# Patient Record
Sex: Female | Born: 1980 | Hispanic: Yes | Smoking: Former smoker
Health system: Southern US, Community
[De-identification: ages and names within clinical notes are randomized; demographics above are authoritative.]

## PROBLEM LIST (undated history)

## (undated) ENCOUNTER — Inpatient Hospital Stay (HOSPITAL_COMMUNITY): Payer: Self-pay

## (undated) DIAGNOSIS — J45909 Unspecified asthma, uncomplicated: Secondary | ICD-10-CM

## (undated) DIAGNOSIS — Z789 Other specified health status: Secondary | ICD-10-CM

## (undated) HISTORY — PX: NO PAST SURGERIES: SHX2092

## (undated) HISTORY — DX: Unspecified asthma, uncomplicated: J45.909

---

## 2012-03-25 ENCOUNTER — Ambulatory Visit (INDEPENDENT_AMBULATORY_CARE_PROVIDER_SITE_OTHER): Payer: Self-pay | Admitting: Emergency Medicine

## 2012-03-25 VITALS — BP 121/84 | HR 71 | Temp 98.3°F | Resp 16 | Ht 60.25 in | Wt 117.0 lb

## 2012-03-25 DIAGNOSIS — D229 Melanocytic nevi, unspecified: Secondary | ICD-10-CM

## 2012-03-25 DIAGNOSIS — D239 Other benign neoplasm of skin, unspecified: Secondary | ICD-10-CM

## 2012-03-25 NOTE — Progress Notes (Signed)
  Subjective:    Patient ID: Meghan Reeves, female    DOB: 10/20/1981, 31 y.o.   MRN: [id number]  HPI patient here with a two-year history of a mole on the left side of her face. She is here requesting removal of the mole the    Review of Systems     Objective:   Physical Exam physical exam reveals a 0.6 x 0.7 cm lesion of the left cheek.        Assessment & Plan:  Patient referred to dermatologist for removal

## 2013-11-22 ENCOUNTER — Ambulatory Visit: Payer: Self-pay

## 2015-07-13 ENCOUNTER — Ambulatory Visit (INDEPENDENT_AMBULATORY_CARE_PROVIDER_SITE_OTHER): Payer: Self-pay | Admitting: Family Medicine

## 2015-07-13 VITALS — BP 112/68 | HR 77 | Temp 97.9°F | Resp 17 | Ht 61.0 in | Wt 130.0 lb

## 2015-07-13 DIAGNOSIS — Z720 Tobacco use: Secondary | ICD-10-CM

## 2015-07-13 DIAGNOSIS — J069 Acute upper respiratory infection, unspecified: Secondary | ICD-10-CM

## 2015-07-13 DIAGNOSIS — R112 Nausea with vomiting, unspecified: Secondary | ICD-10-CM

## 2015-07-13 DIAGNOSIS — N910 Primary amenorrhea: Secondary | ICD-10-CM

## 2015-07-13 DIAGNOSIS — L700 Acne vulgaris: Secondary | ICD-10-CM

## 2015-07-13 DIAGNOSIS — N926 Irregular menstruation, unspecified: Secondary | ICD-10-CM

## 2015-07-13 DIAGNOSIS — F17200 Nicotine dependence, unspecified, uncomplicated: Secondary | ICD-10-CM

## 2015-07-13 LAB — POCT URINE PREGNANCY: PREG TEST UR: NEGATIVE

## 2015-07-13 MED ORDER — TRETINOIN 0.025 % EX GEL: Freq: Every day | CUTANEOUS | Status: DC

## 2015-07-13 MED ORDER — CLINDAMYCIN PHOSPHATE 1 % EX GEL: Freq: Two times a day (BID) | CUTANEOUS | Status: DC

## 2015-07-13 MED ORDER — PROMETHAZINE HCL 25 MG PO TABS: ORAL_TABLET | ORAL | Status: DC

## 2015-07-13 NOTE — Progress Notes (Addendum)
Nausea Subjective:  Patient ID: Meghan Reeves, female    DOB: 05-02-1981  Age: 34 y.o. MRN: [id number]  34 year old lady who comes in today complaining of nausea. This is Saturday. She vomited on Monday. She is persisted with having nausea. She has some throat irritation. Mild upper respiratory congestion and cough. She is a cigarette smoker, and says she would like something to help her get off of that. She is not employed. She has a boyfriend. She is on birth control pills. Last menstrual cycle was the middle of August. She does not have children. She has not had any more vomiting. No problems with her bowels or kidneys.  She is from [place] originally, has been in the [place] for 15 years. She speaks broken [place].   Objective:   Pleasant lady, healthy appearing, in no major distress. Her TMs are normal. Throat clear. Neck supple without significant nodes. Chest is clear to auscultation. Heart regular without murmurs gallops or arrhythmias. Abdomen is soft without masses or tenderness. No rashes. She denies having been febrile. Results for orders placed or performed in visit on 07/13/15  POCT urine pregnancy  Result Value Ref Range   Preg Test, Ur Negative Negative    Assessment & Plan:   Assessment:  Nonspecific nausea Probable mild URI or allergic rhinitis Tobacco use disorder  Plan:  Urged her to quit smoking Treat nausea symptomatically Major workup does not appear to be indicated at this time. Pregnancy test is negative.  Addendum: Patient wanted a refill on medications for her acne. I do not know what she took. She had a cream and a oral. I will treat her with some Retin-A.  There are no Patient Instructions on file for this visit.   HOPPER,DAVID, MD 07/13/2015

## 2015-07-13 NOTE — Addendum Note (Signed)
Addended by: Chastin Garlitz H on: 07/13/2015 10:21 AM   Modules accepted: Orders

## 2015-07-13 NOTE — Patient Instructions (Addendum)
Take promethazine 25 mg one half to one pill every 6 or 8 hours only when needed for nausea. This may cause you to feel a little sleepy or drowsy.  Take Claritin (loratadine) one daily if needed for headache congestion or runny nose  [employer] Spanish: 1-855-Dejelo-Ya ([phone])    Tomar prometazina 25 mg una mitad a una pastilla cada 6 u 8 horas solo cuando sea necesario para controlar las nuseas. Esto puede causar que se sienta un poco sueo o est aturtido.   Tomar Claritin (loratadine) uno diario si es necesario para el dolor de cabeza congestin o secrecin nasal   [employer]  Espaol: 1-855-Dejelo-Ya [phone])   Use the Retin-A gel on the face at bedtime.  Use the clindamycin gel on the face twice daily  Wash the face with soap and water twice daily   Utilice el gel Retin-A en la cara antes de acostarse.   Usar la clindamicina gel en [place] con jabn y [place]

## 2015-07-29 ENCOUNTER — Ambulatory Visit (INDEPENDENT_AMBULATORY_CARE_PROVIDER_SITE_OTHER): Payer: Self-pay | Admitting: Urgent Care

## 2015-07-29 VITALS — BP 102/70 | HR 74 | Temp 98.7°F | Resp 16 | Ht 61.0 in | Wt 130.4 lb

## 2015-07-29 DIAGNOSIS — F17200 Nicotine dependence, unspecified, uncomplicated: Secondary | ICD-10-CM

## 2015-07-29 DIAGNOSIS — Z308 Encounter for other contraceptive management: Secondary | ICD-10-CM

## 2015-07-29 DIAGNOSIS — Z720 Tobacco use: Secondary | ICD-10-CM

## 2015-07-29 DIAGNOSIS — Z3041 Encounter for surveillance of contraceptive pills: Secondary | ICD-10-CM

## 2015-07-29 DIAGNOSIS — Z30013 Encounter for initial prescription of injectable contraceptive: Secondary | ICD-10-CM | POA: Insufficient documentation

## 2015-07-29 MED ORDER — MEDROXYPROGESTERONE ACETATE 150 MG/ML IM SUSP
150.0000 mg | Freq: Once | INTRAMUSCULAR | Status: AC
Start: 2015-07-29 — End: 2015-07-29
  Administered 2015-07-29: 150 mg via INTRAMUSCULAR

## 2015-07-29 NOTE — Patient Instructions (Addendum)
Medroxyprogesterone injection [Contraceptive] Qu es este medicamento? Las inyecciones anticonceptivas de MEDROXIPROGESTERONA previenen Water quality scientist. Las [employer] brindarn control de la natalidad durante 3 meses. La Depo-subQ Provera 104 se utiliza tambin para tratar [employer] relacionado con endometriosis. Este medicamento puede ser utilizado para otros usos; si tiene alguna pregunta consulte con su proveedor de atencin mdica o con su farmacutico. MARCAS COMERCIALES DISPONIBLES: Depo-Provera, Depo-subQ Provera 104 Qu le debo informar a mi profesional de la salud antes de tomar este medicamento? Necesita saber si usted presenta alguno de los siguientes problemas o situaciones: -si consume alcohol con frecuencia -asma -enfermedad vascular o antecedente de cogulos sanguneos en los pulmones o las piernas -enfermedad de los [place], como osteoporosis -cncer de mama -diabetes -trastornos de la alimentacin (anorexia nerviosa o bulimia) -alta presin sangunea -infecciones por VIH o SIDA -enfermedad renal -enfermedad heptica -depresin mental -migraa -convulsiones -derrame cerebral -fuma tabaco -sangrado vaginal -una reaccin alrgica o inusual a la medroxiprogesterona, otras hormonas, otros medicamentos, alimentos, colorantes o conservantes -si est embarazada o buscando quedar embarazada -si est amamantando a un beb Cmo debo utilizar este medicamento? El anticonceptivo de Depo-Provera se inyecta por va intramuscular. La Depo-SubQ Provera 104 se inyecta por va subcutnea. Las [employer] un profesional de Technical sales engineer. Usted no puede estar embarazada antes de recibir una inyeccin. La inyeccin normalmente se aplica durante los primeros 5 das despus de comenzar un perodo menstrual o 6 semanas despus de un parto. Hable con su pediatra para informarse acerca del uso de este medicamento en nios. Puede requerir atencin especial. Estas inyecciones han sido usadas en nias  que han empezados a tener perodos [place]. Sobredosis: Pngase en contacto inmediatamente con un centro toxicolgico o una sala de urgencia si usted cree que haya tomado demasiado medicamento. ATENCIN: [employer] es solo para usted. No comparta este medicamento con nadie. Qu sucede si me olvido de una dosis? Trate de no olvidar ninguna dosis. Para mantener el control de natalidad necesitar una inyeccin cada 3 meses. Si no puede asistir a una cita, comunquese con su profesional de la salud para que se la [place]. Si espera ms de 13 semanas entre las inyecciones anticonceptivos de Depo-Provera o ms de 14 semanas entre inyecciones anticonceptivos de Depo-subQ Provera 104, puede quedarse [place]. Si no puede asistir a su cita utilice otro mtodo anticonceptivo. Tal vez deba hacerse una prueba de embarazo antes de recibir otra inyeccin. Qu puede interactuar con este medicamento? No tome esta medicina con ninguno de los siguientes medicamentos: -bosentano Esta medicina tambin puede interactuar con los siguientes medicamentos: -aminoglutethimide -antibiticos o medicamentos para infecciones, especialmente rifampicina, rifabutina, rifapentina y griseofulvina -aprepitant -barbitricos, tales como el fenobarbital o primidona -bexaroteno -carbamazepina -medicamentos para convulsiones, tales como etotona, felbamato, [place], [place], topiramato -modafinilo -hierba de [place] Puede ser que esta lista no menciona todas las posibles interacciones. Informe a su profesional de [employer]	[place] de [employer] productos a base de hierbas, medicamentos de [place] o suplementos nutritivos que est tomando. Si usted fuma, consume bebidas alcohlicas o si utiliza drogas ilegales, indqueselo tambin a su profesional de [employer]	[place]. Algunas sustancias pueden interactuar con su medicamento. A qu debo estar atento al usar [employer]? Este medicamento no la protege de la infeccin por VIH  (SIDA) ni de otras enfermedades de transmisin sexual. El uso de este producto puede provocar una prdida de calcio de sus huesos. La prdida de calcio puede provocar huesos dbiles (osteoporosis). Slo use este producto durante ms de 2 aos si otras formas  de anticonceptivos no son apropiados para usted. Mientre ms tiempo use este producto para el control de la natalidad, tendr ms riesgo de Insurance risk surveyor de [employer]. Consulte a su profesional de Pharmacist, hospital de cmo puede [employer] Corporation fuertes. Puede experimentar un cambio en el patrn de sangrado o periodos irregulares. Muchas mujeres dejan de tener periodos [employer]. Si recibe sus inyecciones a tiempo, la posibilidad de quedarse embarazada es muy baja. Si cree que podr [employer], visite a su profesional de la salud lo antes posible. Si desea quedar embarazada dentro del prximo ao, informe a su profesional de [employer]	[place]. El [place] de este medicamento puede perdurar durante mucho tiempo despus de recibir su ltima inyeccin. Qu efectos secundarios puedo tener al [employer] este medicamento? Efectos secundarios que debe informar a su mdico o a Barrister's clerk de la salud tan pronto como sea posible: -Chief of Staff como erupcin cutnea, picazn o urticarias, hinchazn de la cara, labios o lengua -secreciones o sensibilidad de las mamas -problemas respiratorios -cambios en la visin -depresin -sensacin de desmayos o mareos, cadas -fiebre -dolor en el abdomen, pecho, entrepierna o piernas -problemas de coordinacin, del habla, al caminar -cansancio o debilidad inusual -color amarillento de los ojos o la piel Efectos secundarios que, por lo general, no requieren Geophysical data processor (debe informarlos a mdico o a Barrister's clerk de la salud si persisten o si son molestos): -cne -retencin de lquidos e hinchazn -dolor de cabeza -perodos menstruales irregulares, manchando o ausencia de perodos  menstruales -dolor, picazn o reaccin cutnea temporal en el lugar de la inyeccin -aumento de peso Puede ser que esta lista no menciona todos los posibles efectos secundarios. Comunquese a su mdico por asesoramiento mdico [employer]. Usted puede informar los efectos secundarios a la FDA por telfono al 1-800-FDA-1088. Dnde debo guardar mi medicina? No se aplica en este caso. Un profesional de Probation officer las inyecciones. ATENCIN: Este folleto es un resumen. Puede ser que no cubra toda la posible informacin. Si usted tiene preguntas acerca de esta medicina, consulte con su mdico, su farmacutico o su profesional de Technical sales engineer.  2015, Elsevier/Gold Standard. (2009-01-07 15:09:00)

## 2015-07-29 NOTE — Progress Notes (Signed)
    MRN: [id number] DOB: 10/19/1981  Subjective:   Meghan Reeves is a 34 y.o. female presenting for OCP management. Patient has been taking Sprintec, she is consistent with her medication. She had a normal Pap smear completed several months ago. Patient was counseled on her smoking, currently half pack per day and she is sure to cut back. She is aware of OCPs and the increased risk for clots if she is also smoking. She denies shortness of breath, chest pain, heart racing, lower leg swelling, redness in her calf, calf pain. Of note, patient is unwilling to stop OCPs to her smoking but she does not want to get pregnant and has to be discrete given that her husband does not agree with her here. She would like counseling on what her options are as far as OCP in the setting of her smoking. Denies any other aggravating or relieving factors, no other questions or concerns.  Meghan Reeves has a current medication list which includes the following prescription(s): ibuprofen-diphenhydramine cit and norgestimate-ethinyl estradiol triphasic. Also has No Known Allergies.  Meghan Reeves  has no past medical history on file. Also  has no past surgical history on file.  Objective:   Vitals: BP 102/70 mmHg  Pulse 74  Temp(Src) 98.7 F (37.1 C) (Oral)  Resp 16  Ht 5\' 1"  (1.549 m)  Wt 130 lb 6.4 oz (59.149 kg)  BMI 24.65 kg/m2  SpO2 99%  LMP 07/27/2015  Physical Exam  Assessment and Plan :   1. Initiation of Depo Provera 2. Encounter for birth control pills maintenance - Due to patient's smoking I recommended we switch her from OCP to the Depo injection which carries a much lower risk with her smoking and is still effective contraception. She agreed to this especially since this will be more discrete since she doesn't want to get pregnant and is unwilling to have a conversation with her husband about this. She plans on continuing efforts to stop smoking. Follow-up in 3 months for cardioprotection.  3. Tobacco use  disorder - Offered help with smoking cessation but patient declined this today.  Meghan Eagles, PA-C [facility] [phone] 07/29/2015 2:25 PM

## 2015-09-23 ENCOUNTER — Ambulatory Visit (INDEPENDENT_AMBULATORY_CARE_PROVIDER_SITE_OTHER): Payer: Self-pay | Admitting: Family Medicine

## 2015-09-23 VITALS — BP 108/76 | HR 74 | Temp 98.4°F | Resp 16 | Ht 61.0 in | Wt 130.6 lb

## 2015-09-23 DIAGNOSIS — H1132 Conjunctival hemorrhage, left eye: Secondary | ICD-10-CM

## 2015-09-23 DIAGNOSIS — N912 Amenorrhea, unspecified: Secondary | ICD-10-CM

## 2015-09-23 NOTE — Progress Notes (Signed)
[facility] [address] [place]  [address] [place]  Date:  09/23/2015   Name:  Meghan Reeves   DOB:  05/05/1981   MRN:  [id number]  PCP:  No PCP Per Patient    Chief Complaint: Eye Problem   History of Present Illness:  Meghan Reeves is a 34 y.o. very pleasant female patient who presents with the following:  She notes a problem with he left eye for 3 days - no pain, swelling or discharge but she notes that the eye appears red No known injury She has coughed and sneezed some recently Her vision is normal.  She does not use any corrective lenses.  No light sensitivity- the eye is just red No one else at home has this She otherwise feels well She does not have any medication alergy  As an aside she notes that since she started depo she has not had her period.  Her husband does not know she is on depo and is asking why she has not menstruated,  She is not sure what to do   Patient Active Problem List   Diagnosis Date Noted  . Initiation of Depo Provera 07/29/2015    History reviewed. No pertinent past medical history.  History reviewed. No pertinent past surgical history.  Social History  Substance Use Topics  . Smoking status: Current Every Day Smoker  . Smokeless tobacco: Never Used  . Alcohol Use: No    History reviewed. No pertinent family history.  No Known Allergies  Medication list has been reviewed and updated.  Current Outpatient Prescriptions on File Prior to Visit  Medication Sig Dispense Refill  . Ibuprofen-Diphenhydramine Cit (ADVIL PM PO) Take by mouth.     No current facility-administered medications on file prior to visit.    Review of Systems:  As per HPI- otherwise negative.   Physical Examination: Filed Vitals:   09/23/15 1126  BP: 108/76  Pulse: 74  Temp: 98.4 F (36.9 C)  Resp: 16   Filed Vitals:   09/23/15 1126  Height: 5\' 1"  (1.549 m)  Weight: 130 lb 9.6 oz (59.24 kg)   Body mass  index is 24.69 kg/(m^2). Ideal Body Weight: Weight in (lb) to have BMI = 25: 132  GEN: WDWN, NAD, Non-toxic, A & O x 3, looks well HEENT: Atraumatic, Normocephalic. Neck supple. No masses, No LAD.  Bilateral TM wnl, oropharynx normal.  PEERL,EOMI.   Small subconjunctival hemorrhage on the left cornea, nasal aspect Limited fundoscopic exam wnl Ears and Nose: No external deformity. CV: RRR, No M/G/R. No JVD. No thrill. No extra heart sounds. PULM: CTA B, no wheezes, crackles, rhonchi. No retractions. No resp. distress. No accessory muscle use. EXTR: No c/c/e NEURO Normal gait.  PSYCH: Normally interactive. Conversant. Not depressed or anxious appearing.  Calm demeanor.    Assessment and Plan: Subconjunctival hemorrhage of left eye  Amenorrhea  Reassured that her subconjunctival hemorrhage is nothing to be concerned about and will self- resolve.  She will let us know if this does not resolve soon or if she has any sx Reassured her that it is normal to not menstruate while on depo.  I am not sure what she should tell her husband, but suggested she let him know that she has seen the MD and been told she is not mensurating to a hormonal issue which is indeed true.    Signed Lamar Blinks, MD

## 2015-10-29 ENCOUNTER — Ambulatory Visit (INDEPENDENT_AMBULATORY_CARE_PROVIDER_SITE_OTHER): Payer: Self-pay

## 2015-10-29 DIAGNOSIS — Z3042 Encounter for surveillance of injectable contraceptive: Secondary | ICD-10-CM

## 2015-10-29 LAB — POCT URINE PREGNANCY: Preg Test, Ur: NEGATIVE

## 2015-10-29 MED ORDER — MEDROXYPROGESTERONE ACETATE 150 MG/ML IM SUSY
150.0000 mg | PREFILLED_SYRINGE | Freq: Once | INTRAMUSCULAR | Status: AC
  Administered 2015-10-29: 150 mg via INTRAMUSCULAR

## 2015-10-29 MED ORDER — MEDROXYPROGESTERONE ACETATE 150 MG/ML IM SUSP: 150.0000 mg | Freq: Once | INTRAMUSCULAR | Status: DC

## 2015-10-29 NOTE — Progress Notes (Signed)
Pt one day late for Depo Provera. HCG done. Negative. Shot given and pt notified when to RTC for next injection

## 2016-01-28 ENCOUNTER — Ambulatory Visit (INDEPENDENT_AMBULATORY_CARE_PROVIDER_SITE_OTHER): Payer: Self-pay | Admitting: *Deleted

## 2016-01-28 DIAGNOSIS — Z3042 Encounter for surveillance of injectable contraceptive: Secondary | ICD-10-CM

## 2016-01-28 MED ORDER — MEDROXYPROGESTERONE ACETATE 150 MG/ML IM SUSY
150.0000 mg | PREFILLED_SYRINGE | Freq: Once | INTRAMUSCULAR | Status: AC
  Administered 2016-01-28: 150 mg via INTRAMUSCULAR

## 2016-01-28 NOTE — Progress Notes (Signed)
   Subjective:    Patient ID: Meghan Reeves, female    DOB: 1981-04-25, 35 y.o.   MRN: [id number]  HPI Pt here for Depo Provera 150 mg injection only.    Review of Systems     Objective:   Physical Exam        Assessment & Plan:

## 2016-08-07 ENCOUNTER — Telehealth: Payer: Self-pay | Admitting: *Deleted

## 2016-08-07 ENCOUNTER — Telehealth: Payer: Self-pay

## 2016-08-07 ENCOUNTER — Ambulatory Visit (INDEPENDENT_AMBULATORY_CARE_PROVIDER_SITE_OTHER): Payer: Self-pay | Admitting: Family Medicine

## 2016-08-07 VITALS — BP 110/80 | HR 70 | Temp 98.0°F | Resp 16 | Ht 61.0 in | Wt 141.0 lb

## 2016-08-07 DIAGNOSIS — R42 Dizziness and giddiness: Secondary | ICD-10-CM

## 2016-08-07 DIAGNOSIS — Z3169 Encounter for other general counseling and advice on procreation: Secondary | ICD-10-CM

## 2016-08-07 DIAGNOSIS — R350 Frequency of micturition: Secondary | ICD-10-CM

## 2016-08-07 DIAGNOSIS — F17200 Nicotine dependence, unspecified, uncomplicated: Secondary | ICD-10-CM

## 2016-08-07 DIAGNOSIS — N910 Primary amenorrhea: Secondary | ICD-10-CM

## 2016-08-07 DIAGNOSIS — N926 Irregular menstruation, unspecified: Secondary | ICD-10-CM

## 2016-08-07 LAB — POC MICROSCOPIC URINALYSIS (UMFC): Mucus: ABSENT

## 2016-08-07 LAB — POCT URINALYSIS DIP (MANUAL ENTRY)
BILIRUBIN UA: NEGATIVE
BILIRUBIN UA: NEGATIVE
Blood, UA: NEGATIVE
GLUCOSE UA: NEGATIVE
Leukocytes, UA: NEGATIVE
Nitrite, UA: NEGATIVE
Protein Ur, POC: NEGATIVE
SPEC GRAV UA: 1.01
Urobilinogen, UA: 0.2
pH, UA: 6.5

## 2016-08-07 LAB — POCT URINE PREGNANCY: Preg Test, Ur: NEGATIVE

## 2016-08-07 MED ORDER — PNV PRENATAL PLUS MULTIVIT+DHA 27-1 & 312 MG PO MISC: 1.0000 | Freq: Every day | ORAL | 3 refills | Status: DC

## 2016-08-07 NOTE — Progress Notes (Signed)
By signing my name below I, Tereasa Coop, attest that this documentation has been prepared under the direction and in the presence of Delman Cheadle, MD. Electonically Signed. Tereasa Coop, Scribe 08/07/2016 at 11:13 AM  Subjective:    Patient ID: Meghan Reeves, female    DOB: [birth year], 35 y.o.   MRN: [id number]  Chief Complaint  Patient presents with  . pregnancy test    HPI Meghan Reeves is a 35 y.o. female who presents to the [facility] for pregnancy test. Pt states that she has been trying to get pregnant for the past 3 months. Pt has been taking depo-provera shots for birth control. Pt's last depo-provera shot was 3 months ago.   Pt denies any prior pregnancy. Pt denies any prior attempt to get pregnant. Pt denies taking daily vitamins.  Pt has been feeling nauseous and dizzy for the past 8 days. dizziness is intermittent. Pt is dizzy currently. Pt wakes up feeling nauseous which remains constant throughout the day. Pt denies constipation, diarrhea, vomiting, or abd pain. Pt has not been eating well due to the nausea. Pt also denies any breast pain/tenderness. Pt reports urinary frequency. Pt denies any dysuria, vaginal pain, vaginal discharge or pelvic pain.   Pt has not drank any fluids today.   LNMP was 06/15/16. Pt's periods have been regular for the past 3 months. Pt's periods were also regular prior to starting depo-provera shots.   Pt denies regular exercise. Pt denies drinking alcohol.   Pt is an active smoker. Pt smoke on average 10 cigarettes a day. Pt states that she could be willing to quit, but currently does not want to.   Pt reports family history of asthma.   Pt is spanish speaking and professional medical translator present throughout history and exam.   There are no active problems to display for this patient.   No current outpatient prescriptions on file prior to visit.   No current facility-administered medications on file prior to  visit.     No Known Allergies  Depression screen [facility] 2/9 08/07/2016 07/13/2015  Decreased Interest 0 1  Down, Depressed, Hopeless 0 1  PHQ - 2 Score 0 2  Altered sleeping - 1  Tired, decreased energy - 1  Change in appetite - 1  Trouble concentrating - 1  Moving slowly or fidgety/restless - 1  Suicidal thoughts - 0  PHQ-9 Score - 7  Difficult doing work/chores - Somewhat difficult       Review of Systems  Constitutional: Negative for fever.  HENT: Negative.   Eyes: Negative.   Respiratory: Negative.   Cardiovascular: Negative.   Gastrointestinal: Positive for nausea. Negative for abdominal pain, constipation, diarrhea and vomiting.  Genitourinary: Positive for frequency. Negative for dysuria, flank pain, hematuria, urgency, vaginal discharge and vaginal pain.  Musculoskeletal: Negative.   Skin: Negative.   Neurological: Positive for dizziness.  Psychiatric/Behavioral: Negative.        Objective:   Physical Exam  Constitutional: She is oriented to person, place, and time. She appears well-developed and well-nourished. No distress.  HENT:  Head: Normocephalic and atraumatic.  Eyes: Conjunctivae are normal. Pupils are equal, round, and reactive to light.  Neck: Neck supple. No thyromegaly present.  Cardiovascular: Normal rate.   Pulmonary/Chest: Effort normal.  Abdominal: Soft. Normal appearance and bowel sounds are normal. She exhibits no distension and no mass. There is no tenderness. There is no rebound, no guarding and no CVA tenderness.  Musculoskeletal: Normal range of  motion.  Neurological: She is alert and oriented to person, place, and time.  Skin: Skin is warm and dry.  Psychiatric: She has a normal mood and affect. Her behavior is normal.  Nursing note and vitals reviewed.  BP 110/80 (BP Location: Right Arm, Patient Position: Sitting, Cuff Size: Normal)   Pulse 70   Temp 98 F (36.7 C) (Oral)   Resp 16   Ht 5\' 1"  (1.549 m)   Wt 141 lb (64 kg)   LMP  06/15/2016 (Approximate)   SpO2 99%   BMI 26.64 kg/m   Orthostatics negative.   Results for orders placed or performed in visit on 08/07/16  POCT urine pregnancy  Result Value Ref Range   Preg Test, Ur Negative Negative  POCT urinalysis dipstick  Result Value Ref Range   Color, UA yellow yellow   Clarity, UA clear clear   Glucose, UA negative negative   Bilirubin, UA negative negative   Ketones, POC UA negative negative   Spec Grav, UA 1.010    Blood, UA negative negative   pH, UA 6.5    Protein Ur, POC negative negative   Urobilinogen, UA 0.2    Nitrite, UA Negative Negative   Leukocytes, UA Negative Negative  POCT Microscopic Urinalysis ([facility])  Result Value Ref Range   WBC,UR,HPF,POC None None WBC/hpf   RBC,UR,HPF,POC None None RBC/hpf   Bacteria None None, Too numerous to count   Mucus Absent Absent   Epithelial Cells, UR Per Microscopy Many (A) None, Too numerous to count cells/hpf       Assessment & Plan:   1. Late period   2. Encounter for preconception consultation   3. Tobacco use disorder   4. Urinary frequency   5. Dizziness    Used Patent attorney. Advised smoking cessation and starting pnv prior to conception. Pt is having continuous nausea for the past wk and her menses is >1 wk late. However, as she just received her last Depo-Provera injection a little over 3 months ago I think it is more likely that her sxs are due to her coming off the Depo rather than early pregnancy.  Offered w/u of these sxs but as pt is self-pay, she elects for watchful waiting right now and will  RTC for further eval if sxs cont.  Orders Placed This Encounter  Procedures  . Orthostatic vital signs  . POCT urine pregnancy  . POCT urinalysis dipstick  . POCT Microscopic Urinalysis ([facility])    Meds ordered this encounter  Medications  . Prenatal Vit-Fe Fum-FA-Omega (PNV PRENATAL PLUS MULTIVIT+DHA) 27-1 & 312 MG MISC    Sig: Take 1 tablet by mouth daily.    Dispense:  90  each    Refill:  3    I personally performed the services described in this documentation, which was scribed in my presence. The recorded information has been reviewed and considered, and addended by me as needed.   Delman Cheadle, M.D.  [facility] [address] [place], [place] [address] [phone] phone [phone] fax  08/10/16 12:46 PM

## 2016-08-07 NOTE — Telephone Encounter (Signed)
Marya Amsler (the pharmacist) called from [place] wanting to know if pt needs this specific Prenatal Vit-Fe Fum-FA-Omega (PNV PRENATAL PLUS MULTIVIT+DHA) 27-1 & 312 MG MISC [id number] or if he can give her a similar medication. CB for Marya Amsler is [phone]

## 2016-08-07 NOTE — Patient Instructions (Addendum)
Preparacin para Water quality scientist (Preparing for Pregnancy) Antes de intentar quedar embarazada, haga una cita con el mdico (atencin previa a la concepcin). El objetivo es ayudarla a que tenga un embarazo saludable y sin Engineer, manufacturing. En la primera cita, el mdico:   Har un examen fsico completo, incluido un [place].  Har una historia clnica completa.  La aconsejar y la ayudar a Scientist, research (physical sciences) problema. LISTA DE VERIFICACIN PREVIA A LA CONCEPCIN A continuacin se incluye una lista de los temas bsicos que debe abarcar con el mdico en la visita previa a la concepcin:  Su historia clnica.  Informe al [employer] que padeci. Muchas de ellas pueden afectar el embarazo.  Incluya la historia clnica y los antecedentes familiares de su pareja.  Asegrese de haberse hecho estudios de deteccin de las enfermedades de transmisin sexual (ETS). Estas pueden afectar el [place], y, en algunos casos, pueden transmitirse al beb. Informe al mdico si tiene antecedentes de ETS.  Informe al mdico sobre cualquier problema previo que haya tenido en relacin con la concepcin o el embarazo.  Informe al [employer] que toma, entre ellos, los suplementos herbales y los medicamentos de [place].  Asegrese de [place]. Tal vez deba concertar ms citas.  Pregntele al mdico si debe recibir alguna vacuna o si hay alguna que debe evitar.  Dieta  Es muy importante seguir Ardelia Mems dieta equilibrada y saludable que contenga los nutrientes adecuados durante el [place].  Pdale al mdico que la ayude a Science writer un peso saludable antes del [place].  Si tiene sobrepeso, tiene ms riesgo de sufrir ciertas complicaciones, que incluyen hipertensin arterial, diabetes y Environmental education officer.  Si tiene [employer], es ms propensa a Best boy un beb con bajo peso al [employer].  Estilo de vida  Informe al [employer] cuestiones  relacionadas con el estilo de vida, por ejemplo, si consume alcohol o drogas, o si fuma.  Describa las sustancias dainas a las que puede estar expuesta en el trabajo o en su casa, entre otras, sustancias qumicas, plaguicidas y radiacin.  Salud mental  Informe al mdico si se ha sentido deprimida o ansiosa.  Informe al mdico si tiene antecedentes de consumo de drogas.  Informe al mdico si no se siente segura en su casa. INSTRUCCIONES PARA PREPARARSE PARA EL EMBARAZO EN EL HOGAR. Siga las indicaciones y los consejos del mdico.   Lleve un registro preciso de las [place], para que el mdico pueda determinar la fecha probable de parto con ms facilidad.  Empiece a tomar vitaminas prenatales y suplementos con cido flico diariamente. Tmelos como se lo haya indicado el mdico.  Consuma una dieta equilibrada. Pida ayuda a un asesor en nutricin si tiene preguntas o necesita ayuda.  Realice actividad fsica con regularidad. Intente realizar al menos 71minutos de [place] fsica todos o [employer].  Si fuma, deje de hacerlo.  No beba alcohol.  No consuma drogas.  Mantenga controlados los problemas mdicos, como la diabetes o hipertensin arterial.  Si tiene diabetes, haga lo siguiente:  Realice controles adecuados de la concentracin de Dispensing optician. Si tiene diabetes tipo1, aplquese varias dosis diarias de insulina. No use insulina en dosis dividida ni insulina premezclada.  Consulte a un oftalmlogo especialista en diabetes para que le realice un examen ocular.  El mdico debe evaluarla para detectar la presencia de enfermedades cardiovasculares.  Alcance un peso saludable. Si tiene sobrepeso u obesidad, adelgace con  la ayuda de un profesional mdico calificado, por ejemplo, un nutricionista matriculado. Pregntele al mdico cul es el rango de peso adecuado para usted. [place]? Puede estar embarazada si ha [employer] y no tuvo la [place]. Los sntomas de embarazo incipiente incluyen:   [place] leves.  Sangrado vaginal muy leve (manchado).  Cansancio poco frecuente.  Nuseas matinales. Si tiene alguno de [employer], hgase una prueba de embarazo casera. El objetivo de estas pruebas es Hydrographic surveyor la presencia de una hormona llamada gonadotropina corinica humana [facility]) en la orina. El organismo comienza a producir esta hormona al principio del Media planner. Estas pruebas son muy precisas. Espere por lo menos Management consultant de retraso de la [place]. Si el resultado es positivo, llame al mdico para Manufacturing systems engineer las citas para la atencin prenatal. QU DEBO HACER SI QUEDO EMBARAZADA?  Haga una cita con el mdico para la semana12 del [place], a ms tardar.  No fume. Fumar puede causar daos al beb.  No consuma bebidas alcohlicas. El alcohol se relaciona con ciertos defectos congnitos.  Evite los olores y las sustancias qumicas txicas.  Puede seguir teniendo [employer] si no le causan dolor u otros problemas, por ejemplo, sangrado vaginal.   Esta informacin no tiene Marine scientist el consejo del mdico. Asegrese de hacerle al mdico cualquier pregunta que tenga.   Document Released: 10/31/2013 Elsevier Interactive Patient Education 2016 [place] (Pregnancy and Smoking) Fumar durante el embarazo es daino para usted y el beb. La nicotina, una sustancia [place], el monxido de carbono y [place] otras sustancias txicas que usted inhala de un cigarrillo son transportadas a travs del torrente sanguneo hasta el beb. El humo del cigarrillo contiene ms de 2500sustancias qumicas. Se desconoce cul de ellas es nociva para el beb. Sin embargo, tanto la nicotina como el monxido de carbono causan problemas de Best boy. Fumar durante el embarazo aumenta el riesgo de:  Defectos congnitos en el beb, incluso defectos  cardacos.  Aborto espontneo y muerte fetal.  Nacimiento antes de completar las 37semanas de embarazo (parto prematuro).  Embarazo fuera del tero (embarazo ectpico).  Recubrimiento de la placenta sobre la abertura del tero (placenta previa).  Desprendimiento de la placenta antes del nacimiento del beb (desprendimiento placentario).  Rotura de la bolsa de lquido amnitico antes del trabajo de parto (ruptura prematura de las [place]). CMO AFECTA AL BEB [place]? Antes del nacimiento Fumar durante el embarazo:  Reduce el flujo sanguneo y el oxgeno que recibe el beb.  Aumenta la frecuencia cardaca del beb.  Reduce el desarrollo del beb dentro del tero (retraso del crecimiento intrauterino). Despus del nacimiento Los bebs que nacen de mujeres que fumaron durante el embarazo tienen ms probabilidades de tener un bajo peso al nacer. Tambin tienen riesgo de:  Problemas de salud graves, discapacidad crnica o Agricultural engineer (parlisis cerebral, retraso mental, problemas de aprendizaje) y [place].  Sndrome de muerte sbita del lactante (SMSL).  Problemas respiratorios y pulmonares. [place]?  Pdale ayuda a su mdico para dejar de fumar. Encontrar los siguientes recursos disponibles:  Psicoterapia.  Tratamiento psicolgico  Acupuntura.  Intervencin familiar.  Hipnosis.  Los suplementos de nicotina an no han sido estudiados como para saber si son seguros Solicitor. Debera considerarlos solamente cuando hayan fallado todos los dems mtodos, y deber usarlos bajo la supervisin atenta de su mdico.  Lneas telefnicas  de [place]. La lnea gratuita nacional en [place] para dejar de fumar es 1-800-QUIT NOW. PARA OBTENER MS INFORMACIN  Sociedad Estadounidense del Cncer ([place]): www.cancer.org  Asociacin Estadounidense del [place]n (American Heart  Association): www.heart.[place] ([place] ): www.cancer.gov  March of Dimes: www.marchofdimes.org   Esta informacin no tiene Marine scientist el consejo del mdico. Asegrese de hacerle al mdico cualquier pregunta que tenga.   Document Released: 04/13/2008 Document Revised: 10/31/2013 Elsevier Interactive Patient Education [employer].

## 2016-08-07 NOTE — Telephone Encounter (Signed)
Patient wanted Dr Brigitte Pulse to give her something for her nose and throat. I was advised by the doctor to tell patient to get Tylenol Sinus over the counter. Patient was called and advised.

## 2016-08-10 NOTE — Telephone Encounter (Signed)
Called pharm and OKd change to the prenatal vit that they stock.

## 2016-09-09 HISTORY — PX: APPENDECTOMY: SHX54

## 2016-09-28 ENCOUNTER — Inpatient Hospital Stay (HOSPITAL_COMMUNITY)
Admission: AD | Admit: 2016-09-28 | Discharge: 2016-10-01 | DRG: 340 | Disposition: A | Discharge status: Home / Self Care | Payer: Self-pay | Source: Ambulatory Visit | Attending: General Surgery | Admitting: General Surgery

## 2016-09-28 ENCOUNTER — Inpatient Hospital Stay (HOSPITAL_COMMUNITY): Payer: Self-pay

## 2016-09-28 ENCOUNTER — Encounter (HOSPITAL_COMMUNITY): Payer: Self-pay

## 2016-09-28 DIAGNOSIS — Z825 Family history of asthma and other chronic lower respiratory diseases: Secondary | ICD-10-CM

## 2016-09-28 DIAGNOSIS — D72829 Elevated white blood cell count, unspecified: Secondary | ICD-10-CM

## 2016-09-28 DIAGNOSIS — IMO0001 Reserved for inherently not codable concepts without codable children: Secondary | ICD-10-CM

## 2016-09-28 DIAGNOSIS — K3533 Acute appendicitis with perforation and localized peritonitis, with abscess: Secondary | ICD-10-CM | POA: Diagnosis present

## 2016-09-28 DIAGNOSIS — K352 Acute appendicitis with generalized peritonitis: Principal | ICD-10-CM | POA: Diagnosis present

## 2016-09-28 DIAGNOSIS — K3580 Unspecified acute appendicitis: Secondary | ICD-10-CM

## 2016-09-28 DIAGNOSIS — F17210 Nicotine dependence, cigarettes, uncomplicated: Secondary | ICD-10-CM | POA: Diagnosis present

## 2016-09-28 HISTORY — DX: Other specified health status: Z78.9

## 2016-09-28 LAB — CBC
HCT: 41.5 % (ref 36.0–46.0)
Hemoglobin: 14.9 g/dL (ref 12.0–15.0)
MCH: 32.2 pg (ref 26.0–34.0)
MCHC: 35.9 g/dL (ref 30.0–36.0)
MCV: 89.6 fL (ref 78.0–100.0)
PLATELETS: 280 10*3/uL (ref 150–400)
RBC: 4.63 MIL/uL (ref 3.87–5.11)
RDW: 12.4 % (ref 11.5–15.5)
WBC: 18.7 10*3/uL — AB (ref 4.0–10.5)

## 2016-09-28 LAB — URINALYSIS, ROUTINE W REFLEX MICROSCOPIC
BILIRUBIN URINE: NEGATIVE
Glucose, UA: NEGATIVE mg/dL
Hgb urine dipstick: NEGATIVE
KETONES UR: NEGATIVE mg/dL
LEUKOCYTES UA: NEGATIVE
NITRITE: NEGATIVE
PH: 5.5 (ref 5.0–8.0)
PROTEIN: NEGATIVE mg/dL
Specific Gravity, Urine: >1.03 — ABNORMAL HIGH (ref 1.005–1.030)

## 2016-09-28 LAB — POCT PREGNANCY, URINE: PREG TEST UR: NEGATIVE

## 2016-09-28 LAB — LIPASE, BLOOD: LIPASE: 17 U/L (ref 11–51)

## 2016-09-28 LAB — COMPREHENSIVE METABOLIC PANEL
ALT: 17 U/L (ref 14–54)
AST: 21 U/L (ref 15–41)
Albumin: 4.2 g/dL (ref 3.5–5.0)
Alkaline Phosphatase: 81 U/L (ref 38–126)
Anion gap: 8 (ref 5–15)
BUN: 11 mg/dL (ref 6–20)
CHLORIDE: 107 mmol/L (ref 101–111)
CO2: 22 mmol/L (ref 22–32)
CREATININE: 0.85 mg/dL (ref 0.44–1.00)
Calcium: 9.1 mg/dL (ref 8.9–10.3)
GFR calc non Af Amer: >60 mL/min (ref >60)
Glucose, Bld: 108 mg/dL — ABNORMAL HIGH (ref 65–99)
Potassium: 4 mmol/L (ref 3.5–5.1)
SODIUM: 137 mmol/L (ref 135–145)
Total Bilirubin: 0.4 mg/dL (ref 0.3–1.2)
Total Protein: 7.2 g/dL (ref 6.5–8.1)

## 2016-09-28 LAB — AMYLASE: Amylase: 92 U/L (ref 28–100)

## 2016-09-28 LAB — DIFFERENTIAL
Basophils Absolute: 0 10*3/uL (ref 0.0–0.1)
Basophils Relative: 0 %
Eosinophils Absolute: 0 10*3/uL (ref 0.0–0.7)
Eosinophils Relative: 0 %
LYMPHS PCT: 10 %
Lymphs Abs: 1.9 10*3/uL (ref 0.7–4.0)
MONO ABS: 0.4 10*3/uL (ref 0.1–1.0)
Monocytes Relative: 2 %
NEUTROS ABS: 15.9 10*3/uL — AB (ref 1.7–7.7)
Neutrophils Relative %: 87 %

## 2016-09-28 MED ORDER — ONDANSETRON 8 MG PO TBDP
8.0000 mg | ORAL_TABLET | Freq: Once | ORAL | Status: AC
  Administered 2016-09-28: 8 mg via ORAL
  Filled 2016-09-28: qty 1

## 2016-09-28 MED ORDER — KETOROLAC TROMETHAMINE 60 MG/2ML IM SOLN
60.0000 mg | Freq: Once | INTRAMUSCULAR | Status: AC
  Administered 2016-09-28: 60 mg via INTRAMUSCULAR
  Filled 2016-09-28: qty 2

## 2016-09-28 MED ORDER — IOPAMIDOL (ISOVUE-300) INJECTION 61%
30.0000 mL | INTRAVENOUS | Status: AC
  Administered 2016-09-28 (×2): 30 mL via ORAL

## 2016-09-28 MED ORDER — IOPAMIDOL (ISOVUE-300) INJECTION 61%
100.0000 mL | Freq: Once | INTRAVENOUS | Status: AC | PRN
Start: 2016-09-28 — End: 2016-09-28
  Administered 2016-09-28: 100 mL via INTRAVENOUS

## 2016-09-28 NOTE — MAU Note (Signed)
Pt c/o abdominal pain on her right side that starts in the upper quadrant and goes down to the lower right abdomen. The pain started today around 3pm. Pt states she has been vomiting since 5pm. Pt states she hasn't tried to eat anything today. Pt denies loose stools.

## 2016-09-28 NOTE — MAU Provider Note (Signed)
History     CSN: [id number]  Arrival date and time: 09/28/16 [id number]   First Provider Initiated Contact with Patient 09/28/16 2002      Chief Complaint  Patient presents with  . Abdominal Pain  . Emesis   Meghan Reeves is a 35 y.o. Female who presents with abdominal pain & vomiting that began this afternoon at 3 pm. Abdominal pain spreads from RUQ to RLQ.    Abdominal Pain  This is a new problem. The current episode started today. The onset quality is sudden. The problem occurs constantly. The problem has been unchanged. The pain is located in the RLQ and RUQ. The pain is at a severity of 8/10. The quality of the pain is cramping. The abdominal pain does not radiate. Associated symptoms include anorexia and vomiting. Pertinent negatives include no constipation, diarrhea, dysuria, fever, frequency or nausea. The pain is aggravated by palpation. The pain is relieved by nothing. Treatments tried: aspirin. The treatment provided no relief. There is no history of abdominal surgery, Crohn's disease, gallstones or irritable bowel syndrome.  Emesis   This is a new problem. The current episode started today. The problem occurs 5 to 10 times per day. The problem has been unchanged. The emesis has an appearance of bile. There has been no fever. Associated symptoms include abdominal pain and chills. Pertinent negatives include no diarrhea or fever. She has tried nothing for the symptoms.    History reviewed. No pertinent past medical history.  History reviewed. No pertinent surgical history.  History reviewed. No pertinent family history.  Social History  Substance Use Topics  . Smoking status: Current Every Day Smoker    Packs/day: 0.25    Years: 10.00    Types: Cigarettes  . Smokeless tobacco: Never Used  . Alcohol use No    Allergies: No Known Allergies  Prescriptions Prior to Admission  Medication Sig Dispense Refill Last Dose  . ibuprofen (ADVIL,MOTRIN) 200 MG tablet Take 600 mg  by mouth every 6 (six) hours as needed for moderate pain.   09/28/2016 at 0200  . Prenatal Vit-Fe Fum-FA-Omega (PNV PRENATAL PLUS MULTIVIT+DHA) 27-1 & 312 MG MISC Take 1 tablet by mouth daily. (Patient not taking: Reported on 09/28/2016) 90 each 3 Not Taking at Unknown time    Review of Systems  Constitutional: Positive for chills. Negative for fever.  Gastrointestinal: Positive for abdominal pain, anorexia and vomiting. Negative for constipation, diarrhea and nausea.  Genitourinary: Negative.  Negative for dysuria and frequency.   Physical Exam   Blood pressure 127/83, pulse 78, temperature 98.2 F (36.8 C), temperature source Oral, resp. rate 17, last menstrual period 09/08/2016, SpO2 99 %, unknown if currently breastfeeding.  Physical Exam  Nursing note and vitals reviewed. Constitutional: She is oriented to person, place, and time. She appears well-developed and well-nourished. No distress.  HENT:  Head: Normocephalic and atraumatic.  Eyes: Conjunctivae are normal. Right eye exhibits no discharge. Left eye exhibits no discharge. No scleral icterus.  Neck: Normal range of motion.  Cardiovascular: Normal rate, regular rhythm and normal heart sounds.   No murmur heard. Respiratory: Effort normal and breath sounds normal. No respiratory distress. She has no wheezes.  GI: Soft. Bowel sounds are normal. She exhibits no distension. There is tenderness in the right lower quadrant. There is guarding (RLQ). There is no rebound and negative Murphy's sign.  Neurological: She is alert and oriented to person, place, and time.  Skin: Skin is warm and dry. She is not diaphoretic.  Psychiatric: She has a normal mood and affect. Her behavior is normal. Judgment and thought content normal.    MAU Course  Procedures Results for orders placed or performed during the hospital encounter of 09/28/16 (from the past 24 hour(s))  Pregnancy, urine POC     Status: None   Collection Time: 09/28/16  7:37 PM   Result Value Ref Range   Preg Test, Ur NEGATIVE NEGATIVE  Urinalysis, Routine w reflex microscopic (not at [facility])     Status: Abnormal   Collection Time: 09/28/16  7:38 PM  Result Value Ref Range   Color, Urine YELLOW YELLOW   APPearance CLEAR CLEAR   Specific Gravity, Urine >1.030 (H) 1.005 - 1.030   pH 5.5 5.0 - 8.0   Glucose, UA NEGATIVE NEGATIVE mg/dL   Hgb urine dipstick NEGATIVE NEGATIVE   Bilirubin Urine NEGATIVE NEGATIVE   Ketones, ur NEGATIVE NEGATIVE mg/dL   Protein, ur NEGATIVE NEGATIVE mg/dL   Nitrite NEGATIVE NEGATIVE   Leukocytes, UA NEGATIVE NEGATIVE  CBC     Status: Abnormal   Collection Time: 09/28/16  8:35 PM  Result Value Ref Range   WBC 18.7 (H) 4.0 - 10.5 K/uL   RBC 4.63 3.87 - 5.11 MIL/uL   Hemoglobin 14.9 12.0 - 15.0 g/dL   HCT 41.5 36.0 - 46.0 %   MCV 89.6 78.0 - 100.0 fL   MCH 32.2 26.0 - 34.0 pg   MCHC 35.9 30.0 - 36.0 g/dL   RDW 12.4 11.5 - 15.5 %   Platelets 280 150 - 400 K/uL  Comprehensive metabolic panel     Status: Abnormal   Collection Time: 09/28/16  8:35 PM  Result Value Ref Range   Sodium 137 135 - 145 mmol/L   Potassium 4.0 3.5 - 5.1 mmol/L   Chloride 107 101 - 111 mmol/L   CO2 22 22 - 32 mmol/L   Glucose, Bld 108 (H) 65 - 99 mg/dL   BUN 11 6 - 20 mg/dL   Creatinine, Ser 0.85 0.44 - 1.00 mg/dL   Calcium 9.1 8.9 - 10.3 mg/dL   Total Protein 7.2 6.5 - 8.1 g/dL   Albumin 4.2 3.5 - 5.0 g/dL   AST 21 15 - 41 U/L   ALT 17 14 - 54 U/L   Alkaline Phosphatase 81 38 - 126 U/L   Total Bilirubin 0.4 0.3 - 1.2 mg/dL   GFR calc non Af Amer >60 >60 mL/min   GFR calc Af Amer >60 >60 mL/min   Anion gap 8 5 - 15  Amylase     Status: None   Collection Time: 09/28/16  8:35 PM  Result Value Ref Range   Amylase 92 28 - 100 U/L  Lipase, blood     Status: None   Collection Time: 09/28/16  8:35 PM  Result Value Ref Range   Lipase 17 11 - 51 U/L   Ct Abdomen Pelvis W Contrast  Result Date: 09/29/2016 CLINICAL DATA:  Right upper quadrant and  right lower quadrant abdominal pain with vomiting beginning this afternoon. EXAM: CT ABDOMEN AND PELVIS WITH CONTRAST TECHNIQUE: Multidetector CT imaging of the abdomen and pelvis was performed using the standard protocol following bolus administration of intravenous contrast. CONTRAST:  161mL ISOVUE-300 IOPAMIDOL (ISOVUE-300) INJECTION 61%, 1 ISOVUE-300 IOPAMIDOL (ISOVUE-300) INJECTION 61% COMPARISON:  None. FINDINGS: Lower chest: Motion artifact.  No acute abnormalities suggested. Hepatobiliary: No focal liver abnormality is seen. No gallstones, gallbladder wall thickening, or biliary dilatation. Pancreas: Unremarkable. No pancreatic ductal dilatation or  surrounding inflammatory changes. Spleen: Normal in size without focal abnormality. Adrenals/Urinary Tract: Adrenal glands are unremarkable. Kidneys are normal, without renal calculi, focal lesion, or hydronephrosis. Bladder is unremarkable. Stomach/Bowel: Appendix is fluid-filled and distended with diameter measured at 11.5 mm. Periappendiceal edema and stranding is present. Changes are consistent with acute appendicitis. No abscess. Stomach is unremarkable in appearance. Small bowel are mostly decompressed. No colonic distention or wall thickening. Vascular/Lymphatic: No significant vascular findings are present. No enlarged abdominal or pelvic lymph nodes. Reproductive: Probable involuting cyst in the right ovary. Uterus and ovaries are otherwise unremarkable. No free fluid in the pelvis. Other: No free air or free fluid in the abdomen. Abdominal wall musculature appears intact. Musculoskeletal: No acute or significant osseous findings. IMPRESSION: Changes of acute appendicitis.  No abscess. Electronically Signed   By: Lucienne Capers M.D.   On: 09/29/2016 00:06     MDM UPT negative VSS CBC, CMP, amylase, lipase zofran 8 mg ODT & toradol 60 mg IM Will send for CT scan d/t leukocytosis & pain location Differential added on  Waiting for pt to go to  CT. Care turned over to [place], NP 09/28/2016 9:53 PM   0016: D/W Dr[facility] Winfred Leeds at [facility]. He accepts transfer.  [id number]: D/W the patient the results of the CT and need for transfer to a facility that can provide the care she needs. She agreeable, and verbalizes understanding. Recommended [facility], but she declines at this time. She would like to go by private vehicle.   Assessment and Plan  Acute appendicitis Transfer to [facility] by private vehicle for further management

## 2016-09-29 ENCOUNTER — Encounter (HOSPITAL_COMMUNITY): Payer: Self-pay | Admitting: Oncology

## 2016-09-29 ENCOUNTER — Observation Stay (HOSPITAL_COMMUNITY): Payer: Self-pay | Admitting: Certified Registered Nurse Anesthetist

## 2016-09-29 ENCOUNTER — Encounter (HOSPITAL_COMMUNITY): Admission: AD | Disposition: A | Discharge status: Home / Self Care | Payer: Self-pay | Source: Ambulatory Visit

## 2016-09-29 DIAGNOSIS — K3580 Unspecified acute appendicitis: Secondary | ICD-10-CM

## 2016-09-29 DIAGNOSIS — K3533 Acute appendicitis with perforation and localized peritonitis, with abscess: Secondary | ICD-10-CM | POA: Diagnosis present

## 2016-09-29 HISTORY — PX: LAPAROSCOPIC APPENDECTOMY: SHX408

## 2016-09-29 SURGERY — APPENDECTOMY, LAPAROSCOPIC
Anesthesia: General | Site: Abdomen

## 2016-09-29 MED ORDER — ONDANSETRON 4 MG PO TBDP: 4.0000 mg | ORAL_TABLET | Freq: Four times a day (QID) | ORAL | Status: DC | PRN

## 2016-09-29 MED ORDER — LACTATED RINGERS IR SOLN
Status: DC | PRN
  Administered 2016-09-29: 3000 mL

## 2016-09-29 MED ORDER — PIPERACILLIN-TAZOBACTAM 3.375 G IVPB
INTRAVENOUS | Status: AC
  Filled 2016-09-29: qty 50

## 2016-09-29 MED ORDER — SUCCINYLCHOLINE CHLORIDE 20 MG/ML IJ SOLN
INTRAMUSCULAR | Status: DC | PRN
  Administered 2016-09-29: 100 mg via INTRAVENOUS

## 2016-09-29 MED ORDER — LIDOCAINE 2% (20 MG/ML) 5 ML SYRINGE
INTRAMUSCULAR | Status: AC
  Filled 2016-09-29: qty 5

## 2016-09-29 MED ORDER — LIDOCAINE HCL (CARDIAC) 20 MG/ML IV SOLN
INTRAVENOUS | Status: DC | PRN
  Administered 2016-09-29: 100 mg via INTRAVENOUS

## 2016-09-29 MED ORDER — ESMOLOL HCL 100 MG/10ML IV SOLN
INTRAVENOUS | Status: AC
  Filled 2016-09-29: qty 10

## 2016-09-29 MED ORDER — KCL IN DEXTROSE-NACL 20-5-0.9 MEQ/L-%-% IV SOLN
INTRAVENOUS | Status: DC
  Administered 2016-09-29: 06:00:00 via INTRAVENOUS
  Filled 2016-09-29 (×2): qty 1000

## 2016-09-29 MED ORDER — MORPHINE SULFATE (PF) 2 MG/ML IV SOLN
1.0000 mg | INTRAVENOUS | Status: DC | PRN
  Administered 2016-09-29 (×2): 2 mg via INTRAVENOUS
  Filled 2016-09-29 (×2): qty 1

## 2016-09-29 MED ORDER — DEXAMETHASONE SODIUM PHOSPHATE 10 MG/ML IJ SOLN
INTRAMUSCULAR | Status: DC | PRN
  Administered 2016-09-29: 10 mg via INTRAVENOUS

## 2016-09-29 MED ORDER — ONDANSETRON HCL 4 MG/2ML IJ SOLN: 4.0000 mg | Freq: Four times a day (QID) | INTRAMUSCULAR | Status: DC | PRN

## 2016-09-29 MED ORDER — PANTOPRAZOLE SODIUM 40 MG IV SOLR: 40.0000 mg | Freq: Every day | INTRAVENOUS | Status: DC

## 2016-09-29 MED ORDER — SUCCINYLCHOLINE CHLORIDE 200 MG/10ML IV SOSY
PREFILLED_SYRINGE | INTRAVENOUS | Status: AC
  Filled 2016-09-29: qty 10

## 2016-09-29 MED ORDER — KCL IN DEXTROSE-NACL 20-5-0.45 MEQ/L-%-% IV SOLN
INTRAVENOUS | Status: DC
  Administered 2016-09-29 – 2016-09-30 (×2): via INTRAVENOUS
  Filled 2016-09-29 (×4): qty 1000

## 2016-09-29 MED ORDER — PROPOFOL 10 MG/ML IV BOLUS
INTRAVENOUS | Status: DC | PRN
  Administered 2016-09-29: 150 mg via INTRAVENOUS

## 2016-09-29 MED ORDER — SIMETHICONE 80 MG PO CHEW: 40.0000 mg | CHEWABLE_TABLET | Freq: Four times a day (QID) | ORAL | Status: DC | PRN

## 2016-09-29 MED ORDER — DEXTROSE 5 % IV SOLN
2.0000 g | Freq: Once | INTRAVENOUS | Status: AC
  Administered 2016-09-29: 2 g via INTRAVENOUS
  Filled 2016-09-29: qty 2

## 2016-09-29 MED ORDER — 0.9 % SODIUM CHLORIDE (POUR BTL) OPTIME
TOPICAL | Status: DC | PRN
  Administered 2016-09-29: 1000 mL

## 2016-09-29 MED ORDER — PIPERACILLIN-TAZOBACTAM 3.375 G IVPB
3.3750 g | Freq: Three times a day (TID) | INTRAVENOUS | Status: DC
  Administered 2016-09-29 – 2016-10-01 (×5): 3.375 g via INTRAVENOUS
  Filled 2016-09-29 (×4): qty 50

## 2016-09-29 MED ORDER — ROCURONIUM BROMIDE 100 MG/10ML IV SOLN
INTRAVENOUS | Status: DC | PRN
  Administered 2016-09-29: 30 mg via INTRAVENOUS
  Administered 2016-09-29: 5 mg via INTRAVENOUS

## 2016-09-29 MED ORDER — DIPHENHYDRAMINE HCL 12.5 MG/5ML PO ELIX: 12.5000 mg | ORAL_SOLUTION | Freq: Four times a day (QID) | ORAL | Status: DC | PRN

## 2016-09-29 MED ORDER — HYDROMORPHONE HCL 1 MG/ML IJ SOLN
0.5000 mg | INTRAMUSCULAR | Status: DC | PRN
  Administered 2016-09-29 – 2016-10-01 (×5): 1 mg via INTRAVENOUS
  Filled 2016-09-29 (×5): qty 1

## 2016-09-29 MED ORDER — LACTATED RINGERS IV SOLN
INTRAVENOUS | Status: DC
  Administered 2016-09-29 (×2): via INTRAVENOUS

## 2016-09-29 MED ORDER — LIDOCAINE-EPINEPHRINE 1 %-1:100000 IJ SOLN
INTRAMUSCULAR | Status: AC
  Filled 2016-09-29: qty 1

## 2016-09-29 MED ORDER — ONDANSETRON HCL 4 MG/2ML IJ SOLN
INTRAMUSCULAR | Status: AC
  Filled 2016-09-29: qty 2

## 2016-09-29 MED ORDER — FENTANYL CITRATE (PF) 100 MCG/2ML IJ SOLN
INTRAMUSCULAR | Status: DC | PRN
  Administered 2016-09-29: 50 ug via INTRAVENOUS
  Administered 2016-09-29: 100 ug via INTRAVENOUS
  Administered 2016-09-29 (×2): 50 ug via INTRAVENOUS

## 2016-09-29 MED ORDER — PROPOFOL 10 MG/ML IV BOLUS
INTRAVENOUS | Status: AC
  Filled 2016-09-29: qty 20

## 2016-09-29 MED ORDER — HYDROMORPHONE HCL 1 MG/ML IJ SOLN: 0.2500 mg | INTRAMUSCULAR | Status: DC | PRN

## 2016-09-29 MED ORDER — SUGAMMADEX SODIUM 200 MG/2ML IV SOLN
INTRAVENOUS | Status: DC | PRN
  Administered 2016-09-29: 150 mg via INTRAVENOUS

## 2016-09-29 MED ORDER — OXYCODONE HCL 5 MG PO TABS
5.0000 mg | ORAL_TABLET | ORAL | Status: DC | PRN
  Administered 2016-09-29 – 2016-09-30 (×4): 5 mg via ORAL
  Administered 2016-10-01: 10 mg via ORAL
  Filled 2016-09-29 (×2): qty 1
  Filled 2016-09-29: qty 2
  Filled 2016-09-29 (×2): qty 1

## 2016-09-29 MED ORDER — MIDAZOLAM HCL 5 MG/5ML IJ SOLN
INTRAMUSCULAR | Status: DC | PRN
  Administered 2016-09-29 (×2): 1 mg via INTRAVENOUS

## 2016-09-29 MED ORDER — MIDAZOLAM HCL 2 MG/2ML IJ SOLN
INTRAMUSCULAR | Status: AC
  Filled 2016-09-29: qty 2

## 2016-09-29 MED ORDER — ALBUTEROL SULFATE HFA 108 (90 BASE) MCG/ACT IN AERS
INHALATION_SPRAY | RESPIRATORY_TRACT | Status: DC | PRN
  Administered 2016-09-29: 4 via RESPIRATORY_TRACT

## 2016-09-29 MED ORDER — ENOXAPARIN SODIUM 40 MG/0.4ML ~~LOC~~ SOLN
40.0000 mg | SUBCUTANEOUS | Status: DC
  Administered 2016-09-30 – 2016-10-01 (×2): 40 mg via SUBCUTANEOUS
  Filled 2016-09-29 (×2): qty 0.4

## 2016-09-29 MED ORDER — DEXAMETHASONE SODIUM PHOSPHATE 10 MG/ML IJ SOLN
INTRAMUSCULAR | Status: AC
  Filled 2016-09-29: qty 1

## 2016-09-29 MED ORDER — DIPHENHYDRAMINE HCL 50 MG/ML IJ SOLN: 12.5000 mg | Freq: Four times a day (QID) | INTRAMUSCULAR | Status: DC | PRN

## 2016-09-29 MED ORDER — BUPIVACAINE HCL (PF) 0.25 % IJ SOLN
INTRAMUSCULAR | Status: AC
  Filled 2016-09-29: qty 30

## 2016-09-29 MED ORDER — LIDOCAINE-EPINEPHRINE 1 %-1:100000 IJ SOLN
INTRAMUSCULAR | Status: DC | PRN
  Administered 2016-09-29: 20 mL

## 2016-09-29 MED ORDER — PIPERACILLIN-TAZOBACTAM 3.375 G IVPB
3.3750 g | Freq: Three times a day (TID) | INTRAVENOUS | Status: DC
  Administered 2016-09-29 (×2): 3.375 g via INTRAVENOUS
  Filled 2016-09-29: qty 50

## 2016-09-29 MED ORDER — METRONIDAZOLE IN NACL 5-0.79 MG/ML-% IV SOLN
500.0000 mg | Freq: Once | INTRAVENOUS | Status: AC
  Administered 2016-09-29: 500 mg via INTRAVENOUS
  Filled 2016-09-29: qty 100

## 2016-09-29 MED ORDER — ROCURONIUM BROMIDE 50 MG/5ML IV SOSY
PREFILLED_SYRINGE | INTRAVENOUS | Status: AC
  Filled 2016-09-29: qty 5

## 2016-09-29 MED ORDER — ALBUTEROL SULFATE HFA 108 (90 BASE) MCG/ACT IN AERS
INHALATION_SPRAY | RESPIRATORY_TRACT | Status: AC
  Filled 2016-09-29: qty 6.7

## 2016-09-29 MED ORDER — FENTANYL CITRATE (PF) 250 MCG/5ML IJ SOLN
INTRAMUSCULAR | Status: AC
  Filled 2016-09-29: qty 5

## 2016-09-29 MED ORDER — ONDANSETRON HCL 4 MG/2ML IJ SOLN
INTRAMUSCULAR | Status: DC | PRN
  Administered 2016-09-29: 4 mg via INTRAVENOUS

## 2016-09-29 MED ORDER — ACETAMINOPHEN 500 MG PO TABS
1000.0000 mg | ORAL_TABLET | Freq: Four times a day (QID) | ORAL | Status: DC
  Administered 2016-09-29 – 2016-10-01 (×8): 1000 mg via ORAL
  Filled 2016-09-29 (×8): qty 2

## 2016-09-29 SURGICAL SUPPLY — 37 items
APPLIER CLIP 5 13 M/L LIGAMAX5 (MISCELLANEOUS)
APPLIER CLIP ROT 10 11.4 M/L (STAPLE)
CABLE HIGH FREQUENCY MONO STRZ (ELECTRODE) IMPLANT
CLIP APPLIE 5 13 M/L LIGAMAX5 (MISCELLANEOUS) IMPLANT
CLIP APPLIE ROT 10 11.4 M/L (STAPLE) IMPLANT
CLOSURE WOUND 1/2 X4 (GAUZE/BANDAGES/DRESSINGS)
COVER SURGICAL LIGHT HANDLE (MISCELLANEOUS) ×3 IMPLANT
CUTTER FLEX LINEAR 45M (STAPLE) IMPLANT
DECANTER SPIKE VIAL GLASS SM (MISCELLANEOUS) ×3 IMPLANT
DERMABOND ADVANCED (GAUZE/BANDAGES/DRESSINGS) ×2
DERMABOND ADVANCED .7 DNX12 (GAUZE/BANDAGES/DRESSINGS) ×1 IMPLANT
DRAPE LAPAROSCOPIC ABDOMINAL (DRAPES) ×3 IMPLANT
ELECT PENCIL ROCKER SW 15FT (MISCELLANEOUS) IMPLANT
ELECT REM PT RETURN 9FT ADLT (ELECTROSURGICAL) ×3
ELECTRODE REM PT RTRN 9FT ADLT (ELECTROSURGICAL) ×1 IMPLANT
GLOVE BIO SURGEON STRL SZ7.5 (GLOVE) ×3 IMPLANT
GLOVE INDICATOR 8.0 STRL GRN (GLOVE) ×3 IMPLANT
GOWN STRL REUS W/TWL LRG LVL3 (GOWN DISPOSABLE) ×3 IMPLANT
GOWN STRL REUS W/TWL XL LVL3 (GOWN DISPOSABLE) ×6 IMPLANT
IRRIG SUCT STRYKERFLOW 2 WTIP (MISCELLANEOUS) ×3
IRRIGATION SUCT STRKRFLW 2 WTP (MISCELLANEOUS) ×1 IMPLANT
IV LACTATED RINGERS 1000ML (IV SOLUTION) ×3 IMPLANT
KIT BASIN OR (CUSTOM PROCEDURE TRAY) ×3 IMPLANT
L-HOOK LAP DISP 36CM (ELECTROSURGICAL)
LHOOK LAP DISP 36CM (ELECTROSURGICAL) IMPLANT
POUCH SPECIMEN RETRIEVAL 10MM (ENDOMECHANICALS) ×3 IMPLANT
RELOAD 45 VASCULAR/THIN (ENDOMECHANICALS) IMPLANT
RELOAD STAPLE TA45 3.5 REG BLU (ENDOMECHANICALS) IMPLANT
SHEARS HARMONIC ACE PLUS 36CM (ENDOMECHANICALS) ×3 IMPLANT
STRIP CLOSURE SKIN 1/2X4 (GAUZE/BANDAGES/DRESSINGS) IMPLANT
SUT MNCRL AB 4-0 PS2 18 (SUTURE) ×3 IMPLANT
TOWEL OR 17X26 10 PK STRL BLUE (TOWEL DISPOSABLE) ×3 IMPLANT
TRAY FOLEY W/METER SILVER 16FR (SET/KITS/TRAYS/PACK) ×3 IMPLANT
TRAY LAPAROSCOPIC (CUSTOM PROCEDURE TRAY) ×3 IMPLANT
TROCAR XCEL BLUNT TIP 100MML (ENDOMECHANICALS) ×3 IMPLANT
TROCAR XCEL NON-BLD 5MMX100MML (ENDOMECHANICALS) ×6 IMPLANT
TUBING INSUF HEATED (TUBING) ×3 IMPLANT

## 2016-09-29 NOTE — Anesthesia Procedure Notes (Signed)
Procedure Name: Intubation Date/Time: 09/29/2016 2:26 PM Performed by: Maxwell Caul Pre-anesthesia Checklist: Patient identified, Emergency Drugs available, Suction available and Patient being monitored Patient Re-evaluated:Patient Re-evaluated prior to inductionOxygen Delivery Method: Circle system utilized Preoxygenation: Pre-oxygenation with 100% oxygen Intubation Type: IV induction, Rapid sequence and Cricoid Pressure applied Laryngoscope Size: Mac and 4 Grade View: Grade I Tube type: Oral Tube size: 7.5 mm Number of attempts: 1 Airway Equipment and Method: Stylet and Oral airway Placement Confirmation: ETT inserted through vocal cords under direct vision,  positive ETCO2 and breath sounds checked- equal and bilateral Secured at: 21 cm Tube secured with: Tape Dental Injury: Teeth and Oropharynx as per pre-operative assessment

## 2016-09-29 NOTE — ED Notes (Signed)
Bed: WA03 Expected date:  Expected time:  Means of arrival:  Comments: Sharmon Leyden

## 2016-09-29 NOTE — Interval H&P Note (Signed)
History and Physical Interval Note:  09/29/2016 1:55 PM  Meghan Reeves  has presented today for surgery, with the diagnosis of appendicitis  The various methods of treatment have been discussed with the patient and family. After consideration of risks, benefits and other options for treatment, the patient has consented to  Procedure(s): APPENDECTOMY LAPAROSCOPIC (N/A) as a surgical intervention .  The patient's history has been reviewed, patient examined, no change in status, stable for surgery.  I have reviewed the patient's chart and labs.  Questions were answered to the patient's satisfaction.    Discussed with pt via video interpreter Jacob Moores [phone]  We discussed the etiology and management of acute appendicitis. We discussed operative and nonoperative management.  I recommended operative management along with IV antibiotics.  We discussed laparoscopic appendectomy. We discussed the risk and benefits of surgery including but not limited to bleeding, infection, injury to surrounding structures, need to convert to an open procedure, blood clot formation, post operative abscess or wound infection, staple line complications such as leak or bleeding, hernia formation, post operative ileus, need for additional procedures, anesthesia complications, and the typical postoperative course. I explained that the patient should expect a good improvement in their symptoms.  Leighton Ruff. Redmond Pulling, MD, [place], Bariatric, & Minimally Invasive Surgery [facility], [place]  [facility]

## 2016-09-29 NOTE — Op Note (Signed)
Meghan Reeves [id number] [birth year] 09/29/2016  Appendectomy, Lap, Procedure Note  Indications: The patient presented with a history of right-sided abdominal pain. A CT revealed findings consistent with acute appendicitis.  Pre-operative Diagnosis: acute appendicitis with localized peritonitis  Post-operative Diagnosis: ruptured appendicitis  Surgeon: Gayland Curry   Assistants: none  Anesthesia: General endotracheal anesthesia  Procedure Details  The patient was seen again in the Holding Room. The risks, benefits, complications, treatment options, and expected outcomes were discussed with the patient and/or family. The possibilities of perforation of viscus, bleeding, recurrent infection, the need for additional procedures, failure to diagnose a condition, and creating a complication requiring transfusion or operation were discussed. There was concurrence with the proposed plan and informed consent was obtained. The site of surgery was properly noted. The patient was taken to Operating Room, identified as [name] and the procedure verified as Appendectomy. A Time Out was held and the above information confirmed.  The patient was placed in the supine position and general anesthesia was induced, along with placement of orogastric tube, SCDs, and a Foley catheter. The abdomen was prepped and draped in a sterile fashion. A 1.5 centimeter infraumbilical incision was made.  The umbilical stalk was elevated, and the midline fascia was incised with a #11 blade.  A Kelly clamp was used to confirm entrance into the peritoneal cavity.  A pursestring suture was passed around the incision with a 0 Vicryl.  A 74mm Hasson was introduced into the abdomen and the tails of the suture were used to hold the Hasson in place.   The pneumoperitoneum was then established to steady pressure of 15 mmHg.  Additional 5 mm cannulas then placed in the left lower quadrant of the abdomen and the suprapubic region  under direct visualization. A careful evaluation of the entire abdomen was carried out. The patient was placed in Trendelenburg and left lateral decubitus position. The small intestines were retracted in the cephalad and left lateral direction away from the pelvis and right lower quadrant. The patient had omentum in her right lower quadrant covering her cecum and distal small bowel. I lifted up the omentum and this exposed the appendix. The appendix was mainly in the right paracolic gutter slightly retrocecal. The patient was found to have an inflamed appendix. I grabbed the tip of the appendix and in doing so it ruptured in the mid body. Several fecaliths came out along with purulent fluid. I was able to extract the fecaliths through the large-bore suction irrigation catheter.. There was evidence of perforation.   The appendix was carefully dissected. The appendix was was skeletonized with the harmonic scalpel.   The appendix was divided at its base using an endo-GIA stapler with a white load. No appendiceal stump was left in place. The appendix was removed from the abdomen with an Endocatch bag through the umbilical port.  There was no evidence of bleeding, leakage, or complication after division of the appendix. Irrigation was also performed and irrigate suctioned from the abdomen as well.  The umbilical port site was closed with the purse string suture. The closure was viewed laparoscopically. There was no residual palpable fascial defect.  The trocar site skin wounds were closed with 4-0 Monocryl. Dermabond was applied to the skin incisions.  Instrument, sponge, and needle counts were correct at the conclusion of the case.   Findings: The appendix was found to be inflamed. There were signs of necrosis.  There was perforation. There was not abscess formation.  Estimated Blood Loss:  less than 50 mL         Drains: none         Specimens: Appendix         Complications:  None; patient tolerated  the procedure well.         Disposition: PACU - hemodynamically stable.         Condition: stable  Leighton Ruff. Redmond Pulling, MD, FACS General, Bariatric, & Minimally Invasive Surgery [facility] Surgery, [place]

## 2016-09-29 NOTE — Anesthesia Postprocedure Evaluation (Signed)
Anesthesia Post Note  Patient: Meghan Reeves  Procedure(s) Performed: Procedure(s) (LRB): APPENDECTOMY LAPAROSCOPIC (N/A)  Patient location during evaluation: PACU Anesthesia Type: General Level of consciousness: sedated Pain management: satisfactory to patient Vital Signs Assessment: post-procedure vital signs reviewed and stable Respiratory status: spontaneous breathing Cardiovascular status: stable Anesthetic complications: no    Last Vitals:  Vitals:   09/29/16 1700 09/29/16 1711  BP: (!) 111/55 (!) 92/56  Pulse: 97 95  Resp: 18 14  Temp: 36.7 C 37.4 C    Last Pain:  Vitals:   09/29/16 1808  TempSrc:   PainSc: [place]

## 2016-09-29 NOTE — Anesthesia Preprocedure Evaluation (Addendum)
Anesthesia Evaluation  Patient identified by MRN, date of birth, ID band Patient awake    Reviewed: Allergy & Precautions, NPO status , Patient's Chart, lab work & pertinent test results  Airway Mallampati: II  TM Distance: >3 FB Neck ROM: Full    Dental no notable dental hx.    Pulmonary Current Smoker,    Pulmonary exam normal breath sounds clear to auscultation       Cardiovascular negative cardio ROS Normal cardiovascular exam Rhythm:Regular Rate:Normal     Neuro/Psych negative neurological ROS  negative psych ROS   GI/Hepatic negative GI ROS, Neg liver ROS,   Endo/Other  negative endocrine ROS  Renal/GU negative Renal ROS  negative genitourinary   Musculoskeletal negative musculoskeletal ROS (+)   Abdominal   Peds negative pediatric ROS (+)  Hematology negative hematology ROS (+)   Anesthesia Other Findings   Reproductive/Obstetrics negative OB ROS                             Anesthesia Physical Anesthesia Plan  ASA: II  Anesthesia Plan: General   Post-op Pain Management:    Induction: Intravenous  Airway Management Planned: Oral ETT  Additional Equipment:   Intra-op Plan:   Post-operative Plan: Extubation in OR  Informed Consent: I have reviewed the patients History and Physical, chart, labs and discussed the procedure including the risks, benefits and alternatives for the proposed anesthesia with the patient or authorized representative who has indicated his/her understanding and acceptance.   Dental advisory given  Plan Discussed with: CRNA and Surgeon  Anesthesia Plan Comments:         Anesthesia Quick Evaluation

## 2016-09-29 NOTE — H&P (Signed)
Meghan Reeves is an 35 y.o. female.   Chief Complaint: abdominal pain HPI:  The patient is a 35 year old Hispanic female who presents with abdominal pain that started yesterday. The pain is located mostly in the right lower quadrant although there is some pain in the right upper quadrant. The pain is been associated with nausea and vomiting. She did not have a fever at home but did have a low-grade fever at presentation to the hospital. She then underwent a CT scan that showed changes consistent with acute appendicitis. There was no evidence of perforation. She denies any previous surgery. She is a smoker.  Past Medical History:  Diagnosis Date  . Medical history non-contributory     Past Surgical History:  Procedure Laterality Date  . NO PAST SURGERIES      Family History  Problem Relation Age of Onset  . Asthma Mother    Social History:  reports that she has been smoking Cigarettes.  She has a 2.50 pack-year smoking history. She has never used smokeless tobacco. She reports that she does not drink alcohol or use drugs.  Allergies: No Known Allergies   (Not in a hospital admission)  Results for orders placed or performed during the hospital encounter of 09/28/16 (from the past 48 hour(s))  Pregnancy, urine POC     Status: None   Collection Time: 09/28/16  7:37 PM  Result Value Ref Range   Preg Test, Ur NEGATIVE NEGATIVE    Comment:        THE SENSITIVITY OF THIS METHODOLOGY IS >24 mIU/mL   Urinalysis, Routine w reflex microscopic (not at [facility])     Status: Abnormal   Collection Time: 09/28/16  7:38 PM  Result Value Ref Range   Color, Urine YELLOW YELLOW   APPearance CLEAR CLEAR   Specific Gravity, Urine >1.030 (H) 1.005 - 1.030   pH 5.5 5.0 - 8.0   Glucose, UA NEGATIVE NEGATIVE mg/dL   Hgb urine dipstick NEGATIVE NEGATIVE   Bilirubin Urine NEGATIVE NEGATIVE   Ketones, ur NEGATIVE NEGATIVE mg/dL   Protein, ur NEGATIVE NEGATIVE mg/dL   Nitrite NEGATIVE NEGATIVE    Leukocytes, UA NEGATIVE NEGATIVE    Comment: MICROSCOPIC NOT DONE ON URINES WITH NEGATIVE PROTEIN, BLOOD, LEUKOCYTES, NITRITE, OR GLUCOSE <1000 mg/dL.  CBC     Status: Abnormal   Collection Time: 09/28/16  8:35 PM  Result Value Ref Range   WBC 18.7 (H) 4.0 - 10.5 K/uL   RBC 4.63 3.87 - 5.11 MIL/uL   Hemoglobin 14.9 12.0 - 15.0 g/dL   HCT 41.5 36.0 - 46.0 %   MCV 89.6 78.0 - 100.0 fL   MCH 32.2 26.0 - 34.0 pg   MCHC 35.9 30.0 - 36.0 g/dL   RDW 12.4 11.5 - 15.5 %   Platelets 280 150 - 400 K/uL  Comprehensive metabolic panel     Status: Abnormal   Collection Time: 09/28/16  8:35 PM  Result Value Ref Range   Sodium 137 135 - 145 mmol/L   Potassium 4.0 3.5 - 5.1 mmol/L   Chloride 107 101 - 111 mmol/L   CO2 22 22 - 32 mmol/L   Glucose, Bld 108 (H) 65 - 99 mg/dL   BUN 11 6 - 20 mg/dL   Creatinine, Ser 0.85 0.44 - 1.00 mg/dL   Calcium 9.1 8.9 - 10.3 mg/dL   Total Protein 7.2 6.5 - 8.1 g/dL   Albumin 4.2 3.5 - 5.0 g/dL   AST 21 15 - 41 U/L  ALT 17 14 - 54 U/L   Alkaline Phosphatase 81 38 - 126 U/L   Total Bilirubin 0.4 0.3 - 1.2 mg/dL   GFR calc non Af Amer >60 >60 mL/min   GFR calc Af Amer >60 >60 mL/min    Comment: (NOTE) The eGFR has been calculated using the CKD EPI equation. This calculation has not been validated in all clinical situations. eGFR's persistently <60 mL/min signify possible Chronic Kidney Disease.    Anion gap 8 5 - 15  Amylase     Status: None   Collection Time: 09/28/16  8:35 PM  Result Value Ref Range   Amylase 92 28 - 100 U/L  Lipase, blood     Status: None   Collection Time: 09/28/16  8:35 PM  Result Value Ref Range   Lipase 17 11 - 51 U/L  Differential     Status: Abnormal   Collection Time: 09/28/16  8:35 PM  Result Value Ref Range   Neutrophils Relative % 87 %   Neutro Abs 15.9 (H) 1.7 - 7.7 K/uL   Lymphocytes Relative 10 %   Lymphs Abs 1.9 0.7 - 4.0 K/uL   Monocytes Relative 2 %   Monocytes Absolute 0.4 0.1 - 1.0 K/uL   Eosinophils  Relative 0 %   Eosinophils Absolute 0.0 0.0 - 0.7 K/uL   Basophils Relative 0 %   Basophils Absolute 0.0 0.0 - 0.1 K/uL   Ct Abdomen Pelvis W Contrast  Result Date: 09/29/2016 CLINICAL DATA:  Right upper quadrant and right lower quadrant abdominal pain with vomiting beginning this afternoon. EXAM: CT ABDOMEN AND PELVIS WITH CONTRAST TECHNIQUE: Multidetector CT imaging of the abdomen and pelvis was performed using the standard protocol following bolus administration of intravenous contrast. CONTRAST:  167m ISOVUE-300 IOPAMIDOL (ISOVUE-300) INJECTION 61%, 1 ISOVUE-300 IOPAMIDOL (ISOVUE-300) INJECTION 61% COMPARISON:  None. FINDINGS: Lower chest: Motion artifact.  No acute abnormalities suggested. Hepatobiliary: No focal liver abnormality is seen. No gallstones, gallbladder wall thickening, or biliary dilatation. Pancreas: Unremarkable. No pancreatic ductal dilatation or surrounding inflammatory changes. Spleen: Normal in size without focal abnormality. Adrenals/Urinary Tract: Adrenal glands are unremarkable. Kidneys are normal, without renal calculi, focal lesion, or hydronephrosis. Bladder is unremarkable. Stomach/Bowel: Appendix is fluid-filled and distended with diameter measured at 11.5 mm. Periappendiceal edema and stranding is present. Changes are consistent with acute appendicitis. No abscess. Stomach is unremarkable in appearance. Small bowel are mostly decompressed. No colonic distention or wall thickening. Vascular/Lymphatic: No significant vascular findings are present. No enlarged abdominal or pelvic lymph nodes. Reproductive: Probable involuting cyst in the right ovary. Uterus and ovaries are otherwise unremarkable. No free fluid in the pelvis. Other: No free air or free fluid in the abdomen. Abdominal wall musculature appears intact. Musculoskeletal: No acute or significant osseous findings. IMPRESSION: Changes of acute appendicitis.  No abscess. Electronically Signed   By: WLucienne CapersM.D.    On: 09/29/2016 00:06    Review of Systems  Constitutional: Positive for fever.  HENT: Negative.   Eyes: Negative.   Respiratory: Negative.   Cardiovascular: Negative.   Gastrointestinal: Positive for abdominal pain, nausea and vomiting.  Genitourinary: Negative.   Musculoskeletal: Negative.   Skin: Negative.   Neurological: Negative.   Endo/Heme/Allergies: Negative.   Psychiatric/Behavioral: Negative.     Blood pressure 102/66, pulse 92, temperature 98.2 F (36.8 C), temperature source Oral, resp. rate 18, weight 64.1 kg (141 lb 4 oz), last menstrual period 09/08/2016, SpO2 97 %, unknown if currently breastfeeding. Physical Exam  Constitutional:  She is oriented to person, place, and time. She appears well-developed and well-nourished.  HENT:  Head: Normocephalic and atraumatic.  Eyes: Conjunctivae and EOM are normal. Pupils are equal, round, and reactive to light.  Neck: Normal range of motion. Neck supple.  Cardiovascular: Normal rate, regular rhythm and normal heart sounds.   Respiratory: Effort normal and breath sounds normal.  GI: Soft.  There is moderate tenderness in RLQ. No peritonitis  Musculoskeletal: Normal range of motion.  Neurological: She is alert and oriented to person, place, and time.  Skin: Skin is warm and dry.  Psychiatric: She has a normal mood and affect. Her behavior is normal.     Assessment/Plan  The patient appears to have acute appendicitis without evidence of rupture. Because of the risk of perforation and sepsis and think she would benefit from having her appendix removed. She would also like to have this done. I have discussed with her in detail the risks and benefits of the operation to remove the appendix as well as some of the technical aspects and she understands and wishes to proceed. We will start her on broad-spectrum antibiotic therapy. I will discuss the timing of the surgery with the primary team in the morning.  Merrie Roof,  MD 09/29/2016, 4:27 AM

## 2016-09-29 NOTE — Transfer of Care (Signed)
Immediate Anesthesia Transfer of Care Note  Patient: Meghan Reeves  Procedure(s) Performed: Procedure(s): APPENDECTOMY LAPAROSCOPIC (N/A)  Patient Location: PACU  Anesthesia Type:General  Level of Consciousness:  sedated, patient cooperative and responds to stimulation  Airway & Oxygen Therapy:Patient Spontanous Breathing and Patient connected to face mask oxgen  Post-op Assessment:  Report given to PACU RN and Post -op Vital signs reviewed and stable  Post vital signs:  Reviewed and stable  Last Vitals:  Vitals:   09/29/16 0530 09/29/16 0644  BP: 103/64 (!) 102/58  Pulse: 95 98  Resp:  18  Temp:  [id number]    Complications: No apparent anesthesia complications

## 2016-09-30 ENCOUNTER — Encounter (HOSPITAL_COMMUNITY): Payer: Self-pay | Admitting: General Surgery

## 2016-09-30 LAB — CBC
HCT: 35.7 % — ABNORMAL LOW (ref 36.0–46.0)
HEMOGLOBIN: 12.4 g/dL (ref 12.0–15.0)
MCH: 32.3 pg (ref 26.0–34.0)
MCHC: 34.7 g/dL (ref 30.0–36.0)
MCV: 93 fL (ref 78.0–100.0)
PLATELETS: 238 10*3/uL (ref 150–400)
RBC: 3.84 MIL/uL — AB (ref 3.87–5.11)
RDW: 12.7 % (ref 11.5–15.5)
WBC: 25.2 10*3/uL — AB (ref 4.0–10.5)

## 2016-09-30 LAB — BASIC METABOLIC PANEL
Anion gap: 6 (ref 5–15)
BUN: 10 mg/dL (ref 6–20)
CALCIUM: 8.6 mg/dL — AB (ref 8.9–10.3)
CO2: 23 mmol/L (ref 22–32)
Chloride: 107 mmol/L (ref 101–111)
Creatinine, Ser: 0.78 mg/dL (ref 0.44–1.00)
GFR calc Af Amer: >60 mL/min (ref >60)
GLUCOSE: 171 mg/dL — AB (ref 65–99)
Potassium: 4.1 mmol/L (ref 3.5–5.1)
SODIUM: 136 mmol/L (ref 135–145)

## 2016-09-30 LAB — MAGNESIUM: Magnesium: 2 mg/dL (ref 1.7–2.4)

## 2016-09-30 MED ORDER — PHENOL 1.4 % MT LIQD
1.0000 | OROMUCOSAL | Status: DC | PRN
  Administered 2016-09-30: 1 via OROMUCOSAL
  Filled 2016-09-30: qty 177

## 2016-09-30 MED ORDER — POLYETHYLENE GLYCOL 3350 17 G PO PACK
17.0000 g | PACK | Freq: Every day | ORAL | Status: DC
  Administered 2016-09-30 – 2016-10-01 (×2): 17 g via ORAL
  Filled 2016-09-30 (×2): qty 1

## 2016-09-30 NOTE — Progress Notes (Signed)
Meghan Reeves Progress Note  1 Day Post-Op  Subjective: Translator used during my exam. Abdominal pain improving - reports some soreness but the pain is much better than it was when she presented to hospital. Tolerating PO without nausea/vomiting. +flatus. Denies BM. Ambulating. Pulling 1000cc on IS.  Patient is eager to go home - I discussed my recommendation for 24 more hours of IV antibiotics due to perforated appendix.   Objective: Vital signs in last 24 hours: Temp:  [97.7 F (36.5 C)-99.3 F (37.4 C)] 98.2 F (36.8 C) (11/22 0603) Pulse Rate:  [63-107] 63 (11/22 0603) Resp:  [12-18] 16 (11/22 0603) BP: (92-121)/(52-68) 94/52 (11/22 0603) SpO2:  [96 %-100 %] 96 % (11/22 0603)    Intake/Output from previous day: 11/21 0701 - 11/22 0700 In: 2610 [I.V.:2560; IV Piggyback:50] Out: 650 [Urine:625; Blood:25] Intake/Output this shift: No intake/output data recorded.  PE: Gen:  Alert, NAD, pleasant Abd: Soft, appropriately tender, mild distention, +BS, incisions C/D/I,  Ext:  No erythema, edema, or tenderness   Lab Results:   Recent Labs  09/28/16 2035 09/30/16 0402  WBC 18.7* 25.2*  HGB 14.9 12.4  HCT 41.5 35.7*  PLT 280 238   BMET  Recent Labs  09/28/16 2035 09/30/16 0402  NA 137 136  K 4.0 4.1  CL 107 107  CO2 22 23  GLUCOSE 108* 171*  BUN 11 10  CREATININE 0.85 0.78  CALCIUM 9.1 8.6*   PT/INR No results for input(s): LABPROT, INR in the last 72 hours. CMP     Component Value Date/Time   NA 136 09/30/2016 0402   K 4.1 09/30/2016 0402   CL 107 09/30/2016 0402   CO2 23 09/30/2016 0402   GLUCOSE 171 (H) 09/30/2016 0402   BUN 10 09/30/2016 0402   CREATININE 0.78 09/30/2016 0402   CALCIUM 8.6 (L) 09/30/2016 0402   PROT 7.2 09/28/2016 2035   ALBUMIN 4.2 09/28/2016 2035   AST 21 09/28/2016 2035   ALT 17 09/28/2016 2035   ALKPHOS 81 09/28/2016 2035   BILITOT 0.4 09/28/2016 2035   GFRNONAA >60 09/30/2016 0402   GFRAA >60 09/30/2016 0402    Lipase     Component Value Date/Time   LIPASE 17 09/28/2016 2035       Studies/Results: Ct Abdomen Pelvis W Contrast  Result Date: 09/29/2016 CLINICAL DATA:  Right upper quadrant and right lower quadrant abdominal pain with vomiting beginning this afternoon. EXAM: CT ABDOMEN AND PELVIS WITH CONTRAST TECHNIQUE: Multidetector CT imaging of the abdomen and pelvis was performed using the standard protocol following bolus administration of intravenous contrast. CONTRAST:  154mL ISOVUE-300 IOPAMIDOL (ISOVUE-300) INJECTION 61%, 1 ISOVUE-300 IOPAMIDOL (ISOVUE-300) INJECTION 61% COMPARISON:  None. FINDINGS: Lower chest: Motion artifact.  No acute abnormalities suggested. Hepatobiliary: No focal liver abnormality is seen. No gallstones, gallbladder wall thickening, or biliary dilatation. Pancreas: Unremarkable. No pancreatic ductal dilatation or surrounding inflammatory changes. Spleen: Normal in size without focal abnormality. Adrenals/Urinary Tract: Adrenal glands are unremarkable. Kidneys are normal, without renal calculi, focal lesion, or hydronephrosis. Bladder is unremarkable. Stomach/Bowel: Appendix is fluid-filled and distended with diameter measured at 11.5 mm. Periappendiceal edema and stranding is present. Changes are consistent with acute appendicitis. No abscess. Stomach is unremarkable in appearance. Small bowel are mostly decompressed. No colonic distention or wall thickening. Vascular/Lymphatic: No significant vascular findings are present. No enlarged abdominal or pelvic lymph nodes. Reproductive: Probable involuting cyst in the right ovary. Uterus and ovaries are otherwise unremarkable. No free fluid in the pelvis. Other:  No free air or free fluid in the abdomen. Abdominal wall musculature appears intact. Musculoskeletal: No acute or significant osseous findings. IMPRESSION: Changes of acute appendicitis.  No abscess. Electronically Signed   By: Lucienne Capers M.D.   On: 09/29/2016 00:06     Anti-infectives: Anti-infectives    Start     Dose/Rate Route Frequency Ordered Stop   09/29/16 2200  piperacillin-tazobactam (ZOSYN) IVPB 3.375 g     3.375 g 12.5 mL/hr over 240 Minutes Intravenous Every 8 hours 09/29/16 1723     09/29/16 0445  piperacillin-tazobactam (ZOSYN) IVPB 3.375 g  Status:  Discontinued     3.375 g 12.5 mL/hr over 240 Minutes Intravenous Every 8 hours 09/29/16 0432 09/29/16 1710   09/29/16 0200  cefTRIAXone (ROCEPHIN) 2 g in dextrose 5 % 50 mL IVPB     2 g 100 mL/hr over 30 Minutes Intravenous  Once 09/29/16 0149 09/29/16 0246   09/29/16 0200  metroNIDAZOLE (FLAGYL) IVPB 500 mg     500 mg 100 mL/hr over 60 Minutes Intravenous  Once 09/29/16 0149 09/29/16 0349     Assessment/Plan Acute, perforated appendicitis without abscess formation or necrosis  POD#1 laparoscopic appendectomy Dr. Greer Pickerel 09/29/16  WBC 25.2   Continue IV abx  IS/pulm toilet and ambulate  FEN: Regular ID: Zosyn 11/20 >> VTE: lovenox, SCD's  Dispo: IV abx, CBC in AM Anticipate discharge in 24-48 hours with one week PO abx.   LOS: 1 day    Old Meghan Reeves 09/30/2016, 12:28 PM Pager: [phone] Consults: [phone] Mon-Fri 7:00 am-4:30 pm Sat-Sun 7:00 am-11:30 am

## 2016-10-01 DIAGNOSIS — IMO0001 Reserved for inherently not codable concepts without codable children: Secondary | ICD-10-CM

## 2016-10-01 LAB — CBC
HCT: 31 % — ABNORMAL LOW (ref 36.0–46.0)
Hemoglobin: 10.7 g/dL — ABNORMAL LOW (ref 12.0–15.0)
MCH: 31.5 pg (ref 26.0–34.0)
MCHC: 34.5 g/dL (ref 30.0–36.0)
MCV: 91.2 fL (ref 78.0–100.0)
Platelets: 204 10*3/uL (ref 150–400)
RBC: 3.4 MIL/uL — ABNORMAL LOW (ref 3.87–5.11)
RDW: 12.8 % (ref 11.5–15.5)
WBC: 17.2 10*3/uL — ABNORMAL HIGH (ref 4.0–10.5)

## 2016-10-01 MED ORDER — AMOXICILLIN-POT CLAVULANATE 875-125 MG PO TABS: 1.0000 | ORAL_TABLET | Freq: Two times a day (BID) | ORAL | 3 refills | Status: DC

## 2016-10-01 MED ORDER — OXYCODONE HCL 5 MG PO TABS: 5.0000 mg | ORAL_TABLET | ORAL | 0 refills | Status: DC | PRN

## 2016-10-01 NOTE — Discharge Summary (Signed)
Physician Discharge Summary  Meghan Reeves [id number] DOB: 09-29-1981 DOA: 09/28/2016  PCP: No primary care provider on file.  Admit date: 09/28/2016 Discharge date: 10/01/2016  Recommendations for Outpatient Follow-up:  1.  Follow-up Information    Gayland Curry, MD. Schedule an appointment as soon as possible for a visit in 3 week(s).   Specialty:  General Surgery Why:  surgical followup Contact information: [address] [place] [address] [phone]          Discharge Diagnoses:  1. Ruptured appendicitis  Surgical Procedure: lap appy  Discharge Condition: good Disposition: home  Diet recommendation: regular  Filed Weights   09/28/16 1953 09/29/16 0636  Weight: 64.1 kg (141 lb 4 oz) 64 kg (141 lb)    History of present illness: The patient is a 35 year old Hispanic female who presents with abdominal pain that started yesterday. The pain is located mostly in the right lower quadrant although there is some pain in the right upper quadrant. The pain is been associated with nausea and vomiting. She did not have a fever at home but did have a low-grade fever at presentation to the hospital. She then underwent a CT scan that showed changes consistent with acute appendicitis. There was no evidence of perforation. She denies any previous surgery. She is a smoker.   Hospital Course:  Pt taken to OR on 11/21 for lap appy. The appendix ruptured while mobilizing it. Pt kept on IV abx postop. On POD 2 she was tolerating a diet, no fever, no tachycardia, wbc trending down, had had a BM, ambulating without diffculty. She was deemed stable for dc. Pt interviewed and dc instructions gone over in detail with pt via [place] interpreter [phone]. Discussed f/u, where to get rx given holiday  BP (!) 103/57 (BP Location: Right Arm)   Pulse 78   Temp 99 F (37.2 C) (Oral)   Resp 16   Ht 5\' 2"  (1.575 m)   Wt 64 kg (141 lb)   LMP 09/08/2016 (Exact Date)    SpO2 97%   Breastfeeding? Unknown   BMI 25.79 kg/m   Gen: alert, NAD, non-toxic appearing Pupils: equal, no scleral icterus Pulm: Lungs clear to auscultation, symmetric chest rise CV: regular rate and rhythm Abd: soft, mild approp tenderness, nondistended.  No cellulitis. No incisional hernia Ext: no edema, no calf tenderness Skin: no rash, no jaundice    Discharge Instructions  Discharge Instructions    Call MD for:    Complete by:  As directed    Temperature >101   Call MD for:  hives    Complete by:  As directed    Call MD for:  persistant dizziness or light-headedness    Complete by:  As directed    Call MD for:  persistant nausea and vomiting    Complete by:  As directed    Call MD for:  redness, tenderness, or signs of infection (pain, swelling, redness, odor or green/yellow discharge around incision site)    Complete by:  As directed    Call MD for:  severe uncontrolled pain    Complete by:  As directed    Diet general    Complete by:  As directed    Discharge instructions    Complete by:  As directed    See [facility] discharge instructions   Discharge patient    Complete by:  As directed    Increase activity slowly    Complete by:  As directed  Medication List    TAKE these medications   amoxicillin-clavulanate 875-125 MG tablet Commonly known as:  AUGMENTIN Take 1 tablet by mouth 2 (two) times daily.   ibuprofen 200 MG tablet Commonly known as:  ADVIL,MOTRIN Take 600 mg by mouth every 6 (six) hours as needed for moderate pain.   oxyCODONE 5 MG immediate release tablet Commonly known as:  Oxy IR/ROXICODONE Take 1-2 tablets (5-10 mg total) by mouth every 4 (four) hours as needed for moderate pain.   PNV PRENATAL PLUS MULTIVIT+DHA 27-1 & 312 MG Misc Take 1 tablet by mouth daily.      Follow-up Information    Gayland Curry, MD. Schedule an appointment as soon as possible for a visit in 3 week(s).   Specialty:  General Surgery Why:  surgical  followup Contact information: [place]  [address] [phone]            The results of significant diagnostics from this hospitalization (including imaging, microbiology, ancillary and laboratory) are listed below for reference.    Significant Diagnostic Studies: Ct Abdomen Pelvis W Contrast  Result Date: 09/29/2016 CLINICAL DATA:  Right upper quadrant and right lower quadrant abdominal pain with vomiting beginning this afternoon. EXAM: CT ABDOMEN AND PELVIS WITH CONTRAST TECHNIQUE: Multidetector CT imaging of the abdomen and pelvis was performed using the standard protocol following bolus administration of intravenous contrast. CONTRAST:  133mL ISOVUE-300 IOPAMIDOL (ISOVUE-300) INJECTION 61%, 1 ISOVUE-300 IOPAMIDOL (ISOVUE-300) INJECTION 61% COMPARISON:  None. FINDINGS: Lower chest: Motion artifact.  No acute abnormalities suggested. Hepatobiliary: No focal liver abnormality is seen. No gallstones, gallbladder wall thickening, or biliary dilatation. Pancreas: Unremarkable. No pancreatic ductal dilatation or surrounding inflammatory changes. Spleen: Normal in size without focal abnormality. Adrenals/Urinary Tract: Adrenal glands are unremarkable. Kidneys are normal, without renal calculi, focal lesion, or hydronephrosis. Bladder is unremarkable. Stomach/Bowel: Appendix is fluid-filled and distended with diameter measured at 11.5 mm. Periappendiceal edema and stranding is present. Changes are consistent with acute appendicitis. No abscess. Stomach is unremarkable in appearance. Small bowel are mostly decompressed. No colonic distention or wall thickening. Vascular/Lymphatic: No significant vascular findings are present. No enlarged abdominal or pelvic lymph nodes. Reproductive: Probable involuting cyst in the right ovary. Uterus and ovaries are otherwise unremarkable. No free fluid in the pelvis. Other: No free air or free fluid in the abdomen. Abdominal wall musculature  appears intact. Musculoskeletal: No acute or significant osseous findings. IMPRESSION: Changes of acute appendicitis.  No abscess. Electronically Signed   By: Lucienne Capers M.D.   On: 09/29/2016 00:06    Microbiology: No results found for this or any previous visit (from the past 240 hour(s)).   Labs: Basic Metabolic Panel:  Recent Labs Lab 09/28/16 2035 09/30/16 0402  NA 137 136  K 4.0 4.1  CL 107 107  CO2 22 23  GLUCOSE 108* 171*  BUN 11 10  CREATININE 0.85 0.78  CALCIUM 9.1 8.6*  MG  --  2.0   Liver Function Tests:  Recent Labs Lab 09/28/16 2035  AST 21  ALT 17  ALKPHOS 81  BILITOT 0.4  PROT 7.2  ALBUMIN 4.2    Recent Labs Lab 09/28/16 2035  LIPASE 17  AMYLASE 92   No results for input(s): AMMONIA in the last 168 hours. CBC:  Recent Labs Lab 09/28/16 2035 09/30/16 0402 10/01/16 0344  WBC 18.7* 25.2* 17.2*  NEUTROABS 15.9*  --   --   HGB 14.9 12.4 10.7*  HCT 41.5 35.7* 31.0*  MCV 89.6 93.0 91.2  PLT 280 238 204   Cardiac Enzymes: No results for input(s): CKTOTAL, CKMB, CKMBINDEX, TROPONINI in the last 168 hours. BNP: BNP (last 3 results) No results for input(s): BNP in the last 8760 hours.  ProBNP (last 3 results) No results for input(s): PROBNP in the last 8760 hours.  CBG: No results for input(s): GLUCAP in the last 168 hours.  Principal Problem:   Appendicitis, acute, with peritonitis s/p lap appendectomy 09/29/2016 Active Problems:   Spanish speaking patient   Time coordinating discharge: 15 minutes  Signed:  Gayland Curry, MD [facility], [place] 10/01/2016, 11:37 AM

## 2016-10-01 NOTE — Progress Notes (Signed)
Pt ready for discharge. Discharge instructions reviewed with interpreter and MD and RN. Pt was aware of all instructions. RX printed and signed for her to pick up meds. IV removed. Family member here to take patient home. VSS.

## 2016-12-23 ENCOUNTER — Ambulatory Visit (INDEPENDENT_AMBULATORY_CARE_PROVIDER_SITE_OTHER): Payer: Self-pay | Admitting: Emergency Medicine

## 2016-12-23 VITALS — BP 118/80 | HR 74 | Temp 98.5°F | Resp 16 | Ht 61.0 in | Wt 138.0 lb

## 2016-12-23 DIAGNOSIS — Z3A01 Less than 8 weeks gestation of pregnancy: Secondary | ICD-10-CM

## 2016-12-23 DIAGNOSIS — Z3200 Encounter for pregnancy test, result unknown: Secondary | ICD-10-CM

## 2016-12-23 LAB — POCT URINE PREGNANCY: Preg Test, Ur: POSITIVE — AB

## 2016-12-23 NOTE — Progress Notes (Signed)
Meghan Reeves November 36 y.o.   Chief Complaint  Patient presents with  . Pregnancy Test    HISTORY OF PRESENT ILLNESS: This is a 36 y.o. female thinks she might be pregnant and wants a test. Asymptomatic.  HPI   Prior to Admission medications   Medication Sig Start Date End Date Taking? Authorizing Provider  Ibuprofen-Diphenhydramine Cit (ADVIL PM PO) Take by mouth.   Yes Historical Provider, MD    Not on File  Patient Active Problem List   Diagnosis Date Noted  . Initiation of Depo Provera 07/29/2015    No past medical history on file.  No past surgical history on file.  Social History   Social History  . Marital status: Married    Spouse name: N/A  . Number of children: N/A  . Years of education: N/A   Occupational History  . Not on file.   Social History Main Topics  . Smoking status: Current Every Day Smoker  . Smokeless tobacco: Never Used  . Alcohol use No  . Drug use: No  . Sexual activity: Not on file   Other Topics Concern  . Not on file   Social History Narrative  . No narrative on file    No family history on file.   Review of Systems  Constitutional: Negative.   HENT: Negative.   Eyes: Negative.   Respiratory: Negative.   Cardiovascular: Negative.   Gastrointestinal: Negative.   Genitourinary: Negative.   Musculoskeletal: Negative.   Skin: Negative.   Neurological: Negative.   Endo/Heme/Allergies: Negative.   All other systems reviewed and are negative.  Vitals:   12/23/16 1553  BP: 118/80  Pulse: 74  Resp: 16  Temp: 98.5 F (36.9 C)    Physical Exam  Constitutional: She is oriented to person, place, and time. She appears well-developed and well-nourished.  HENT:  Head: Normocephalic and atraumatic.  Nose: Nose normal.  Mouth/Throat: Oropharynx is clear and moist. No oropharyngeal exudate.  Eyes: Conjunctivae and EOM are normal. Pupils are equal, round, and reactive to light.  Neck: Normal range of motion. Neck supple.  No JVD present. No thyromegaly present.  Cardiovascular: Normal rate, regular rhythm and normal heart sounds.   Pulmonary/Chest: Effort normal and breath sounds normal.  Abdominal: Soft. Bowel sounds are normal.  Musculoskeletal: Normal range of motion.  Lymphadenopathy:    She has no cervical adenopathy.  Neurological: She is alert and oriented to person, place, and time.  Skin: Skin is warm and dry.  Psychiatric: She has a normal mood and affect. Her behavior is normal.  Vitals reviewed.    ASSESSMENT & PLAN: Meghan Reeves was seen today for pregnancy test.  Diagnoses and all orders for this visit:  Less than [redacted] weeks gestation of pregnancy -     Ambulatory referral to Perinatology  Possible pregnancy -     POCT urine pregnancy    Patient Instructions       IF you received an x-ray today, you will receive an invoice from [facility]. Please contact [facility] at [phone] with questions or concerns regarding your invoice.   IF you received labwork today, you will receive an invoice from [place]. Please contact [facility] at [phone] with questions or concerns regarding your invoice.   Our billing staff will not be able to assist you with questions regarding bills from these companies.  You will be contacted with the lab results as soon as they are available. The fastest way to get your results is to  activate your My Chart account. Instructions are located on the last page of this paperwork. If you have not heard from [place] regarding the results in 2 weeks, please contact this office.      Cuidados prenatales (Prenatal Care) QU SON LOS CUIDADOS PRENATALES? Los cuidados prenatales son [place] se brindan a una embarazada antes del [place]. Los cuidados prenatales garantizan que la embarazada y el feto estn tan sanos como sea posible durante todo el [place]. Pueden brindar [place] tipo de cuidados [place], un mdico de atencin primaria o un  especialista en parto y Media planner ([place]). Los cuidados prenatales incluyen exmenes fsicos, estudios, tratamientos e informacin sobre nutricin, estilo de vida y servicios de apoyo social. POR QU SON TAN IMPORTANTES LOS CUIDADOS PRENATALES? Los cuidados prenatales recibidos desde un inicio y de forma peridica aumentan la probabilidad de que usted y el beb permanezcan sanos durante todo el [place]. Este tipo de cuidados tambin reduce el riesgo de que el beb nazca mucho antes de la fecha probable de parto (prematuro) o de que sea ms pequeo de lo previsto (pequeo para la edad gestacional). Durante las visitas prenatales, se Programme researcher, broadcasting/film/video clase de enfermedad preexistente que usted pueda tener y que represente un riesgo durante el Media planner. Tambin la monitorearn con regularidad para Actuary afeccin que pueda surgir Solicitor, a fin de tratarla con rapidez y eficacia. QU SUCEDE DURANTE LAS VISITAS PRENATALES? Las visitas prenatales pueden incluir lo siguiente: Dilogo  Informe al mdico cualquier signo o sntoma nuevo que haya tenido desde la ltima visita. Estos pueden incluir los siguientes:  Nuseas o vmitos.  Aumento o disminucin del nivel de [place].  Dificultad para dormir.  Dolor en la espalda o las piernas.  Cambios en [employer].  Ganas frecuentes de Garment/textile technologist.  Falta de aire al realizar actividad fsica.  Cambios en la piel, por ejemplo, una erupcin cutnea o picazn.  Sangrado o flujo vaginal.  Sensacin de excitacin o nerviosismo.  Cambios en los movimientos del feto. Es conveniente que escriba cualquier pregunta o tema del que quiera hablar con el mdico, para llevarlo anotado a la cita. Exmenes  Durante la primera visita prenatal, es probable que le hagan un examen fsico completo. El [employer] revisar con frecuencia la vagina, el cuello del tero y la posicin del tero, adems de examinarle el corazn, los pulmones y otras partes  del cuerpo. A medida que el embarazo avance, el mdico medir el tamao del tero y Oncologist posicin del feto dentro del tero. Tambin puede examinarla para [facility] [facility] primeros signos del [place] de [place]. Las visitas prenatales tambin pueden incluir el control de la presin arterial y, despus de 10 a 73semanas de embarazo, aproximadamente, el control de los latidos del feto. Estudios  Los estudios habituales suelen incluir lo siguiente:  Anlisis de [place]. Este anlisis examina la presencia de glucosa, protenas o signos de infeccin en la orina.  Recuento sanguneo. Este anlisis verifica el nivel de glbulos rojos y blancos en el organismo.  Pruebas de enfermedades de transmisin sexual (ETS). Las pruebas de Programme researcher, broadcasting/film/video de ETS al comienzo del embarazo son Ardelia Mems prctica de rutina, y en muchos estados es obligacin practicarlas.  Anlisis de anticuerpos. La examinarn para ver si es inmune a determinadas enfermedades, como la [place], que puede afectar al feto en desarrollo.  Deteccin de glucosa. Entre la semana 24y 28de embarazo, le analizarn el nivel de glucemia para detectar signos de diabetes gestacional. Pueden recomendarle un anlisis de seguimiento.  Estreptococos del grupoB. Es comn encontrar estas bacterias dentro de la vagina. Este [employer] indicar al mdico si necesita darle un antibitico para reducir la cantidad de este tipo de bacterias en el cuerpo antes del trabajo de parto y [place].  Ecografas. Alrededor de la semana 18a 20de embarazo, muchas embarazadas se hacen ecografas para evaluar la salud del feto y Hydrographic surveyor cualquier anomala en el desarrollo.  Prueba del VIH (virus de inmunodeficiencia humana). Al comienzo del Media planner, le harn una prueba de deteccin del VIH. Si corre un riesgo alto de [place] VIH, pueden repetirle esta prueba durante el tercer trimestre del embarazo. Pueden indicarle otro tipo de estudios segn su edad, sus antecedentes mdicos  personales o familiares, u otros factores. [place] PARA LOS CUIDADOS PRENATALES? El programa de control correspondiente a los cuidados prenatales depender de cualquier enfermedad que usted tenga desde antes del embarazo o que haya desarrollado durante el mismo. Si usted no tiene Fish farm manager, es probable que le hagan los siguientes controles:  Furniture conservator/restorer vez al mes durante los primeros 64meses de [place].  Dos veces al mes durante el sptimo y el octavo mes de [place].  Una vez a la [employer] noveno mes de Media planner y [place]. Si presenta signos de trabajo de parto prematuro u otros signos o sntomas preocupantes, es posible que deba ver al mdico con ms frecuencia. Consulte al [employer] programa de cuidados prenatales ms adecuado para su caso. QU PUEDO HACER PARA QUE EL BEB Y YO ESTEMOS TAN SANOS COMO SEA POSIBLE DURANTE EL EMBARAZO?  Tome una vitamina prenatal que contenga 452microgramos (0,4mg ) de cido flico [employer]. El mdico tambin puede indicarle que tome vitaminas adicionales, como yodo, vitaminaD, hierro, cobre y zinc.  [place] de 1500 a 2000mg  de calcio [employer] desde la [id number] de Research scientist (medical).  [place]. A menos que el mdico le indique otra cosa:  Debe aplicarse la vacuna contra la difteria, el ttanos y la tosferina (Tdap) entre la [id number] y 36de embarazo, independientemente de la fecha en la que recibi la ltima vacuna Tdap. Esta vacuna ayuda a proteger al beb contra la tosferina despus del nacimiento.  Debe recibir Ardelia Mems vacuna antigripal inactivada (IIV) anual como ayuda para protegerlos a usted y al beb de la gripe. Puede recibirla en cualquier momento del embarazo.  Siga una dieta bien equilibrada, que incluya lo siguiente:  Lambert Mody y verduras frescas.  Protenas magras.  Alimentos con [employer] de calcio, [place], [place], quesos  duros y verduras de hojas color verde oscuro.  Panes integrales.  No coma frutos de mar con alto contenido de mercurio, por ejemplo:  Pez espada.  [place].  [place].  [place].  Ms de Lyda Kalata de atn por semana.  No coma lo siguiente:  Carnes o huevos crudos o mal cocidos.  Alimentos no pasteurizados, como quesos blandos (brie, [place] o feta), jugos y [place].  Embutidos.  Salchichas que no se cocinaron en agua hirviendo.  Beba suficiente agua para mantener la orina clara o de color amarillo plido. Para muchas mujeres, la cantidad es de 10 o ms vasos de 8onzas de Nurse, children's. El hecho de mantenerse hidratada ayuda a que el feto reciba nutrientes y Product manager el inicio de contracciones uterinas prematuras.  No consuma ningn producto que contenga tabaco, como cigarrillos, tabaco de Higher education careers adviser o Psychologist, sport and exercise. Si necesita ayuda para dejar  de fumar, consulte al mdico.  No consuma bebidas que contengan alcohol. No se ha determinado que haya un nivel de consumo de alcohol que sea inocuo durante el [place].  No consuma drogas. Estas pueden daar al feto en desarrollo o causar un aborto espontneo.  Consulte al mdico o al farmacutico antes de tomar cualquier medicamento recetado o de venta libre, hierbas o suplementos.  Limite el consumo de cafena a no ms de 200mg  por da.  Haga actividad fsica. A menos que el mdico le indique otra cosa, intente hacer 20minutos de ejercicio moderado la mayora de los das de la [place]. No practique actividades de alto impacto, deportes de contacto o actividades con alto riesgo de cadas, como equitacin o esqu extremo.  Descanse lo suficiente.  Evite todo aquello que aumente la temperatura corporal, como jacuzzis y saunas.  Si tiene un gato, no vace la bandeja sanitaria. Las bacterias presentes en las heces del gato pueden causar una infeccin llamada toxoplasmosis. Esta puede daar gravemente al feto.  Aljese de las  sustancias qumicas como insecticidas, plomo y [place], y de los productos de limpieza o pinturas que contengan solventes.  No se saque ninguna radiografa, excepto si es necesaria por razones mdicas.  Tome una clase de preparacin para el parto y Customer service manager. Pregntele al mdico si necesita una derivacin o una recomendacin. Esta informacin no tiene Marine scientist el consejo del mdico. Asegrese de hacerle al mdico cualquier pregunta que tenga. Document Released: 04/13/2008 Document Revised: 02/17/2016 Document Reviewed: 01/10/2014 Elsevier Interactive Patient Education  2017 Elsevier Inc.      Agustina Caroli, MD [facility]

## 2016-12-23 NOTE — Patient Instructions (Addendum)
IF you received an x-ray today, you will receive an invoice from [facility]. Please contact [facility]iology at [phone] with questions or concerns regarding your invoice.   IF you received labwork today, you will receive an invoice from [place]. Please contact [facility] at [phone] with questions or concerns regarding your invoice.   Our billing staff will not be able to assist you with questions regarding bills from these companies.  You will be contacted with the lab results as soon as they are available. The fastest way to get your results is to activate your My Chart account. Instructions are located on the last page of this paperwork. If you have not heard from [place] regarding the results in 2 weeks, please contact this office.      Cuidados prenatales (Prenatal Care) QU SON LOS CUIDADOS PRENATALES? Los cuidados prenatales son [place] se brindan a una embarazada antes del [place]. Los cuidados prenatales garantizan que la embarazada y el feto estn tan sanos como sea posible durante todo el [place]. Pueden brindar [place] tipo de cuidados [place], un mdico de atencin primaria o un especialista en parto y Media planner ([place]). Los cuidados prenatales incluyen exmenes fsicos, estudios, tratamientos e informacin sobre nutricin, estilo de vida y servicios de apoyo social. POR QU SON TAN IMPORTANTES LOS CUIDADOS PRENATALES? Los cuidados prenatales recibidos desde un inicio y de forma peridica aumentan la probabilidad de que usted y el beb permanezcan sanos durante todo el [place]. Este tipo de cuidados tambin reduce el riesgo de que el beb nazca mucho antes de la fecha probable de parto (prematuro) o de que sea ms pequeo de lo previsto (pequeo para la edad gestacional). Durante las visitas prenatales, se Programme researcher, broadcasting/film/video clase de enfermedad preexistente que usted pueda tener y que represente un riesgo durante el Media planner. Tambin la monitorearn con  regularidad para Actuary afeccin que pueda surgir Solicitor, a fin de tratarla con rapidez y eficacia. QU SUCEDE DURANTE LAS VISITAS PRENATALES? Las visitas prenatales pueden incluir lo siguiente: [place]  Informe al mdico cualquier signo o sntoma nuevo que haya tenido desde la ltima visita. Estos pueden incluir los siguientes:  Nuseas o vmitos.  Aumento o disminucin del nivel de [place].  Dificultad para dormir.  Dolor en la espalda o las piernas.  Cambios en [employer].  Ganas frecuentes de Garment/textile technologist.  Falta de aire al realizar actividad fsica.  Cambios en la piel, por ejemplo, una erupcin cutnea o picazn.  Sangrado o flujo vaginal.  Sensacin de excitacin o nerviosismo.  Cambios en los movimientos del feto. Es conveniente que escriba cualquier pregunta o tema del que quiera hablar con el mdico, para llevarlo anotado a la cita. Exmenes  Durante la primera visita prenatal, es probable que le hagan un examen fsico completo. El [employer] revisar con frecuencia la vagina, el cuello del tero y la posicin del tero, adems de examinarle el corazn, los pulmones y otras partes del cuerpo. A medida que el embarazo avance, el mdico medir el tamao del tero y Oncologist posicin del feto dentro del tero. Tambin puede examinarla para [facility] primeros signos del [place] de [place]. Las visitas prenatales tambin pueden incluir el control de la presin arterial y, despus de 10 a 30semanas de embarazo, aproximadamente, el control de los latidos del feto. Estudios  Los estudios habituales suelen incluir lo siguiente:  Anlisis de [place]. Este anlisis examina la presencia de glucosa, protenas o signos de infeccin en la orina.  Recuento sanguneo. Este anlisis verifica el nivel de glbulos rojos y blancos en el organismo.  Pruebas de enfermedades de transmisin sexual (ETS). Las pruebas de Programme researcher, broadcasting/film/video de ETS al comienzo del embarazo son Ardelia Mems  prctica de rutina, y en muchos estados es obligacin practicarlas.  Anlisis de anticuerpos. La examinarn para ver si es inmune a determinadas enfermedades, como la [place], que puede afectar al feto en desarrollo.  Deteccin de glucosa. Entre la semana 24y 28de embarazo, le analizarn el nivel de glucemia para detectar signos de diabetes gestacional. Pueden recomendarle un anlisis de seguimiento.  Estreptococos del grupoB. Es comn encontrar estas bacterias dentro de la vagina. Este [employer] indicar al mdico si necesita darle un antibitico para reducir la cantidad de este tipo de bacterias en el cuerpo antes del trabajo de parto y [place].  Ecografas. Alrededor de la semana 18a 20de embarazo, muchas embarazadas se hacen ecografas para evaluar la salud del feto y Hydrographic surveyor cualquier anomala en el desarrollo.  Prueba del VIH (virus de inmunodeficiencia humana). Al comienzo del Media planner, le harn una prueba de deteccin del VIH. Si corre un riesgo alto de [place] VIH, pueden repetirle esta prueba durante el tercer trimestre del embarazo. Pueden indicarle otro tipo de estudios segn su edad, sus antecedentes mdicos personales o familiares, u otros factores. [place] PARA LOS CUIDADOS PRENATALES? El programa de control correspondiente a los cuidados prenatales depender de cualquier enfermedad que usted tenga desde antes del embarazo o que haya desarrollado durante el mismo. Si usted no tiene Fish farm manager, es probable que le hagan los siguientes controles:  Furniture conservator/restorer vez al mes durante los primeros 55meses de [place].  Dos veces al mes durante el sptimo y el octavo mes de [place].  Una vez a la [employer] noveno mes de Media planner y [place]. Si presenta signos de trabajo de parto prematuro u otros signos o sntomas preocupantes, es posible que deba ver al mdico con ms frecuencia. Consulte al [employer] programa de cuidados  prenatales ms adecuado para su caso. QU PUEDO HACER PARA QUE EL BEB Y YO ESTEMOS TAN SANOS COMO SEA POSIBLE DURANTE EL EMBARAZO?  Tome una vitamina prenatal que contenga 450microgramos (0,4mg ) de cido flico [employer]. El mdico tambin puede indicarle que tome vitaminas adicionales, como yodo, vitaminaD, hierro, cobre y zinc.  [place] de 1500 a 2000mg  de [employer] desde la [id number] de Research scientist (medical).  [place]. A menos que el mdico le indique otra cosa:  Debe aplicarse la vacuna contra la difteria, el ttanos y la tosferina (Tdap) entre la [id number] y 36de embarazo, independientemente de la fecha en la que recibi la ltima vacuna Tdap. Esta vacuna ayuda a proteger al beb contra la tosferina despus del nacimiento.  Debe recibir Ardelia Mems vacuna antigripal inactivada (IIV) anual como ayuda para protegerlos a usted y al beb de la gripe. Puede recibirla en cualquier momento del embarazo.  Siga una dieta bien equilibrada, que incluya lo siguiente:  Lambert Mody y verduras frescas.  Protenas magras.  Alimentos con [employer] de calcio, [place], [place], quesos duros y verduras de hojas color verde oscuro.  Panes integrales.  No coma frutos de mar con alto contenido de mercurio, por ejemplo:  Pez espada.  [place].  [place].  [place].  Ms de Lyda Kalata de atn por semana.  No coma lo siguiente:  Carnes o huevos crudos o mal cocidos.  Alimentos no pasteurizados, como quesos blandos (brie, [place] o feta), jugos y [place].  Embutidos.  Salchichas que no se cocinaron en agua hirviendo.  Beba suficiente agua para mantener la orina clara o de color amarillo plido. Para muchas mujeres, la cantidad es de 10 o ms vasos de 8onzas de Nurse, children's. El hecho de mantenerse hidratada ayuda a que el feto reciba nutrientes y Product manager el inicio de contracciones uterinas prematuras.  No consuma ningn producto que contenga  tabaco, como cigarrillos, tabaco de Higher education careers adviser o Psychologist, sport and exercise. Si necesita ayuda para dejar de fumar, consulte al mdico.  No consuma bebidas que contengan alcohol. No se ha determinado que haya un nivel de consumo de alcohol que sea inocuo durante el [place].  No consuma drogas. Estas pueden daar al feto en desarrollo o causar un aborto espontneo.  Consulte al mdico o al farmacutico antes de tomar cualquier medicamento recetado o de venta libre, hierbas o suplementos.  Limite el consumo de cafena a no ms de 200mg  por da.  Haga actividad fsica. A menos que el mdico le indique otra cosa, intente hacer 56minutos de ejercicio moderado la mayora de los das de la [place]. No practique actividades de alto impacto, deportes de contacto o actividades con alto riesgo de cadas, como equitacin o esqu extremo.  Descanse lo suficiente.  Evite todo aquello que aumente la temperatura corporal, como jacuzzis y saunas.  Si tiene un gato, no vace la bandeja sanitaria. Las bacterias presentes en las heces del gato pueden causar una infeccin llamada toxoplasmosis. Esta puede daar gravemente al feto.  Aljese de las sustancias qumicas como insecticidas, plomo y [place], y de los productos de limpieza o pinturas que contengan solventes.  No se saque ninguna radiografa, excepto si es necesaria por razones mdicas.  Tome una clase de preparacin para el parto y Customer service manager. Pregntele al mdico si necesita una derivacin o una recomendacin. Esta informacin no tiene Marine scientist el consejo del mdico. Asegrese de hacerle al mdico cualquier pregunta que tenga. Document Released: 04/13/2008 Document Revised: 02/17/2016 Document Reviewed: 01/10/2014 Elsevier Interactive Patient Education  2017 [employer].

## 2017-01-13 ENCOUNTER — Encounter (HOSPITAL_COMMUNITY): Payer: Self-pay

## 2017-01-13 ENCOUNTER — Inpatient Hospital Stay (HOSPITAL_COMMUNITY): Payer: Self-pay

## 2017-01-13 ENCOUNTER — Inpatient Hospital Stay (HOSPITAL_COMMUNITY)
Admission: AD | Admit: 2017-01-13 | Discharge: 2017-01-13 | Disposition: A | Discharge status: Home / Self Care | Payer: Self-pay | Source: Ambulatory Visit | Attending: Obstetrics & Gynecology | Admitting: Obstetrics & Gynecology

## 2017-01-13 DIAGNOSIS — O99331 Smoking (tobacco) complicating pregnancy, first trimester: Secondary | ICD-10-CM | POA: Insufficient documentation

## 2017-01-13 DIAGNOSIS — O209 Hemorrhage in early pregnancy, unspecified: Secondary | ICD-10-CM | POA: Insufficient documentation

## 2017-01-13 DIAGNOSIS — Z3A01 Less than 8 weeks gestation of pregnancy: Secondary | ICD-10-CM | POA: Insufficient documentation

## 2017-01-13 DIAGNOSIS — F17210 Nicotine dependence, cigarettes, uncomplicated: Secondary | ICD-10-CM | POA: Insufficient documentation

## 2017-01-13 DIAGNOSIS — Z3490 Encounter for supervision of normal pregnancy, unspecified, unspecified trimester: Secondary | ICD-10-CM

## 2017-01-13 LAB — OB RESULTS CONSOLE ABO/RH: RH Type: POSITIVE

## 2017-01-13 LAB — URINALYSIS, ROUTINE W REFLEX MICROSCOPIC
Bilirubin Urine: NEGATIVE
GLUCOSE, UA: NEGATIVE mg/dL
Ketones, ur: NEGATIVE mg/dL
NITRITE: NEGATIVE
PH: 7 (ref 5.0–8.0)
PROTEIN: NEGATIVE mg/dL
SPECIFIC GRAVITY, URINE: 1.018 (ref 1.005–1.030)

## 2017-01-13 LAB — OB RESULTS CONSOLE HEPATITIS B SURFACE ANTIGEN: Hepatitis B Surface Ag: NEGATIVE

## 2017-01-13 LAB — CBC
HEMATOCRIT: 37.7 % (ref 36.0–46.0)
HEMOGLOBIN: 13.3 g/dL (ref 12.0–15.0)
MCH: 32.2 pg (ref 26.0–34.0)
MCHC: 35.3 g/dL (ref 30.0–36.0)
MCV: 91.3 fL (ref 78.0–100.0)
Platelets: 253 10*3/uL (ref 150–400)
RBC: 4.13 MIL/uL (ref 3.87–5.11)
RDW: 12.4 % (ref 11.5–15.5)
WBC: 11 10*3/uL — ABNORMAL HIGH (ref 4.0–10.5)

## 2017-01-13 LAB — OB RESULTS CONSOLE RUBELLA ANTIBODY, IGM: RUBELLA: IMMUNE

## 2017-01-13 LAB — WET PREP, GENITAL
CLUE CELLS WET PREP: NONE SEEN
SPERM: NONE SEEN
TRICH WET PREP: NONE SEEN
Yeast Wet Prep HPF POC: NONE SEEN

## 2017-01-13 LAB — OB RESULTS CONSOLE RPR: RPR: NONREACTIVE

## 2017-01-13 LAB — POCT PREGNANCY, URINE: Preg Test, Ur: POSITIVE — AB

## 2017-01-13 LAB — OB RESULTS CONSOLE HIV ANTIBODY (ROUTINE TESTING): HIV: NONREACTIVE

## 2017-01-13 LAB — OB RESULTS CONSOLE ANTIBODY SCREEN: ANTIBODY SCREEN: NEGATIVE

## 2017-01-13 LAB — HCG, QUANTITATIVE, PREGNANCY: hCG, Beta Chain, Quant, S: 45066 m[IU]/mL — ABNORMAL HIGH (ref <5)

## 2017-01-13 LAB — OB RESULTS CONSOLE GC/CHLAMYDIA
Chlamydia: NEGATIVE
GC PROBE AMP, GENITAL: NEGATIVE

## 2017-01-13 NOTE — MAU Provider Note (Signed)
History     CSN: [id number]  Arrival date and time: 01/13/17 2059   First Provider Initiated Contact with Patient 01/13/17 2137      Chief Complaint  Patient presents with  . Vaginal Bleeding   Vaginal Bleeding  The patient's primary symptoms include pelvic pain and vaginal bleeding. This is a new problem. The current episode started today. The problem occurs constantly. The problem has been unchanged. Pain severity now: 5/10  The problem affects the right side. She is pregnant. Associated symptoms include nausea and vomiting. Pertinent negatives include no chills, dysuria, fever, frequency or urgency. The vaginal bleeding is lighter than menses. She has not been passing clots. She has not been passing tissue. Nothing aggravates the symptoms. She has tried nothing for the symptoms. Sexual activity: No intercourse in the last 24 hours.  Her menstrual history has been regular (LMP: 11/23/16 ).    Past Medical History:  Diagnosis Date  . Medical history non-contributory     Past Surgical History:  Procedure Laterality Date  . LAPAROSCOPIC APPENDECTOMY N/A 09/29/2016   Procedure: APPENDECTOMY LAPAROSCOPIC;  Surgeon: Greer Pickerel, MD;  Location: [facility];  Service: General;  Laterality: N/A;  . NO PAST SURGERIES      Family History  Problem Relation Age of Onset  . Asthma Mother     Social History  Substance Use Topics  . Smoking status: Current Every Day Smoker    Packs/day: 0.25    Years: 10.00    Types: Cigarettes  . Smokeless tobacco: Never Used  . Alcohol use No    Allergies: No Known Allergies  Prescriptions Prior to Admission  Medication Sig Dispense Refill Last Dose  . Prenatal Vit-Fe Fumarate-FA (PRENATAL MULTIVITAMIN) TABS tablet Take 1 tablet by mouth daily at 12 noon.   01/13/2017 at Unknown time  . amoxicillin-clavulanate (AUGMENTIN) 875-125 MG tablet Take 1 tablet by mouth 2 (two) times daily. (Patient not taking: Reported on 01/13/2017) 14 tablet 3 Not Taking at  Unknown time  . oxyCODONE (OXY IR/ROXICODONE) 5 MG immediate release tablet Take 1-2 tablets (5-10 mg total) by mouth every 4 (four) hours as needed for moderate pain. (Patient not taking: Reported on 01/13/2017) 30 tablet 0 Completed Course at Unknown time  . Prenatal Vit-Fe Fum-FA-Omega (PNV PRENATAL PLUS MULTIVIT+DHA) 27-1 & 312 MG MISC Take 1 tablet by mouth daily. (Patient not taking: Reported on 09/28/2016) 90 each 3 Not Taking at Unknown time    Review of Systems  Constitutional: Negative for chills and fever.  Gastrointestinal: Positive for nausea and vomiting.  Genitourinary: Positive for pelvic pain and vaginal bleeding. Negative for dysuria, frequency and urgency.   Physical Exam   Blood pressure 104/65, pulse 81, temperature 98.1 F (36.7 C), temperature source Oral, resp. rate 18, last menstrual period 11/23/2016, unknown if currently breastfeeding.  Physical Exam  Nursing note and vitals reviewed. Constitutional: She is oriented to person, place, and time. She appears well-developed and well-nourished. No distress.  HENT:  Head: Normocephalic.  Cardiovascular: Normal rate.   Respiratory: Effort normal.  GI: Soft. There is no tenderness. There is no rebound.  Neurological: She is alert and oriented to person, place, and time.  Skin: Skin is warm and dry.  Psychiatric: She has a normal mood and affect.    Results for orders placed or performed during the hospital encounter of 01/13/17 (from the past 24 hour(s))  Urinalysis, Routine w reflex microscopic (not at [facility])     Status: Abnormal   Collection Time:  01/13/17  9:06 PM  Result Value Ref Range   Color, Urine YELLOW YELLOW   APPearance CLOUDY (A) CLEAR   Specific Gravity, Urine 1.018 1.005 - 1.030   pH 7.0 5.0 - 8.0   Glucose, UA NEGATIVE NEGATIVE mg/dL   Hgb urine dipstick MODERATE (A) NEGATIVE   Bilirubin Urine NEGATIVE NEGATIVE   Ketones, ur NEGATIVE NEGATIVE mg/dL   Protein, ur NEGATIVE NEGATIVE mg/dL    Nitrite NEGATIVE NEGATIVE   Leukocytes, UA TRACE (A) NEGATIVE   RBC / HPF 0-5 0 - 5 RBC/hpf   WBC, UA 6-30 0 - 5 WBC/hpf   Bacteria, UA RARE (A) NONE SEEN   Squamous Epithelial / LPF 6-30 (A) NONE SEEN   Mucous PRESENT   Pregnancy, urine POC     Status: Abnormal   Collection Time: 01/13/17  9:21 PM  Result Value Ref Range   Preg Test, Ur POSITIVE (A) NEGATIVE  Wet prep, genital     Status: Abnormal   Collection Time: 01/13/17  9:45 PM  Result Value Ref Range   Yeast Wet Prep HPF POC NONE SEEN NONE SEEN   Trich, Wet Prep NONE SEEN NONE SEEN   Clue Cells Wet Prep HPF POC NONE SEEN NONE SEEN   WBC, Wet Prep HPF POC MODERATE (A) NONE SEEN   Sperm NONE SEEN   CBC     Status: Abnormal   Collection Time: 01/13/17  9:55 PM  Result Value Ref Range   WBC 11.0 (H) 4.0 - 10.5 K/uL   RBC 4.13 3.87 - 5.11 MIL/uL   Hemoglobin 13.3 12.0 - 15.0 g/dL   HCT 37.7 36.0 - 46.0 %   MCV 91.3 78.0 - 100.0 fL   MCH 32.2 26.0 - 34.0 pg   MCHC 35.3 30.0 - 36.0 g/dL   RDW 12.4 11.5 - 15.5 %   Platelets 253 150 - 400 K/uL  ABO/Rh     Status: None (Preliminary result)   Collection Time: 01/13/17  9:55 PM  Result Value Ref Range   ABO/RH(D) A POS   hCG, quantitative, pregnancy     Status: Abnormal   Collection Time: 01/13/17  9:55 PM  Result Value Ref Range   hCG, Beta Chain, Quant, S 45,066 (H) <5 mIU/mL   US Ob Comp Less 14 Wks  Result Date: 01/13/2017 CLINICAL DATA:  Vaginal bleeding today. Intermittent lower abdominal pain. EXAM: OBSTETRIC <14 WK [place] AND TRANSVAGINAL OB US TECHNIQUE: Both transabdominal and transvaginal ultrasound examinations were performed for complete evaluation of the gestation as well as the maternal uterus, adnexal regions, and pelvic cul-de-sac. Transvaginal technique was performed to assess early pregnancy. COMPARISON:  None. FINDINGS: Intrauterine gestational sac: Single Yolk sac:  Visible Embryo:  Visible Cardiac Activity: Visible Heart Rate: 137  bpm MSD:   mm    w     d CRL:   9.3  mm   6 w   6 d                  [place] EDC: 09/02/2017 Subchorionic hemorrhage:  Small Maternal uterus/adnexae: Normal ovaries.  Trace free pelvic fluid. IMPRESSION: Single living intrauterine gestation measuring 6 weeks 6 days by crown-rump length. Electronically Signed   By: Andreas Newport M.D.   On: 01/13/2017 22:58   US Ob Transvaginal  Result Date: 01/13/2017 CLINICAL DATA:  Vaginal bleeding today. Intermittent lower abdominal pain. EXAM: OBSTETRIC <14 WK [place] AND TRANSVAGINAL OB US TECHNIQUE: Both transabdominal and transvaginal ultrasound examinations were performed for  complete evaluation of the gestation as well as the maternal uterus, adnexal regions, and pelvic cul-de-sac. Transvaginal technique was performed to assess early pregnancy. COMPARISON:  None. FINDINGS: Intrauterine gestational sac: Single Yolk sac:  Visible Embryo:  Visible Cardiac Activity: Visible Heart Rate: 137  bpm MSD:   mm    w     d CRL:  9.3  mm   6 w   6 d                  [place] EDC: 09/02/2017 Subchorionic hemorrhage:  Small Maternal uterus/adnexae: Normal ovaries.  Trace free pelvic fluid. IMPRESSION: Single living intrauterine gestation measuring 6 weeks 6 days by crown-rump length. Electronically Signed   By: Andreas Newport M.D.   On: 01/13/2017 22:58    MAU Course  Procedures  MDM   Assessment and Plan   1. Intrauterine pregnancy   2. Vaginal bleeding in pregnancy, first trimester    DC home Comfort measures reviewed  1st Trimester precautions  Bleeding precautions RX: none  Return to MAU as needed FU with OB as planned  Follow-up Information    [facility] Follow up.   Contact information: [place] [address] [phone]            Mathis Bud 01/13/2017, 9:41 PM

## 2017-01-13 NOTE — MAU Note (Signed)
Patient presents with c/o of vaginal spotting that started today at 1900. Patient is also having sharp pain in her lower abdomen that comes and goes.

## 2017-01-13 NOTE — Discharge Instructions (Signed)
First Trimester of Pregnancy The first trimester of pregnancy is from week 1 until the end of week 13 (months 1 through 3). A week after a sperm fertilizes an egg, the egg will implant on the wall of the uterus. This embryo will begin to develop into a baby. Genes from you and your partner will form the baby. The female genes will determine whether the baby will be a boy or a girl. At 6-8 weeks, the eyes and face will be formed, and the heartbeat can be seen on ultrasound. At the end of 12 weeks, all the baby's organs will be formed. Now that you are pregnant, you will want to do everything you can to have a healthy baby. Two of the most important things are to get good prenatal care and to follow your health care provider's instructions. Prenatal care is all the medical care you receive before the baby's birth. This care will help prevent, find, and treat any problems during the pregnancy and childbirth. Body changes during your first trimester Your body goes through many changes during pregnancy. The changes vary from woman to woman.  You may gain or lose a couple of pounds at first.  You may feel sick to your stomach (nauseous) and you may throw up (vomit). If the vomiting is uncontrollable, call your health care provider.  You may tire easily.  You may develop headaches that can be relieved by medicines. All medicines should be approved by your health care provider.  You may urinate more often. Painful urination may mean you have a bladder infection.  You may develop heartburn as a result of your pregnancy.  You may develop constipation because certain hormones are causing the muscles that push stool through your intestines to slow down.  You may develop hemorrhoids or swollen veins (varicose veins).  Your breasts may begin to grow larger and become tender. Your nipples may stick out more, and the tissue that surrounds them (areola) may become darker.  Your gums may bleed and may be  sensitive to brushing and flossing.  Dark spots or blotches (chloasma, mask of pregnancy) may develop on your face. This will likely fade after the baby is born.  Your menstrual periods will stop.  You may have a loss of appetite.  You may develop cravings for certain kinds of food.  You may have changes in your emotions from day to day, such as being excited to be pregnant or being concerned that something may go wrong with the pregnancy and baby.  You may have more vivid and strange dreams.  You may have changes in your hair. These can include thickening of your hair, rapid growth, and changes in texture. Some women also have hair loss during or after pregnancy, or hair that feels dry or thin. Your hair will most likely return to normal after your baby is born.  What to expect at prenatal visits During a routine prenatal visit:  You will be weighed to make sure you and the baby are growing normally.  Your blood pressure will be taken.  Your abdomen will be measured to track your baby's growth.  The fetal heartbeat will be listened to between weeks 10 and 14 of your pregnancy.  Test results from any previous visits will be discussed.  Your health care provider may ask you:  How you are feeling.  If you are feeling the baby move.  If you have had any abnormal symptoms, such as leaking fluid, bleeding, severe headaches,   or abdominal cramping.  If you are using any tobacco products, including cigarettes, chewing tobacco, and electronic cigarettes.  If you have any questions.  Other tests that may be performed during your first trimester include:  Blood tests to find your blood type and to check for the presence of any previous infections. The tests will also be used to check for low iron levels (anemia) and protein on red blood cells (Rh antibodies). Depending on your risk factors, or if you previously had diabetes during pregnancy, you may have tests to check for high blood  sugar that affects pregnant women (gestational diabetes).  Urine tests to check for infections, diabetes, or protein in the urine.  An ultrasound to confirm the proper growth and development of the baby.  Fetal screens for spinal cord problems (spina bifida) and Down syndrome.  HIV (human immunodeficiency virus) testing. Routine prenatal testing includes screening for HIV, unless you choose not to have this test.  You may need other tests to make sure you and the baby are doing well.  Follow these instructions at home: Medicines  Follow your health care provider's instructions regarding medicine use. Specific medicines may be either safe or unsafe to take during pregnancy.  Take a prenatal vitamin that contains at least 600 micrograms (mcg) of folic acid.  If you develop constipation, try taking a stool softener if your health care provider approves. Eating and drinking  Eat a balanced diet that includes fresh fruits and vegetables, whole grains, good sources of protein such as meat, eggs, or tofu, and low-fat dairy. Your health care provider will help you determine the amount of weight gain that is right for you.  Avoid raw meat and uncooked cheese. These carry germs that can cause birth defects in the baby.  Eating four or five small meals rather than three large meals a day may help relieve nausea and vomiting. If you start to feel nauseous, eating a few soda crackers can be helpful. Drinking liquids between meals, instead of during meals, also seems to help ease nausea and vomiting.  Limit foods that are high in fat and processed sugars, such as fried and sweet foods.  To prevent constipation: ? Eat foods that are high in fiber, such as fresh fruits and vegetables, whole grains, and beans. ? Drink enough fluid to keep your urine clear or pale yellow. Activity  Exercise only as directed by your health care provider. Most women can continue their usual exercise routine during  pregnancy. Try to exercise for 30 minutes at least 5 days a week. Exercising will help you: ? Control your weight. ? Stay in shape. ? Be prepared for labor and delivery.  Experiencing pain or cramping in the lower abdomen or lower back is a good sign that you should stop exercising. Check with your health care provider before continuing with normal exercises.  Try to avoid standing for long periods of time. Move your legs often if you must stand in one place for a long time.  Avoid heavy lifting.  Wear low-heeled shoes and practice good posture.  You may continue to have sex unless your health care provider tells you not to. Relieving pain and discomfort  Wear a good support bra to relieve breast tenderness.  Take warm sitz baths to soothe any pain or discomfort caused by hemorrhoids. Use hemorrhoid cream if your health care provider approves.  Rest with your legs elevated if you have leg cramps or low back pain.  If you develop   varicose veins in your legs, wear support hose. Elevate your feet for 15 minutes, 3-4 times a day. Limit salt in your diet. Prenatal care  Schedule your prenatal visits by the twelfth week of pregnancy. They are usually scheduled monthly at first, then more often in the last 2 months before delivery.  Write down your questions. Take them to your prenatal visits.  Keep all your prenatal visits as told by your health care provider. This is important. Safety  Wear your seat belt at all times when driving.  Make a list of emergency phone numbers, including numbers for family, friends, the hospital, and police and fire departments. General instructions  Ask your health care provider for a referral to a local prenatal education class. Begin classes no later than the beginning of month 6 of your pregnancy.  Ask for help if you have counseling or nutritional needs during pregnancy. Your health care provider can offer advice or refer you to specialists for help  with various needs.  Do not use hot tubs, steam rooms, or saunas.  Do not douche or use tampons or scented sanitary pads.  Do not cross your legs for long periods of time.  Avoid cat litter boxes and soil used by cats. These carry germs that can cause birth defects in the baby and possibly loss of the fetus by miscarriage or stillbirth.  Avoid all smoking, herbs, alcohol, and medicines not prescribed by your health care provider. Chemicals in these products affect the formation and growth of the baby.  Do not use any products that contain nicotine or tobacco, such as cigarettes and e-cigarettes. If you need help quitting, ask your health care provider. You may receive counseling support and other resources to help you quit.  Schedule a dentist appointment. At home, brush your teeth with a soft toothbrush and be gentle when you floss. Contact a health care provider if:  You have dizziness.  You have mild pelvic cramps, pelvic pressure, or nagging pain in the abdominal area.  You have persistent nausea, vomiting, or diarrhea.  You have a bad smelling vaginal discharge.  You have pain when you urinate.  You notice increased swelling in your face, hands, legs, or ankles.  You are exposed to fifth disease or chickenpox.  You are exposed to German measles (rubella) and have never had it. Get help right away if:  You have a fever.  You are leaking fluid from your vagina.  You have spotting or bleeding from your vagina.  You have severe abdominal cramping or pain.  You have rapid weight gain or loss.  You vomit blood or material that looks like coffee grounds.  You develop a severe headache.  You have shortness of breath.  You have any kind of trauma, such as from a fall or a car accident. Summary  The first trimester of pregnancy is from week 1 until the end of week 13 (months 1 through 3).  Your body goes through many changes during pregnancy. The changes vary from  woman to woman.  You will have routine prenatal visits. During those visits, your health care provider will examine you, discuss any test results you may have, and talk with you about how you are feeling. This information is not intended to replace advice given to you by your health care provider. Make sure you discuss any questions you have with your health care provider. Document Released: 10/20/2001 Document Revised: 10/07/2016 Document Reviewed: 10/07/2016 Elsevier Interactive Patient Education  2017 [employer]   Inc.  

## 2017-01-14 LAB — ABO/RH: ABO/RH(D): A POS

## 2017-01-14 LAB — GC/CHLAMYDIA PROBE AMP (~~LOC~~) NOT AT ARMC
Chlamydia: NEGATIVE
Neisseria Gonorrhea: NEGATIVE

## 2017-01-15 LAB — HIV ANTIBODY (ROUTINE TESTING W REFLEX): HIV Screen 4th Generation wRfx: NONREACTIVE

## 2017-01-15 LAB — RPR: RPR Ser Ql: NONREACTIVE

## 2017-02-03 ENCOUNTER — Ambulatory Visit (INDEPENDENT_AMBULATORY_CARE_PROVIDER_SITE_OTHER): Payer: Self-pay | Admitting: Emergency Medicine

## 2017-02-03 VITALS — BP 104/65 | HR 74 | Temp 98.2°F | Resp 16 | Ht 61.0 in | Wt 137.8 lb

## 2017-02-03 DIAGNOSIS — Z716 Tobacco abuse counseling: Secondary | ICD-10-CM

## 2017-02-03 DIAGNOSIS — Z720 Tobacco use: Secondary | ICD-10-CM

## 2017-02-03 DIAGNOSIS — F17209 Nicotine dependence, unspecified, with unspecified nicotine-induced disorders: Secondary | ICD-10-CM | POA: Insufficient documentation

## 2017-02-03 DIAGNOSIS — Z3A08 8 weeks gestation of pregnancy: Secondary | ICD-10-CM

## 2017-02-03 MED ORDER — NICOTINE 7 MG/24HR TD PT24: 7.0000 mg | MEDICATED_PATCH | TRANSDERMAL | 0 refills | Status: DC

## 2017-02-03 NOTE — Progress Notes (Signed)
Meghan Reeves [birth year] 36 y.o.   Chief Complaint  Patient presents with  . Advice Only    wants help to quit smoking due to pregnancy     HISTORY OF PRESENT ILLNESS: This is a 36 y.o. female [redacted] weeks pregnant, smoker, wants help; used to be 1ppd smoker before pregnancy, now down to 2-3 cigarettes/day.  HPI   Prior to Admission medications   Medication Sig Start Date End Date Taking? Authorizing Provider  nicotine (NICODERM CQ) 7 mg/24hr patch Place 1 patch (7 mg total) onto the skin daily. 02/03/17   Horald Pollen, MD    No Known Allergies  Patient Active Problem List   Diagnosis Date Noted  . Less than [redacted] weeks gestation of pregnancy 12/23/2016  . Initiation of Depo Provera 07/29/2015    No past medical history on file.  Past Surgical History:  Procedure Laterality Date  . APPENDECTOMY  09/2016    Social History   Social History  . Marital status: Married    Spouse name: N/A  . Number of children: N/A  . Years of education: N/A   Occupational History  . Not on file.   Social History Main Topics  . Smoking status: Current Every Day Smoker  . Smokeless tobacco: Never Used  . Alcohol use No  . Drug use: No  . Sexual activity: Not on file   Other Topics Concern  . Not on file   Social History Narrative  . No narrative on file    No family history on file.   Review of Systems  Constitutional: Negative for chills and fever.  HENT: Negative.   Eyes: Negative.   Respiratory: Negative for cough and shortness of breath.   Cardiovascular: Negative for chest pain and leg swelling.  Gastrointestinal: Positive for nausea. Negative for abdominal pain, diarrhea and vomiting.  Genitourinary: Negative for dysuria and hematuria.  Musculoskeletal: Negative for back pain and myalgias.  Skin: Negative for rash.  Neurological: Positive for weakness. Negative for dizziness and headaches.  All other systems reviewed and are negative.  Vitals:   02/03/17 1200  BP:  104/65  Pulse: 74  Resp: 16  Temp: 98.2 F (36.8 C)     Physical Exam  Constitutional: She is oriented to person, place, and time. She appears well-developed and well-nourished.  HENT:  Head: Normocephalic and atraumatic.  Nose: Nose normal.  Mouth/Throat: Oropharynx is clear and moist. No oropharyngeal exudate.  Eyes: Conjunctivae and EOM are normal. Pupils are equal, round, and reactive to light.  Neck: Normal range of motion. Neck supple. No JVD present. No thyromegaly present.  Cardiovascular: Normal rate, regular rhythm and normal heart sounds.   Pulmonary/Chest: Effort normal and breath sounds normal.  Abdominal: Soft. There is no tenderness.  Musculoskeletal: Normal range of motion.  Lymphadenopathy:    She has no cervical adenopathy.  Neurological: She is alert and oriented to person, place, and time. No sensory deficit. She exhibits normal muscle tone.  Skin: Skin is warm and dry. Capillary refill takes less than 2 seconds.  Psychiatric: She has a normal mood and affect. Her behavior is normal.  Vitals reviewed.    ASSESSMENT & PLAN: [name] was seen today for advice only.  Diagnoses and all orders for this visit:  Encounter for smoking cessation counseling -     Ambulatory referral to Smoking Cessation Program -     nicotine (NICODERM CQ) 7 mg/24hr patch; Place 1 patch (7 mg total) onto the skin daily.  Tobacco  use disorder, continuous -     Ambulatory referral to Smoking Cessation Program -     nicotine (NICODERM CQ) 7 mg/24hr patch; Place 1 patch (7 mg total) onto the skin daily.  [redacted] weeks gestation of pregnancy      Agustina Caroli, MD [facility]

## 2017-02-04 ENCOUNTER — Ambulatory Visit: Payer: Self-pay | Admitting: Urgent Care

## 2017-03-08 ENCOUNTER — Inpatient Hospital Stay (HOSPITAL_COMMUNITY)
Admission: AD | Admit: 2017-03-08 | Discharge: 2017-03-08 | Disposition: A | Discharge status: Home / Self Care | Payer: Self-pay | Source: Ambulatory Visit | Attending: Family Medicine | Admitting: Family Medicine

## 2017-03-08 ENCOUNTER — Encounter (HOSPITAL_COMMUNITY): Payer: Self-pay

## 2017-03-08 DIAGNOSIS — F17210 Nicotine dependence, cigarettes, uncomplicated: Secondary | ICD-10-CM | POA: Insufficient documentation

## 2017-03-08 DIAGNOSIS — R109 Unspecified abdominal pain: Secondary | ICD-10-CM | POA: Insufficient documentation

## 2017-03-08 DIAGNOSIS — O209 Hemorrhage in early pregnancy, unspecified: Secondary | ICD-10-CM | POA: Insufficient documentation

## 2017-03-08 DIAGNOSIS — O26892 Other specified pregnancy related conditions, second trimester: Secondary | ICD-10-CM | POA: Insufficient documentation

## 2017-03-08 DIAGNOSIS — Z825 Family history of asthma and other chronic lower respiratory diseases: Secondary | ICD-10-CM | POA: Insufficient documentation

## 2017-03-08 DIAGNOSIS — O4692 Antepartum hemorrhage, unspecified, second trimester: Secondary | ICD-10-CM

## 2017-03-08 DIAGNOSIS — O99332 Smoking (tobacco) complicating pregnancy, second trimester: Secondary | ICD-10-CM | POA: Insufficient documentation

## 2017-03-08 DIAGNOSIS — Z9089 Acquired absence of other organs: Secondary | ICD-10-CM | POA: Insufficient documentation

## 2017-03-08 DIAGNOSIS — Z3A15 15 weeks gestation of pregnancy: Secondary | ICD-10-CM | POA: Insufficient documentation

## 2017-03-08 DIAGNOSIS — O4690 Antepartum hemorrhage, unspecified, unspecified trimester: Secondary | ICD-10-CM

## 2017-03-08 LAB — WET PREP, GENITAL
CLUE CELLS WET PREP: NONE SEEN
Sperm: NONE SEEN
TRICH WET PREP: NONE SEEN
WBC, Wet Prep HPF POC: NONE SEEN
YEAST WET PREP: NONE SEEN

## 2017-03-08 LAB — URINALYSIS, ROUTINE W REFLEX MICROSCOPIC
BILIRUBIN URINE: NEGATIVE
Glucose, UA: NEGATIVE mg/dL
KETONES UR: NEGATIVE mg/dL
LEUKOCYTES UA: NEGATIVE
Nitrite: NEGATIVE
PH: 6 (ref 5.0–8.0)
Protein, ur: NEGATIVE mg/dL
SPECIFIC GRAVITY, URINE: 1.008 (ref 1.005–1.030)

## 2017-03-08 NOTE — MAU Provider Note (Signed)
History    Patient Meghan Reeves is a 58 G1P0 At [redacted]w[redacted]d Here with complaints of vaginal bleeding since 10 am this morning. She denies any abdominal pain.  CSN: [id number]  Arrival date and time: 03/08/17 1052   First Provider Initiated Contact with Patient 03/08/17 1133      Chief Complaint  Patient presents with  . Vaginal Bleeding  . Abdominal Pain   Vaginal Bleeding  The patient's primary symptoms include vaginal bleeding. The patient's pertinent negatives include no genital itching, genital lesions, genital odor, genital rash, missed menses or pelvic pain. This is a new problem. The current episode started today. The problem occurs intermittently. The problem has been unchanged. Pertinent negatives include no abdominal pain, constipation, diarrhea, dysuria, fever, flank pain, urgency or vomiting. The vaginal discharge was bloody. The vaginal bleeding is lighter than menses. She has been passing clots (pea-sized clots). Nothing aggravates the symptoms. She has tried nothing for the symptoms.    OB History    Gravida Para Term Preterm AB Living   1             SAB TAB Ectopic Multiple Live Births                  Past Medical History:  Diagnosis Date  . Medical history non-contributory     Past Surgical History:  Procedure Laterality Date  . LAPAROSCOPIC APPENDECTOMY N/A 09/29/2016   Procedure: APPENDECTOMY LAPAROSCOPIC;  Surgeon: Greer Pickerel, MD;  Location: [facility];  Service: General;  Laterality: N/A;  . NO PAST SURGERIES      Family History  Problem Relation Age of Onset  . Asthma Mother     Social History  Substance Use Topics  . Smoking status: Current Every Day Smoker    Packs/day: 0.25    Years: 10.00    Types: Cigarettes  . Smokeless tobacco: Never Used  . Alcohol use No    Allergies: No Known Allergies  Prescriptions Prior to Admission  Medication Sig Dispense Refill Last Dose  . acetaminophen (TYLENOL) 325 MG tablet Take 325 mg by mouth every 6  (six) hours as needed for headache.   03/07/2017 at Unknown time  . Prenatal Vit-Fe Fumarate-FA (PRENATAL MULTIVITAMIN) TABS tablet Take 1 tablet by mouth daily at 12 noon.   03/08/2017 at Unknown time    Review of Systems  Constitutional: Negative.  Negative for fever.  HENT: Negative.   Respiratory: Negative.   Cardiovascular: Negative.   Gastrointestinal: Negative for abdominal pain, constipation, diarrhea and vomiting.  Genitourinary: Positive for vaginal bleeding. Negative for difficulty urinating, dyspareunia, dysuria, flank pain, missed menses, pelvic pain and urgency.  Musculoskeletal: Negative.   Neurological: Negative.   Psychiatric/Behavioral: Negative.    Physical Exam   Blood pressure 103/67, pulse 79, temperature 98 F (36.7 C), temperature source Oral, resp. rate 16, height 5' (1.524 m), weight 62.6 kg (138 lb), last menstrual period 11/23/2016, unknown if currently breastfeeding.  Physical Exam  Constitutional: She is oriented to person, place, and time. She appears well-developed and well-nourished.  HENT:  Head: Normocephalic.  Neck: Normal range of motion.  Respiratory: Effort normal.  GI: Soft. Bowel sounds are normal.  Genitourinary: Vagina normal.  Genitourinary Comments: NEFG; no lesions on vaginal walls or cervix. No CMT, no suprapubic or adnexal tenderness. Scant dark brown blood extruding from the os.   Musculoskeletal: Normal range of motion.  Neurological: She is alert and oriented to person, place, and time.  Skin: Skin is warm and  dry.  Psychiatric: She has a normal mood and affect.    MAU Course  Procedures  MDM -UA: normal -Doppler 163 -Wet prep: negative -GC CT pending Patient has no abdominal pain or cramping or increased bleeding since being in MAU.  Assessment and Plan   1. Vaginal bleeding during pregnancy, antepartum    2. Reviewed with patient that bleeding in early pregnancy is common; gave instructions to return to MAU if she  develops abdominal pain, increased bleeding (like a period) or any other concerning symtoms.  3. Patient stable for discharge. All questions answered.   Mervyn Skeeters Gustavia Carie CNM 03/08/2017, 12:06 PM

## 2017-03-08 NOTE — Progress Notes (Addendum)
G1 @ [redacted]wksga. Presents to triage for abdominal pain and bleeding that started at 1000. Spanish speaking. Interpreter needed and awaiting interpreter at this time.   1130: Provider at bs assessing pt. Provider able to talk in [place] to pt.  Intercourse 8 days ago.  Wetprep and GC and spec exam done.

## 2017-03-08 NOTE — Discharge Instructions (Signed)
Hematoma subcoriónico °(Subchorionic Hematoma) °Un hematoma subcoriónico es una acumulación de sangre entre la pared externa de la placenta y la pared interna del la matriz (útero). La placenta es el órgano que conecta el feto a la pared del útero. La placenta realiza la función de alimentación, respiración (oxígeno al feto) y el trabajo de eliminación de desechos (excreción) del feto. °Un hematoma subcoriónico es la anormalidad más frecuente encontrada en una ecografía durante el primer trimestre o principios del segundo trimestre del embarazo. Si ha habido poca o ninguna hemorragia vaginal, generalmente los pequeños hematomas se reducen por su propia cuenta y no afectan al bebé ni al embarazo. La sangre es absorbida gradualmente durante una o dos semanas. Cuando la hemorragia comienza más tarde en el embarazo o el hematoma es más grande o se produce en una paciente de edad avanzada, el resultado puede no ser tan bueno. Los grandes hematomas pueden agrandarse aún más y aumenta las posibilidades de aborto espontáneo. El hematoma subcoriónico también aumenta el riesgo de desprendimiento precoz de la placenta del útero, muerte fetal y parto prematuro. °INSTRUCCIONES PARA EL CUIDADO EN EL HOGAR °· Repose en cama si el médico se lo recomienda. Aunque el reposo en cama no evitará la hemorragia o un aborto espontáneo, su médico puede recomendarlo. °· Evite levantar objetos pesados (más de 10 libras [4,5 kg]), hacer ejercicio, tener relaciones sexuales o realizar duchas vaginales según se lo indique el profesional. °· Lleve un registro de la cantidad y grado de remojo (saturación) de las toallas higiénicas que utiliza cada día. Anote esta información. °· No use tampones. °· Cumpla con todas las visitas de control, según le indique su médico. El profesional podrá pedirle que se realice análisis de seguimiento, pruebas de ultrasonido o ambas. ° °SOLICITE ATENCIÓN MÉDICA DE INMEDIATO SI: °· Siente calambres intensos en el  estómago, en la espalda, en el abdomen o en la pelvis. °· Tiene fiebre. °· Elimina coágulos o tejidos grandes. Guarde los tejidos para que su médico los vea. °· Si la hemorragia aumenta o siente mareos, debilidad o tiene episodios de desmayos. ° °Esta información no tiene como fin reemplazar el consejo del médico. Asegúrese de hacerle al médico cualquier pregunta que tenga. °Document Released: 02/11/2009 Document Revised: 08/16/2013 Document Reviewed: 05/25/2013 °Elsevier Interactive Patient Education © 2017 Elsevier Inc. ° °

## 2017-03-08 NOTE — MAU Note (Signed)
Pt C/O bleeding that started @ 1000 this a.m., bleeding like a period.  Has mild lower abd pain.

## 2017-03-09 LAB — GC/CHLAMYDIA PROBE AMP (~~LOC~~) NOT AT ARMC
CHLAMYDIA, DNA PROBE: NEGATIVE
NEISSERIA GONORRHEA: NEGATIVE

## 2017-08-05 LAB — OB RESULTS CONSOLE GC/CHLAMYDIA
CHLAMYDIA, DNA PROBE: NEGATIVE
GC PROBE AMP, GENITAL: NEGATIVE

## 2017-08-05 LAB — OB RESULTS CONSOLE GBS: STREP GROUP B AG: NEGATIVE

## 2017-08-20 ENCOUNTER — Inpatient Hospital Stay (HOSPITAL_COMMUNITY)
Admission: AD | Admit: 2017-08-20 | Discharge: 2017-08-20 | Disposition: A | Discharge status: Home / Self Care | Payer: Self-pay | Source: Ambulatory Visit | Attending: Obstetrics and Gynecology | Admitting: Obstetrics and Gynecology

## 2017-08-20 ENCOUNTER — Encounter (HOSPITAL_COMMUNITY): Payer: Self-pay | Admitting: *Deleted

## 2017-08-20 DIAGNOSIS — O471 False labor at or after 37 completed weeks of gestation: Secondary | ICD-10-CM | POA: Insufficient documentation

## 2017-08-20 DIAGNOSIS — Z3A38 38 weeks gestation of pregnancy: Secondary | ICD-10-CM | POA: Insufficient documentation

## 2017-08-20 DIAGNOSIS — O479 False labor, unspecified: Secondary | ICD-10-CM

## 2017-08-20 NOTE — MAU Note (Addendum)
Having a lot of pain with ctxs and pelvic pressure since 1500. Stopped and started again at 1700. Denies LOF or bleeding. Last SVE cervix was closed

## 2017-08-31 ENCOUNTER — Telehealth (HOSPITAL_COMMUNITY): Payer: Self-pay | Admitting: *Deleted

## 2017-08-31 NOTE — Telephone Encounter (Signed)
Preadmission screen Interpreter number [phone]

## 2017-09-03 ENCOUNTER — Other Ambulatory Visit: Payer: Self-pay | Admitting: Obstetrics and Gynecology

## 2017-09-06 ENCOUNTER — Inpatient Hospital Stay (HOSPITAL_COMMUNITY): Payer: Medicaid Other | Admitting: Anesthesiology

## 2017-09-06 ENCOUNTER — Inpatient Hospital Stay (HOSPITAL_COMMUNITY)
Admission: RE | Admit: 2017-09-06 | Discharge: 2017-09-09 | DRG: 788 | Disposition: A | Discharge status: Home / Self Care | Payer: Medicaid Other | Source: Ambulatory Visit | Attending: Obstetrics & Gynecology | Admitting: Obstetrics & Gynecology

## 2017-09-06 ENCOUNTER — Encounter (HOSPITAL_COMMUNITY): Payer: Self-pay

## 2017-09-06 VITALS — BP 119/69 | HR 75 | Temp 98.5°F | Resp 20 | Ht 60.5 in | Wt 184.0 lb

## 2017-09-06 DIAGNOSIS — Z3A41 41 weeks gestation of pregnancy: Secondary | ICD-10-CM

## 2017-09-06 DIAGNOSIS — O9952 Diseases of the respiratory system complicating childbirth: Secondary | ICD-10-CM | POA: Diagnosis present

## 2017-09-06 DIAGNOSIS — O480 Post-term pregnancy: Principal | ICD-10-CM | POA: Diagnosis present

## 2017-09-06 DIAGNOSIS — J45909 Unspecified asthma, uncomplicated: Secondary | ICD-10-CM | POA: Diagnosis present

## 2017-09-06 DIAGNOSIS — Z3A40 40 weeks gestation of pregnancy: Secondary | ICD-10-CM | POA: Diagnosis not present

## 2017-09-06 DIAGNOSIS — Z87891 Personal history of nicotine dependence: Secondary | ICD-10-CM

## 2017-09-06 LAB — CBC
HCT: 34.4 % — ABNORMAL LOW (ref 36.0–46.0)
HEMOGLOBIN: 12.4 g/dL (ref 12.0–15.0)
MCH: 34.4 pg — ABNORMAL HIGH (ref 26.0–34.0)
MCHC: 36 g/dL (ref 30.0–36.0)
MCV: 95.6 fL (ref 78.0–100.0)
Platelets: 148 10*3/uL — ABNORMAL LOW (ref 150–400)
RBC: 3.6 MIL/uL — ABNORMAL LOW (ref 3.87–5.11)
RDW: 13.3 % (ref 11.5–15.5)
WBC: 10.5 10*3/uL (ref 4.0–10.5)

## 2017-09-06 LAB — TYPE AND SCREEN
ABO/RH(D): A POS
Antibody Screen: NEGATIVE

## 2017-09-06 LAB — RPR: RPR: NONREACTIVE

## 2017-09-06 MED ORDER — LACTATED RINGERS IV SOLN
500.0000 mL | INTRAVENOUS | Status: DC | PRN
  Administered 2017-09-06 – 2017-09-07 (×2): 500 mL via INTRAVENOUS

## 2017-09-06 MED ORDER — LIDOCAINE HCL (PF) 1 % IJ SOLN
INTRAMUSCULAR | Status: DC | PRN
  Administered 2017-09-06: 13 mL via EPIDURAL

## 2017-09-06 MED ORDER — ONDANSETRON HCL 4 MG/2ML IJ SOLN: 4.0000 mg | Freq: Four times a day (QID) | INTRAMUSCULAR | Status: DC | PRN

## 2017-09-06 MED ORDER — ACETAMINOPHEN 325 MG PO TABS: 650.0000 mg | ORAL_TABLET | ORAL | Status: DC | PRN

## 2017-09-06 MED ORDER — LIDOCAINE HCL (PF) 1 % IJ SOLN: 30.0000 mL | INTRAMUSCULAR | Status: DC | PRN

## 2017-09-06 MED ORDER — OXYTOCIN 40 UNITS IN LACTATED RINGERS INFUSION - SIMPLE MED
2.5000 [IU]/h | INTRAVENOUS | Status: DC
  Filled 2017-09-06: qty 1000

## 2017-09-06 MED ORDER — PHENYLEPHRINE 40 MCG/ML (10ML) SYRINGE FOR IV PUSH (FOR BLOOD PRESSURE SUPPORT): 80.0000 ug | PREFILLED_SYRINGE | INTRAVENOUS | Status: DC | PRN

## 2017-09-06 MED ORDER — FLEET ENEMA 7-19 GM/118ML RE ENEM: 1.0000 | ENEMA | RECTAL | Status: DC | PRN

## 2017-09-06 MED ORDER — PHENYLEPHRINE 40 MCG/ML (10ML) SYRINGE FOR IV PUSH (FOR BLOOD PRESSURE SUPPORT)
80.0000 ug | PREFILLED_SYRINGE | INTRAVENOUS | Status: DC | PRN
  Filled 2017-09-06: qty 10

## 2017-09-06 MED ORDER — EPHEDRINE 5 MG/ML INJ: 10.0000 mg | INTRAVENOUS | Status: DC | PRN

## 2017-09-06 MED ORDER — OXYCODONE-ACETAMINOPHEN 5-325 MG PO TABS: 2.0000 | ORAL_TABLET | ORAL | Status: DC | PRN

## 2017-09-06 MED ORDER — FENTANYL CITRATE (PF) 100 MCG/2ML IJ SOLN
100.0000 ug | INTRAMUSCULAR | Status: DC | PRN
  Administered 2017-09-06 (×2): 100 ug via INTRAVENOUS
  Filled 2017-09-06 (×3): qty 2

## 2017-09-06 MED ORDER — LACTATED RINGERS IV SOLN
500.0000 mL | Freq: Once | INTRAVENOUS | Status: AC
  Administered 2017-09-06: 500 mL via INTRAVENOUS

## 2017-09-06 MED ORDER — OXYTOCIN BOLUS FROM INFUSION: 500.0000 mL | Freq: Once | INTRAVENOUS | Status: DC

## 2017-09-06 MED ORDER — OXYCODONE-ACETAMINOPHEN 5-325 MG PO TABS: 1.0000 | ORAL_TABLET | ORAL | Status: DC | PRN

## 2017-09-06 MED ORDER — TERBUTALINE SULFATE 1 MG/ML IJ SOLN: 0.2500 mg | Freq: Once | INTRAMUSCULAR | Status: DC | PRN

## 2017-09-06 MED ORDER — FENTANYL 2.5 MCG/ML BUPIVACAINE 1/10 % EPIDURAL INFUSION (WH - ANES)
14.0000 mL/h | INTRAMUSCULAR | Status: DC | PRN
  Administered 2017-09-06 – 2017-09-07 (×3): 14 mL/h via EPIDURAL
  Filled 2017-09-06 (×3): qty 100

## 2017-09-06 MED ORDER — LACTATED RINGERS IV SOLN
INTRAVENOUS | Status: DC
  Administered 2017-09-06 – 2017-09-07 (×5): via INTRAVENOUS

## 2017-09-06 MED ORDER — SOD CITRATE-CITRIC ACID 500-334 MG/5ML PO SOLN
30.0000 mL | ORAL | Status: DC | PRN
  Administered 2017-09-06 – 2017-09-07 (×2): 30 mL via ORAL
  Filled 2017-09-06 (×2): qty 15

## 2017-09-06 MED ORDER — DIPHENHYDRAMINE HCL 50 MG/ML IJ SOLN: 12.5000 mg | INTRAMUSCULAR | Status: DC | PRN

## 2017-09-06 MED ORDER — MISOPROSTOL 50MCG HALF TABLET
50.0000 ug | ORAL_TABLET | ORAL | Status: DC | PRN
  Administered 2017-09-06 (×2): 50 ug via BUCCAL
  Filled 2017-09-06 (×2): qty 1

## 2017-09-06 NOTE — Anesthesia Pain Management Evaluation Note (Signed)
  CRNA Pain Management Visit Note  Patient: Meghan Reeves, 36 y.o., female  "Hello I am a member of the anesthesia team at [facility]. We have an anesthesia team available at all times to provide care throughout the hospital, including epidural management and anesthesia for C-section. I don't know your plan for the delivery whether it a natural birth, water birth, IV sedation, nitrous supplementation, doula or epidural, but we want to meet your pain goals."   1.Was your pain managed to your expectations on prior hospitalizations?   Yes   2.What is your expectation for pain management during this hospitalization?     Epidural  3.How can we help you reach that goal?   Record the patient's initial score and the patient's pain goal.   Pain: 8  Pain Goal: 3 [facility] wants you to be able to say your pain was always managed very well.  Jabier Mutton 09/06/2017

## 2017-09-06 NOTE — Anesthesia Preprocedure Evaluation (Signed)
Anesthesia Evaluation  Patient identified by MRN, date of birth, ID band Patient awake    Reviewed: Allergy & Precautions, NPO status , Patient's Chart, lab work & pertinent test results  Airway Mallampati: II  TM Distance: >3 FB Neck ROM: Full    Dental no notable dental hx.    Pulmonary Current Smoker,    Pulmonary exam normal breath sounds clear to auscultation       Cardiovascular negative cardio ROS Normal cardiovascular exam Rhythm:Regular Rate:Normal     Neuro/Psych negative neurological ROS  negative psych ROS   GI/Hepatic negative GI ROS, Neg liver ROS,   Endo/Other  negative endocrine ROS  Renal/GU negative Renal ROS  negative genitourinary   Musculoskeletal negative musculoskeletal ROS (+)   Abdominal   Peds negative pediatric ROS (+)  Hematology negative hematology ROS (+)   Anesthesia Other Findings   Reproductive/Obstetrics negative OB ROS (+) Pregnancy                             Anesthesia Physical  Anesthesia Plan  ASA: II  Anesthesia Plan: Epidural   Post-op Pain Management:    Induction:   PONV Risk Score and Plan:   Airway Management Planned:   Additional Equipment:   Intra-op Plan:   Post-operative Plan:   Informed Consent: I have reviewed the patients History and Physical, chart, labs and discussed the procedure including the risks, benefits and alternatives for the proposed anesthesia with the patient or authorized representative who has indicated his/her understanding and acceptance.     Plan Discussed with:   Anesthesia Plan Comments:         Anesthesia Quick Evaluation

## 2017-09-06 NOTE — Progress Notes (Signed)
Meghan Reeves is a 36 y.o. G2P0010 at [redacted]w[redacted]d by LMP admitted for induction of labor due to Post dates.  Subjective:   Objective: BP (!) 140/95   Pulse 91   Temp 98 F (36.7 C) (Oral)   Resp 17   Ht 5' 0.5" (1.537 m)   Wt 83.5 kg (184 lb)   LMP 11/23/2016 Comment: neg preg test today  BMI 35.34 kg/m  No intake/output data recorded. No intake/output data recorded.  FHT:  FHR: 145 bpm, variability: minimal ,  accelerations:  Present,  decelerations:  Absent UC:   regular, every 2-3 minutes SVE:   Dilation: 1 Effacement (%): 30 Station: -3 Exam by:: Mary Martinique Johnson, RN   Labs: Lab Results  Component Value Date   WBC 10.5 09/06/2017   HGB 12.4 09/06/2017   HCT 34.4 (L) 09/06/2017   MCV 95.6 09/06/2017   PLT 148 (L) 09/06/2017    Assessment / Plan: Induction of labor due to postterm,  progressing well on pitocin  Labor: Progressing normally and SROM (Clear Fluid @ 1800 per nursing) Preeclampsia:  no signs or symptoms of toxicity Fetal Wellbeing:  Category I Pain Control:  Patient is asking for pain assistance I/D:  GBS Negative Anticipated MOD:  NSVD  Meghan Reeves 09/06/2017, 6:38 PM

## 2017-09-06 NOTE — Progress Notes (Signed)
Meghan Reeves is a 36 y.o. G2P0010 at [redacted]w[redacted]d by LMP admitted for induction of labor due to Post dates. Due date 08/30/17.  Subjective: Interpreter used for this encounter (Spanish)  Patient doing well. Pain with contractions. Currently denying pain medication.   Objective: BP 139/83   Pulse 93   Temp 98 F (36.7 C) (Oral)   Resp 20   Ht 5' 0.5" (1.537 m)   Wt 83.5 kg (184 lb)   LMP 11/23/2016 Comment: neg preg test today  BMI 35.34 kg/m  No intake/output data recorded. No intake/output data recorded.  FHT:  FHR: 150 bpm, variability: minimal ,  accelerations:  Present,  decelerations:  Absent UC:   irregular, inderterminate SVE:   Dilation: Fingertip Effacement (%): Thick Station: -3 Exam by:: phelps Patient tender on cervical exam (reports pressure with pain). Foley placement not attempted due to tenderness.   Labs: Lab Results  Component Value Date   WBC 10.5 09/06/2017   HGB 12.4 09/06/2017   HCT 34.4 (L) 09/06/2017   MCV 95.6 09/06/2017   PLT 148 (L) 09/06/2017    Assessment / Plan: Induction of labor due to postterm,  progressing well on pitocin  Labor: Progressing normally Preeclampsia:  no signs or symptoms of toxicity Fetal Wellbeing:  Category I Pain Control:  Labor support without medications I/D:  GBS Neg Anticipated MOD:  NSVD  Angela Cox Tyra Gural 09/06/2017, 1:55 PM

## 2017-09-06 NOTE — Anesthesia Procedure Notes (Signed)
Epidural Patient location during procedure: OB Start time: 09/06/2017 9:38 PM End time: 09/06/2017 9:56 PM  Staffing Anesthesiologist: Candida Peeling RAY Performed: anesthesiologist   Preanesthetic Checklist Completed: patient identified, site marked, surgical consent, pre-op evaluation, timeout performed, IV checked, risks and benefits discussed and monitors and equipment checked  Epidural Patient position: sitting Prep: ChloraPrep Patient monitoring: heart rate, cardiac monitor, continuous pulse ox and blood pressure Approach: midline Location: L2-L3 Injection technique: LOR saline  Needle:  Needle type: Tuohy  Needle gauge: 17 G Needle length: 9 cm Needle insertion depth: 5 cm Catheter type: closed end flexible Catheter size: 20 Guage Catheter at skin depth: 9 cm Test dose: negative  Assessment Events: blood not aspirated, injection not painful, no injection resistance, negative IV test and no paresthesia  Additional Notes Reason for block:procedure for pain

## 2017-09-06 NOTE — Progress Notes (Signed)
Labor Progress Note Meghan Reeves is a 36 y.o. G2P0010 at [redacted]w[redacted]d presented for IOL 2/2 postdates S: Patient requests epidural   O:  BP (!) 153/88   Pulse 98   Temp 98 F (36.7 C)   Resp 19   Ht 5' 0.5" (1.537 m)   Wt 184 lb (83.5 kg)   LMP 11/23/2016 Comment: neg preg test today  BMI 35.34 kg/m  EFM: 150/mod var/pos acels/decels  CVE: Dilation: 2.5 Effacement (%): 50 Cervical Position: Posterior Station: -2 Presentation: Vertex Exam by:: Mary Martinique Johnson, RN    A&P: 36 y.o. G2P0010 [redacted]w[redacted]d here for IOL for postdates #Labor: little progression but foley bulb is out. Plan to place IUPC after epidural placement #Pain: getting epidural #FWB: cat 2   Dannielle Huh, DO 9:36 PM

## 2017-09-06 NOTE — H&P (Signed)
LABOR AND DELIVERY ADMISSION HISTORY AND PHYSICAL NOTE  Meghan Reeves is a 36 y.o. female G1P0000 with IUP at [redacted]w[redacted]d by LMP presenting for IOL due to post dates.  She reports positive fetal movement. She denies leakage of fluid or vaginal bleeding.  She denies fever, recent sickness, headache, changes in vision, abdominal pain, urinary pain or burning, no constipation or diarrhea, no rectal bleeding.  Prenatal History/Complications: PNC at HD Low-risk pregnancy Pregnancy complications:  - tobacco use disorder. Patient states she stopped smoking 3 months ago and plans to abstain from smoking after delivery.  Past Medical History: Past Medical History:  Diagnosis Date  . Medical history non-contributory   Asthma diagnosed at age of 84. Controlled without use of inhalers. Affects ADLs 2X per month. No nighttime awakenings.   Past Surgical History: Past Surgical History:  Procedure Laterality Date  . APPENDECTOMY     Nov 2017  . LAPAROSCOPIC APPENDECTOMY N/A 09/29/2016   Procedure: APPENDECTOMY LAPAROSCOPIC;  Surgeon: Greer Pickerel, MD;  Location: [facility];  Service: General;  Laterality: N/A;  . NO PAST SURGERIES      Obstetrical History: OB History    Gravida Para Term Preterm AB Living   1 0 0 0 0 0   SAB TAB Ectopic Multiple Live Births   0 0 0 0 0      Social History: Social History   Social History  . Marital status: Single    Spouse name: N/A  . Number of children: N/A  . Years of education: N/A   Social History Main Topics  . Smoking status: Current Every Day Smoker    Packs/day: 0.25    Years: 10.00    Types: Cigars  . Smokeless tobacco: Never Used  . Alcohol use No  . Drug use: No  . Sexual activity: Yes    Birth control/ protection: None   Other Topics Concern  . None   Social History Narrative  . None    Family History: Family History  Problem Relation Age of Onset  . Asthma Mother     Allergies: No Known Allergies  Prescriptions Prior to  Admission  Medication Sig Dispense Refill Last Dose  . acetaminophen (TYLENOL) 325 MG tablet Take 650 mg by mouth every 6 (six) hours as needed for headache.    Past Month at Unknown time  . calcium carbonate (TUMS - DOSED IN MG ELEMENTAL CALCIUM) 500 MG chewable tablet Chew 2 tablets by mouth daily.   Past Week at Unknown time  . loratadine (CLARITIN) 10 MG tablet Take 10 mg by mouth daily.   Past Month at Unknown time  . Prenatal Vit-Fe Fumarate-FA (PRENATAL MULTIVITAMIN) TABS tablet Take 1 tablet by mouth daily at 12 noon.   08/20/2017 at Unknown time     Review of Systems  All systems reviewed and negative except as stated in HPI  Physical Exam Blood pressure 128/87, pulse 97, temperature (!) 97.4 F (36.3 C), temperature source Oral, resp. rate 16, height 5' 0.5" (1.537 m), weight 83.5 kg (184 lb), last menstrual period 11/23/2016, unknown if currently breastfeeding. General appearance: alert, cooperative and no distress Lungs: clear to auscultation bilaterally Heart: regular rate and rhythm Abdomen: soft, non-tender; bowel sounds normal Extremities: No calf swelling or tenderness Presentation: cephalic Fetal monitoring: Category 1. Normal Baseline, minimal variability Uterine activity: Contractions (irregular).  Dilation: Fingertip Effacement (%): Thick Station: -3 Exam by:: Dalbert Mayotte RN  Prenatal labs: ABO, Rh: --/--/A POS (03/07 2155) Antibody: Negative (03/07 0000) Rubella:  Immune (03/07 0000) RPR: Non Reactive (03/07 2155)  HBsAg: Negative (03/07 0000)  HIV: Non-reactive (03/07 0000)  GC/Chlamydia: Negative (09/27 0000)  GBS: Negative (09/27 0000)  1 hr Glucola: abnormal 1hr at 151; 3hr GTT normal  Genetic screening:  Quad Screen; 03/25/17; Normal Anatomy US: 01/23/17. Normal  Prenatal Transfer Tool  Maternal Diabetes: No Genetic Screening: Normal Maternal Ultrasounds/Referrals: Normal Fetal Ultrasounds or other Referrals:  None Maternal Substance Abuse:  Yes:   Type: Smoker Significant Maternal Medications:  None Significant Maternal Lab Results: None  Results for orders placed or performed during the hospital encounter of 09/06/17 (from the past 24 hour(s))  CBC   Collection Time: 09/06/17  8:07 AM  Result Value Ref Range   WBC 10.5 4.0 - 10.5 K/uL   RBC 3.60 (L) 3.87 - 5.11 MIL/uL   Hemoglobin 12.4 12.0 - 15.0 g/dL   HCT 34.4 (L) 36.0 - 46.0 %   MCV 95.6 78.0 - 100.0 fL   MCH 34.4 (H) 26.0 - 34.0 pg   MCHC 36.0 30.0 - 36.0 g/dL   RDW 13.3 11.5 - 15.5 %   Platelets 148 (L) 150 - 400 K/uL    Patient Active Problem List   Diagnosis Date Noted  . Post-dates pregnancy 09/06/2017  . Spanish speaking patient 10/01/2016  . Appendicitis, acute, with peritonitis s/p lap appendectomy 09/29/2016 09/29/2016    Assessment: Meghan Reeves is a 36 y.o. G1P0000 at [redacted]w[redacted]d here for IOL due to post dates.  #Labor: Started on Cytotec, consider foley bulb as well for induction.  #Pain: Medications or epidural upon maternal request #FWB: Category 1 #Infectious Disease: GBS negative #MOF (Method of Feeding): breast #MOC (Method of Contraception): nexplanon #Circ (If Boy, Circumcision):  Boy/no  Luiz Blare, DO OB Fellow 09/06/2017, 12:21 PM

## 2017-09-07 ENCOUNTER — Encounter (HOSPITAL_COMMUNITY): Payer: Self-pay

## 2017-09-07 ENCOUNTER — Encounter (HOSPITAL_COMMUNITY)
Admission: RE | Disposition: A | Discharge status: Home / Self Care | Payer: Self-pay | Source: Ambulatory Visit | Attending: Obstetrics & Gynecology

## 2017-09-07 DIAGNOSIS — O480 Post-term pregnancy: Secondary | ICD-10-CM

## 2017-09-07 DIAGNOSIS — Z3A40 40 weeks gestation of pregnancy: Secondary | ICD-10-CM

## 2017-09-07 LAB — CREATININE, SERUM
Creatinine, Ser: 0.95 mg/dL (ref 0.44–1.00)
GFR calc Af Amer: >60 mL/min (ref >60)
GFR calc non Af Amer: >60 mL/min (ref >60)

## 2017-09-07 LAB — CBC
HCT: 31.1 % — ABNORMAL LOW (ref 36.0–46.0)
HEMOGLOBIN: 10.8 g/dL — AB (ref 12.0–15.0)
MCH: 33.8 pg (ref 26.0–34.0)
MCHC: 34.7 g/dL (ref 30.0–36.0)
MCV: 97.2 fL (ref 78.0–100.0)
PLATELETS: 132 10*3/uL — AB (ref 150–400)
RBC: 3.2 MIL/uL — AB (ref 3.87–5.11)
RDW: 13.6 % (ref 11.5–15.5)
WBC: 22.3 10*3/uL — ABNORMAL HIGH (ref 4.0–10.5)

## 2017-09-07 SURGERY — Surgical Case
Anesthesia: Epidural

## 2017-09-07 MED ORDER — SIMETHICONE 80 MG PO CHEW
80.0000 mg | CHEWABLE_TABLET | ORAL | Status: DC
  Administered 2017-09-08 (×2): 80 mg via ORAL
  Filled 2017-09-07 (×2): qty 1

## 2017-09-07 MED ORDER — ACETAMINOPHEN 325 MG PO TABS
650.0000 mg | ORAL_TABLET | ORAL | Status: DC | PRN
  Administered 2017-09-08 (×2): 650 mg via ORAL
  Filled 2017-09-07 (×2): qty 2

## 2017-09-07 MED ORDER — NALOXONE HCL 2 MG/2ML IJ SOSY: 1.0000 ug/kg/h | PREFILLED_SYRINGE | INTRAMUSCULAR | Status: DC | PRN

## 2017-09-07 MED ORDER — KETOROLAC TROMETHAMINE 30 MG/ML IJ SOLN: 30.0000 mg | Freq: Four times a day (QID) | INTRAMUSCULAR | Status: AC | PRN

## 2017-09-07 MED ORDER — LACTATED RINGERS IV SOLN
INTRAVENOUS | Status: DC | PRN
  Administered 2017-09-07 (×2): via INTRAVENOUS

## 2017-09-07 MED ORDER — OXYTOCIN 10 UNIT/ML IJ SOLN
INTRAVENOUS | Status: DC | PRN
  Administered 2017-09-07: 40 [IU] via INTRAVENOUS

## 2017-09-07 MED ORDER — MEPERIDINE HCL 25 MG/ML IJ SOLN: 6.2500 mg | INTRAMUSCULAR | Status: DC | PRN

## 2017-09-07 MED ORDER — MEPERIDINE HCL 25 MG/ML IJ SOLN
INTRAMUSCULAR | Status: AC
Start: 2017-09-07 — End: 2017-09-07
  Filled 2017-09-07: qty 1

## 2017-09-07 MED ORDER — SIMETHICONE 80 MG PO CHEW: 80.0000 mg | CHEWABLE_TABLET | ORAL | Status: DC | PRN

## 2017-09-07 MED ORDER — ONDANSETRON HCL 4 MG/2ML IJ SOLN: 4.0000 mg | Freq: Once | INTRAMUSCULAR | Status: DC | PRN

## 2017-09-07 MED ORDER — MEPERIDINE HCL 25 MG/ML IJ SOLN
INTRAMUSCULAR | Status: DC | PRN
  Administered 2017-09-07 (×2): 12.5 mg via INTRAVENOUS

## 2017-09-07 MED ORDER — SODIUM BICARBONATE 8.4 % IV SOLN
INTRAVENOUS | Status: DC | PRN
  Administered 2017-09-07: 4 mL via EPIDURAL

## 2017-09-07 MED ORDER — PHENYLEPHRINE HCL 10 MG/ML IJ SOLN
INTRAMUSCULAR | Status: DC | PRN
  Administered 2017-09-07: 80 ug via INTRAVENOUS
  Administered 2017-09-07 (×2): 40 ug via INTRAVENOUS

## 2017-09-07 MED ORDER — DIBUCAINE 1 % RE OINT: 1.0000 "application " | TOPICAL_OINTMENT | RECTAL | Status: DC | PRN

## 2017-09-07 MED ORDER — LACTATED RINGERS IV SOLN: INTRAVENOUS | Status: DC

## 2017-09-07 MED ORDER — WITCH HAZEL-GLYCERIN EX PADS: 1.0000 "application " | MEDICATED_PAD | CUTANEOUS | Status: DC | PRN

## 2017-09-07 MED ORDER — SODIUM CHLORIDE 0.9% FLUSH
3.0000 mL | INTRAVENOUS | Status: DC | PRN
Start: 2017-09-07 — End: 2017-09-09

## 2017-09-07 MED ORDER — OXYCODONE HCL 5 MG PO TABS
5.0000 mg | ORAL_TABLET | ORAL | Status: DC | PRN
  Administered 2017-09-09 (×2): 5 mg via ORAL
  Filled 2017-09-07 (×2): qty 1

## 2017-09-07 MED ORDER — ENOXAPARIN SODIUM 40 MG/0.4ML ~~LOC~~ SOLN
40.0000 mg | SUBCUTANEOUS | Status: DC
  Filled 2017-09-07: qty 0.4

## 2017-09-07 MED ORDER — IBUPROFEN 600 MG PO TABS
600.0000 mg | ORAL_TABLET | Freq: Four times a day (QID) | ORAL | Status: DC
  Administered 2017-09-08 – 2017-09-09 (×5): 600 mg via ORAL
  Filled 2017-09-07 (×6): qty 1

## 2017-09-07 MED ORDER — ONDANSETRON HCL 4 MG/2ML IJ SOLN
INTRAMUSCULAR | Status: AC
  Filled 2017-09-07: qty 2

## 2017-09-07 MED ORDER — MORPHINE SULFATE (PF) 0.5 MG/ML IJ SOLN
INTRAMUSCULAR | Status: DC | PRN
  Administered 2017-09-07: 4 mg via EPIDURAL
  Administered 2017-09-07: 1 mg via INTRAVENOUS

## 2017-09-07 MED ORDER — TERBUTALINE SULFATE 1 MG/ML IJ SOLN: 0.2500 mg | Freq: Once | INTRAMUSCULAR | Status: DC | PRN

## 2017-09-07 MED ORDER — NALBUPHINE HCL 10 MG/ML IJ SOLN: 5.0000 mg | INTRAMUSCULAR | Status: DC | PRN

## 2017-09-07 MED ORDER — DEXAMETHASONE SODIUM PHOSPHATE 4 MG/ML IJ SOLN
INTRAMUSCULAR | Status: DC | PRN
  Administered 2017-09-07: 4 mg via INTRAVENOUS

## 2017-09-07 MED ORDER — COCONUT OIL OIL: 1.0000 "application " | TOPICAL_OIL | Status: DC | PRN

## 2017-09-07 MED ORDER — DIPHENHYDRAMINE HCL 25 MG PO CAPS: 25.0000 mg | ORAL_CAPSULE | ORAL | Status: DC | PRN

## 2017-09-07 MED ORDER — LIDOCAINE-EPINEPHRINE (PF) 2 %-1:200000 IJ SOLN
INTRAMUSCULAR | Status: DC | PRN
  Administered 2017-09-07: 10 mL

## 2017-09-07 MED ORDER — SCOPOLAMINE 1 MG/3DAYS TD PT72
MEDICATED_PATCH | TRANSDERMAL | Status: DC | PRN
  Administered 2017-09-07: 1 via TRANSDERMAL

## 2017-09-07 MED ORDER — DIPHENHYDRAMINE HCL 25 MG PO CAPS: 25.0000 mg | ORAL_CAPSULE | Freq: Four times a day (QID) | ORAL | Status: DC | PRN

## 2017-09-07 MED ORDER — DEXAMETHASONE SODIUM PHOSPHATE 10 MG/ML IJ SOLN
INTRAMUSCULAR | Status: AC
  Filled 2017-09-07: qty 1

## 2017-09-07 MED ORDER — MENTHOL 3 MG MT LOZG: 1.0000 | LOZENGE | OROMUCOSAL | Status: DC | PRN

## 2017-09-07 MED ORDER — SCOPOLAMINE 1 MG/3DAYS TD PT72: 1.0000 | MEDICATED_PATCH | Freq: Once | TRANSDERMAL | Status: DC

## 2017-09-07 MED ORDER — OXYTOCIN 10 UNIT/ML IJ SOLN
INTRAMUSCULAR | Status: AC
  Filled 2017-09-07: qty 4

## 2017-09-07 MED ORDER — CEFOTETAN DISODIUM-DEXTROSE 2-2.08 GM-%(50ML) IV SOLR
2.0000 g | INTRAVENOUS | Status: AC
  Administered 2017-09-07: 2 g via INTRAVENOUS
  Filled 2017-09-07: qty 50

## 2017-09-07 MED ORDER — FENTANYL CITRATE (PF) 100 MCG/2ML IJ SOLN: 25.0000 ug | INTRAMUSCULAR | Status: DC | PRN

## 2017-09-07 MED ORDER — SIMETHICONE 80 MG PO CHEW
80.0000 mg | CHEWABLE_TABLET | Freq: Three times a day (TID) | ORAL | Status: DC
  Administered 2017-09-08 – 2017-09-09 (×4): 80 mg via ORAL
  Filled 2017-09-07 (×3): qty 1

## 2017-09-07 MED ORDER — TETANUS-DIPHTH-ACELL PERTUSSIS 5-2.5-18.5 LF-MCG/0.5 IM SUSP: 0.5000 mL | Freq: Once | INTRAMUSCULAR | Status: DC

## 2017-09-07 MED ORDER — SODIUM BICARBONATE 8.4 % IV SOLN
INTRAVENOUS | Status: AC
  Filled 2017-09-07: qty 50

## 2017-09-07 MED ORDER — NALOXONE HCL 0.4 MG/ML IJ SOLN: 0.4000 mg | INTRAMUSCULAR | Status: DC | PRN

## 2017-09-07 MED ORDER — NALBUPHINE HCL 10 MG/ML IJ SOLN: 5.0000 mg | Freq: Once | INTRAMUSCULAR | Status: DC | PRN

## 2017-09-07 MED ORDER — OXYCODONE HCL 5 MG PO TABS: 10.0000 mg | ORAL_TABLET | ORAL | Status: DC | PRN

## 2017-09-07 MED ORDER — SENNOSIDES-DOCUSATE SODIUM 8.6-50 MG PO TABS
2.0000 | ORAL_TABLET | ORAL | Status: DC
  Administered 2017-09-08 (×2): 2 via ORAL
  Filled 2017-09-07 (×2): qty 2

## 2017-09-07 MED ORDER — PHENYLEPHRINE 40 MCG/ML (10ML) SYRINGE FOR IV PUSH (FOR BLOOD PRESSURE SUPPORT)
PREFILLED_SYRINGE | INTRAVENOUS | Status: AC
  Filled 2017-09-07: qty 10

## 2017-09-07 MED ORDER — ONDANSETRON HCL 4 MG/2ML IJ SOLN
INTRAMUSCULAR | Status: DC | PRN
  Administered 2017-09-07: 4 mg via INTRAVENOUS

## 2017-09-07 MED ORDER — KETOROLAC TROMETHAMINE 30 MG/ML IJ SOLN
INTRAMUSCULAR | Status: AC
  Filled 2017-09-07: qty 1

## 2017-09-07 MED ORDER — PRENATAL MULTIVITAMIN CH
1.0000 | ORAL_TABLET | Freq: Every day | ORAL | Status: DC
  Administered 2017-09-08: 1 via ORAL
  Filled 2017-09-07: qty 1

## 2017-09-07 MED ORDER — LACTATED RINGERS IV SOLN
INTRAVENOUS | Status: DC | PRN
  Administered 2017-09-07: 19:00:00 via INTRAVENOUS

## 2017-09-07 MED ORDER — ONDANSETRON HCL 4 MG/2ML IJ SOLN: 4.0000 mg | Freq: Three times a day (TID) | INTRAMUSCULAR | Status: DC | PRN

## 2017-09-07 MED ORDER — DIPHENHYDRAMINE HCL 50 MG/ML IJ SOLN: 12.5000 mg | INTRAMUSCULAR | Status: DC | PRN

## 2017-09-07 MED ORDER — OXYTOCIN 40 UNITS IN LACTATED RINGERS INFUSION - SIMPLE MED
1.0000 m[IU]/min | INTRAVENOUS | Status: DC
  Administered 2017-09-07: 2 m[IU]/min via INTRAVENOUS
  Administered 2017-09-07: 4 m[IU]/min via INTRAVENOUS

## 2017-09-07 MED ORDER — OXYTOCIN 40 UNITS IN LACTATED RINGERS INFUSION - SIMPLE MED: 2.5000 [IU]/h | INTRAVENOUS | Status: AC

## 2017-09-07 MED ORDER — LIDOCAINE-EPINEPHRINE (PF) 2 %-1:200000 IJ SOLN
INTRAMUSCULAR | Status: AC
  Filled 2017-09-07: qty 20

## 2017-09-07 MED ORDER — MORPHINE SULFATE (PF) 0.5 MG/ML IJ SOLN
INTRAMUSCULAR | Status: AC
  Filled 2017-09-07: qty 10

## 2017-09-07 MED ORDER — KETOROLAC TROMETHAMINE 30 MG/ML IJ SOLN
30.0000 mg | Freq: Four times a day (QID) | INTRAMUSCULAR | Status: AC | PRN
Start: 2017-09-07 — End: 2017-09-08
  Administered 2017-09-07: 30 mg via INTRAMUSCULAR

## 2017-09-07 MED ORDER — SCOPOLAMINE 1 MG/3DAYS TD PT72
MEDICATED_PATCH | TRANSDERMAL | Status: AC
  Filled 2017-09-07: qty 1

## 2017-09-07 MED ORDER — METOCLOPRAMIDE HCL 5 MG/ML IJ SOLN
INTRAMUSCULAR | Status: DC | PRN
  Administered 2017-09-07: 10 mg via INTRAVENOUS

## 2017-09-07 SURGICAL SUPPLY — 32 items
CHLORAPREP W/TINT 26ML (MISCELLANEOUS) ×3 IMPLANT
CLAMP CORD UMBIL (MISCELLANEOUS) IMPLANT
CONTAINER PREFILL 10% NBF 15ML (MISCELLANEOUS) IMPLANT
DRSG OPSITE POSTOP 4X10 (GAUZE/BANDAGES/DRESSINGS) ×3 IMPLANT
ELECT REM PT RETURN 9FT ADLT (ELECTROSURGICAL) ×3
ELECTRODE REM PT RTRN 9FT ADLT (ELECTROSURGICAL) ×1 IMPLANT
EXTRACTOR VACUUM M CUP 4 TUBE (SUCTIONS) IMPLANT
EXTRACTOR VACUUM M CUP 4' TUBE (SUCTIONS)
GAUZE SPONGE 4X4 12PLY STRL LF (GAUZE/BANDAGES/DRESSINGS) ×3 IMPLANT
GLOVE BIOGEL PI IND STRL 6.5 (GLOVE) ×1 IMPLANT
GLOVE BIOGEL PI IND STRL 7.0 (GLOVE) ×1 IMPLANT
GLOVE BIOGEL PI INDICATOR 6.5 (GLOVE) ×2
GLOVE BIOGEL PI INDICATOR 7.0 (GLOVE) ×2
GLOVE SURG SS PI 6.0 STRL IVOR (GLOVE) ×3 IMPLANT
GOWN STRL REUS W/TWL LRG LVL3 (GOWN DISPOSABLE) ×6 IMPLANT
KIT ABG SYR 3ML LUER SLIP (SYRINGE) IMPLANT
NEEDLE HYPO 25X5/8 SAFETYGLIDE (NEEDLE) IMPLANT
NS IRRIG 1000ML POUR BTL (IV SOLUTION) ×3 IMPLANT
PACK C SECTION WH (CUSTOM PROCEDURE TRAY) ×3 IMPLANT
PAD ABD 7.5X8 STRL (GAUZE/BANDAGES/DRESSINGS) ×3 IMPLANT
PAD OB MATERNITY 4.3X12.25 (PERSONAL CARE ITEMS) ×3 IMPLANT
PENCIL SMOKE EVAC W/HOLSTER (ELECTROSURGICAL) ×3 IMPLANT
RTRCTR C-SECT PINK 25CM LRG (MISCELLANEOUS) IMPLANT
SEPRAFILM MEMBRANE 5X6 (MISCELLANEOUS) IMPLANT
SPONGE LAP 18X18 X RAY DECT (DISPOSABLE) ×3 IMPLANT
SUT PLAIN 0 NONE (SUTURE) IMPLANT
SUT PLAIN 2 0 (SUTURE) ×2
SUT PLAIN ABS 2-0 CT1 27XMFL (SUTURE) ×1 IMPLANT
SUT VIC AB 0 CT1 36 (SUTURE) ×15 IMPLANT
SUT VIC AB 4-0 KS 27 (SUTURE) ×3 IMPLANT
TOWEL OR 17X24 6PK STRL BLUE (TOWEL DISPOSABLE) ×3 IMPLANT
TRAY FOLEY BAG SILVER LF 14FR (SET/KITS/TRAYS/PACK) ×3 IMPLANT

## 2017-09-07 NOTE — Anesthesia Postprocedure Evaluation (Signed)
Anesthesia Post Note  Patient: Meghan Reeves  Procedure(s) Performed: CESAREAN SECTION (N/A )     Patient location during evaluation: Mother Baby Anesthesia Type: Epidural Level of consciousness: awake and alert Pain management: pain level controlled Vital Signs Assessment: post-procedure vital signs reviewed and stable Respiratory status: spontaneous breathing, nonlabored ventilation and respiratory function stable Cardiovascular status: stable Postop Assessment: no headache, no backache and epidural receding Anesthetic complications: no    Last Vitals:  Vitals:   09/07/17 2127 09/07/17 2232  BP: 111/72 108/87  Pulse: 98 78  Resp: 14 16  Temp: 37.5 C 37 C  SpO2: 93% 92%    Last Pain:  Vitals:   09/07/17 2232  TempSrc: Oral  PainSc: 3    Pain Goal:                 Meghan Reeves

## 2017-09-07 NOTE — Progress Notes (Signed)
Patient ID: Meghan Reeves, female   DOB: 1981-06-13, 36 y.o.   MRN: [id number] Called to evaluate patient without cervical change over at least the past 3 hours with adequate contractions. Discussed with patient need for delivery via cesarean section due to failure to progress. Risks, benefits and alternatives were explained including but not limited to risks of bleeding, infection and damage to adjacent organs. Patient verbalized understanding and all questions were answered.

## 2017-09-07 NOTE — Op Note (Signed)
Meghan Reeves PROCEDURE DATE: 09/06/2017 - 09/07/2017  PREOPERATIVE DIAGNOSIS: Intrauterine pregnancy at  [redacted]w[redacted]d weeks gestation; failure to progress: arrest of dilation  POSTOPERATIVE DIAGNOSIS: The same  PROCEDURE:     Cesarean Section  SURGEON:  Dr. Mora Bellman  ASSISTANT: none  INDICATIONS: Meghan Reeves is a 36 y.o. G2P0010 at [redacted]w[redacted]d scheduled for cesarean section secondary to failure to progress: arrest of dilation.  The risks of cesarean section discussed with the patient included but were not limited to: bleeding which may require transfusion or reoperation; infection which may require antibiotics; injury to bowel, bladder, ureters or other surrounding organs; injury to the fetus; need for additional procedures including hysterectomy in the event of a life-threatening hemorrhage; placental abnormalities wth subsequent pregnancies, incisional problems, thromboembolic phenomenon and other postoperative/anesthesia complications. The patient concurred with the proposed plan, giving informed written consent for the procedure.    FINDINGS:  Viable female infant in cephalic presentation.  Apgars 9 and 9.  Clear amniotic fluid.  Intact placenta, three vessel cord.  Normal uterus, fallopian tubes and ovaries bilaterally.  ANESTHESIA:    Spinal INTRAVENOUS FLUIDS:2600 ml ESTIMATED BLOOD LOSS: 1190 mL ml URINE OUTPUT:  150 ml SPECIMENS: Placenta sent to L&D COMPLICATIONS: None immediate  PROCEDURE IN DETAIL:  The patient received intravenous antibiotics and had sequential compression devices applied to her lower extremities while in the preoperative area.  She was then taken to the operating room where anesthesia was induced and was found to be adequate. A foley catheter was placed into her bladder and attached to Meghan Reeves gravity. She was then placed in a dorsal supine position with a leftward tilt, and prepped and draped in a sterile manner. After an adequate timeout was performed, a  Pfannenstiel skin incision was made with scalpel and carried through to the underlying layer of fascia. The fascia was incised in the midline and this incision was extended bilaterally using the Mayo scissors. Kocher clamps were applied to the superior aspect of the fascial incision and the underlying rectus muscles were dissected off bluntly. A similar process was carried out on the inferior aspect of the facial incision. The rectus muscles were separated in the midline bluntly and the peritoneum was entered bluntly. The Alexis self-retaining retractor was introduced into the abdominal cavity. Attention was turned to the lower uterine segment where a bladder flap was created, and a transverse hysterotomy was made with a scalpel and extended bilaterally bluntly. The infant was successfully delivered, and delayed cord clamping performed for 1 minute. The cord was clamped and cut and infant was handed over to awaiting neonatology team. Uterine massage was then administered and the placenta delivered intact with three-vessel cord. The uterus was cleared of clot and debris.  The hysterotomy was closed with 0 Vicryl in a running locked fashion, and an imbricating layer was also placed with a 0 Vicryl. Overall, excellent hemostasis was noted. The pelvis copiously irrigated and cleared of all clot and debris. Hemostasis was confirmed on all surfaces.  The peritoneum and the muscles were reapproximated using 0 vicryl interrupted stitches. The fascia was then closed using 0 Vicryl in a running fashion.  The subcutaneous layer was reapproximated with plain gut and the skin was closed in a subcuticular fashion using 3.0 Vicryl. The patient tolerated the procedure well. Sponge, lap, instrument and needle counts were correct x 2. She was taken to the recovery room in stable condition.    Meghan Reeves ConstantMD  09/07/2017 7:50 PM

## 2017-09-07 NOTE — Progress Notes (Signed)
Labor Progress Note Meghan Reeves is a 36 y.o. G2P0010 at [redacted]w[redacted]d presented for IOL for postdates S: No complaints  O:  BP 114/64   Pulse 97   Temp 98.2 F (36.8 C) (Oral)   Resp 16   Ht 5' 0.5" (1.537 m)   Wt 184 lb (83.5 kg)   LMP 11/23/2016 Comment: neg preg test today  SpO2 98%   BMI 35.34 kg/m  EFM: 140/pos acels/mod var  CVE: Dilation: 6.5 Effacement (%): 70 Cervical Position: Posterior Station: -2 Presentation: Vertex Exam by:: Dr. Manus Rudd   A&P: 36 y.o. G2P0010 [redacted]w[redacted]d here for IOL for postdates #Labor: Has been around 6 cm for last few hours. Start pitocin and placed IUPC. #FWB: cat 1   Meghan Kautzman, DO 7:23 AM

## 2017-09-07 NOTE — Progress Notes (Signed)
Labor Progress Note Meghan Reeves is a 36 y.o. G2P0010 at [redacted]w[redacted]d presented for IOL for post dates.  S: Spanish Medical interpreter present in room Patient reports that she feels ok. Pain is well managed at this time with epidural. Reports feeling contractions and pressure. Denies headaches at this time.  O:  BP (!) 119/56   Pulse 99   Temp 97.6 F (36.4 C) (Oral)   Resp 20   Ht 5' 0.5" (1.537 m)   Wt 184 lb (83.5 kg)   LMP 11/23/2016 Comment: neg preg test today  SpO2 98%   BMI 35.34 kg/m  EFM: 155/ mod vari/ reactive  CVE: Dilation: 6.5 Effacement (%): 70 Cervical Position: Posterior Station: -2 Presentation: Vertex Exam by:: Dr. Manus Rudd   A&P: 36 y.o. G2P0010 [redacted]w[redacted]d presented for IOL for post dates. #Labor: Progressing well. Patient with IUPC and pit@0645  #Pain: epidural #FWB: cat 1 #GBS negative   Ovidio Kin, MD 10:23 AM

## 2017-09-07 NOTE — Transfer of Care (Signed)
Immediate Anesthesia Transfer of Care Note  Patient: Meghan Reeves  Procedure(s) Performed: CESAREAN SECTION (N/A )  Patient Location: PACU  Anesthesia Type:Epidural  Level of Consciousness: awake, alert  and oriented  Airway & Oxygen Therapy: Patient Spontanous Breathing  Post-op Assessment: Report given to RN and Post -op Vital signs reviewed and stable  Post vital signs: Reviewed and stable  Last Vitals:  Vitals:   09/07/17 1834 09/07/17 1841  BP: 133/77 122/72  Pulse: (!) 110   Resp:    Temp:    SpO2:      Last Pain:  Vitals:   09/07/17 1845  TempSrc:   PainSc: 0-No pain         Complications: No apparent anesthesia complications

## 2017-09-07 NOTE — Progress Notes (Signed)
Labor Progress Note Meghan Reeves is a 36 y.o. G2P0010 at [redacted]w[redacted]d presented for IOL for PD  S:  Comfortable with epidural. No c/o.  O:  BP 114/69   Pulse 90   Temp 98.8 F (37.1 C) (Axillary)   Resp 18   Ht 5' 0.5" (1.537 m)   Wt 184 lb (83.5 kg)   LMP 11/23/2016 Comment: neg preg test today  SpO2 98%   BMI 35.34 kg/m  EFM: baseline 155 bpm/ mod variability/ no accels/ rare variable decels  Toco: 2-3 SVE: Dilation: 7 Effacement (%): 80 Cervical Position: Middle Station: -2 Presentation: Vertex Exam by:: Kloey Cazarez Pitocin: 28 mu/min MVU: 155  A/P: 36 y.o. G2P0010 [redacted]w[redacted]d  1. Labor: arrested 2. FWB: Cat II 3. Pain: epidural  No cervical change in 14 hrs, on Pitocin since 0600. Just recently progressing into adequate labor. Will continue Pitocin and recheck in 2 hrs. MOD guarded. Dr. Elly Modena updated with plan. Interpreter present for exam  Julianne Handler, CNM 4:53 PM

## 2017-09-08 ENCOUNTER — Encounter (HOSPITAL_COMMUNITY): Payer: Self-pay | Admitting: Obstetrics and Gynecology

## 2017-09-08 LAB — CBC
HCT: 27.1 % — ABNORMAL LOW (ref 36.0–46.0)
Hemoglobin: 9.7 g/dL — ABNORMAL LOW (ref 12.0–15.0)
MCH: 34.9 pg — ABNORMAL HIGH (ref 26.0–34.0)
MCHC: 35.8 g/dL (ref 30.0–36.0)
MCV: 97.5 fL (ref 78.0–100.0)
PLATELETS: 130 10*3/uL — AB (ref 150–400)
RBC: 2.78 MIL/uL — ABNORMAL LOW (ref 3.87–5.11)
RDW: 13.6 % (ref 11.5–15.5)
WBC: 23.1 10*3/uL — AB (ref 4.0–10.5)

## 2017-09-08 MED ORDER — LACTATED RINGERS IV BOLUS (SEPSIS)
500.0000 mL | Freq: Once | INTRAVENOUS | Status: AC
  Administered 2017-09-08: 500 mL via INTRAVENOUS

## 2017-09-08 NOTE — Lactation Note (Signed)
This note was copied from a baby's chart. Lactation Consultation Note  Patient Name: Meghan Reeves WYOVZ'C Date: 09/08/2017 Reason for consult: Initial assessment;Term;1st time breastfeeding;[place]  Visited with first time Mom of baby born by C/S at [redacted]w[redacted]d, and is 61 hrs old. Mom states baby wouldn't latch to breast, so she has been formula feeding baby by bottle.  Offered to assist with positioning and latch, but Mom declined saying baby had just eaten.  Encouraged Mom to call her nurse for assistance at next feeding. Recommended STS, and feeding baby often on cue. If baby isn't going to the breast, recommended breast massage, hand expression and double pumping on initiation setting.  Offered to assist with first pumping, but Mom declined saying she was going for a walk.  DEBP set up in room, but Mom states she hasn't pumped yet. Lactation brochure given.   To follow up in am, or prn when Mom requests assistance with latching baby to breast.   Consult Status Consult Status: Follow-up Date: 09/09/17 Follow-up type: In-patient    Meghan Reeves 09/08/2017, 10:47 PM

## 2017-09-08 NOTE — Progress Notes (Signed)
I assisted Dr Ambrose Pancoast from Anesthesia Department with explanation of care, I was present at the C-Section with Dr Elly Modena also I assisted her with explanation of care, by Juliann Mule Spanish Interpreter.

## 2017-09-08 NOTE — Progress Notes (Signed)
Post Partum Day 1 Subjective: tolerating PO, + flatus and some dizziness upon standing. She has not had any bowel movements yet and still has her foley in.  Objective: Blood pressure 109/65, pulse 78, temperature 98.3 F (36.8 C), temperature source Oral, resp. rate 18, height 5' 0.5" (1.537 m), weight 83.5 kg (184 lb), last menstrual period 11/23/2016, SpO2 97 %, unknown if currently breastfeeding.  Physical Exam:  General: alert and cooperative Lochia: appropriate Uterine Fundus: firm Incision: healing well, small amount of blood on the right side DVT Evaluation: No evidence of DVT seen on physical exam.   Recent Labs  09/07/17 2232 09/08/17 0512  HGB 10.8* 9.7*  HCT 31.1* 27.1*    Assessment/Plan: Plan for discharge tomorrow, Breastfeeding and Contraception method: nexplanon  Remove foley catheter, fluid bolus for dizziness, patient should be up and walking around.   LOS: 2 days   Rodell Perna, PA-S 09/08/2017, 7:51 AM

## 2017-09-08 NOTE — Progress Notes (Signed)
Mom expressed interest in pumping, Baby still not latching well, getting frustrated.  Mom used manual pump and expressed 40ml colostrum and gave to baby in bottle with formula.  Set up DEBP for future pumping

## 2017-09-09 ENCOUNTER — Encounter (HOSPITAL_COMMUNITY): Payer: Self-pay

## 2017-09-09 MED ORDER — OXYCODONE HCL 10 MG PO TABS
10.0000 mg | ORAL_TABLET | Freq: Four times a day (QID) | ORAL | 0 refills | Status: DC | PRN
Start: 2017-09-09 — End: 2017-10-21

## 2017-09-09 MED ORDER — SENNOSIDES-DOCUSATE SODIUM 8.6-50 MG PO TABS: 2.0000 | ORAL_TABLET | Freq: Every evening | ORAL | 0 refills | Status: DC | PRN

## 2017-09-09 MED ORDER — IBUPROFEN 600 MG PO TABS: 600.0000 mg | ORAL_TABLET | Freq: Four times a day (QID) | ORAL | 1 refills | Status: DC | PRN

## 2017-09-09 MED ORDER — OXYCODONE HCL 10 MG PO TABS: 10.0000 mg | ORAL_TABLET | Freq: Four times a day (QID) | ORAL | 0 refills | Status: DC | PRN

## 2017-09-09 NOTE — Lactation Note (Signed)
This note was copied from a baby's chart. Lactation Consultation Note  Patient Name: Meghan Reeves DGLOV'F Date: 09/09/2017   Baby 46 hours old. Mom called out for assistance with baby. Mom feeding baby a bottle and thought baby becoming choked--though baby crying loudly. Demonstrated to mom how to hold baby and pat baby's back, and made sure bulb syringe in the crib. Changed baby's wet diaper and rewrapped with blanket. Placed baby in mom's arms and mom resumed feeding baby. Discussed interventions with Leretha Dykes, RN.   Maternal Data    Feeding    LATCH Score                   Interventions    Lactation Tools Discussed/Used     Consult Status      Andres Labrum 09/09/2017, 9:06 AM

## 2017-09-09 NOTE — Discharge Instructions (Signed)
Cuidados en el postparto luego de un parto por cesrea (Postpartum Care After Cesarean Delivery) El perodo de tiempo que sigue inmediatamente al parto se conoce como puerperio. Center?  Podra continuar recibiendo medicamentos y lquidos travs de una va intravenosa (IV) que se Glass blower/designer en una de sus venas.  Probablemente tenga colocado un tubo flexible de drenaje (catter) que drenar la orina de la vejiga. El catter ser retirado tan pronto como sea apropiado.  Es posible que le den una botella rociadora para que use cuando vaya al bao. Puede utilizarla hasta que se sienta cmoda limpindose de la manera habitual. Siga los pasos a continuacin para usar la botella rociadora: ? Antes de orinar, llene la botella rociadora con agua tibia. El agua debe estar tibia. No use agua caliente. ? Despus de Garment/textile technologist, [place] an est sentada en el inodoro, use la botella rociadora para enjuagar el rea alrededor de la uretra y la abertura vaginal. Con esto podr limpiar cualquier rastro de orina y [place]. ? Puede hacer esto en lugar de secarse. Cuando comience a Scientist, research (medical), podr usar la botella rociadora antes de secarse. Asegrese de secarse suavemente. ? Llene la botella rociadora con agua limpia cada vez que vaya al bao.  Deber usar apsitos sanitarios.  Le controlarn la incisin para asegurarse de que est cicatrizando correctamente. Le indicarn cuando es seguro retirar los puntos, las grapas o la cinta adhesiva para la piel. QU PUEDO ESPERAR?  Quizs no tenga necesidad de orinar durante varias horas despus del parto.  Sentir algo de dolor y [place] en el abdomen. Puede tener una pequea secrecin de sangre o de lquido transparente que proviene de la incisin.  Si est amamantando, podra tener contracciones uterinas cada vez que lo haga. Estas podran prolongarse hasta varias semanas durante el puerperio. Las contracciones uterinas ayudan al tero a  Stage manager a su tamao habitual.  Es normal tener un poco de hemorragia vaginal (loquios) despus del [place]. La cantidad y apariencia de los loquios a menudo es similar a las del perodo menstrual la primera semana despus del [place]. Disminuir gradualmente las siguientes semanas hasta convertirse en una descarga seca amarronada o [place]. En la [employer], los loquios se detienen [employer] 6 a 8semanas despus del Muse. Los sangrados vaginales pueden variar de mujer a Art therapist.  Los primeros das despus del parto, podra padecer [place]. Los pechos se sentirn pesados, llenos y molestos. Las mamas tambin podran latir y ponerse duras, muy tirantes, calientes y sensibles al tacto. Cuando esto [place], podra notar [employer] se escapa de los senos.El mdico puede recomendarle algunos mtodos para [place] causado por la [place]. La congestin mamaria debera desaparecer al cabo de [employer].  Podra sentirse ms deprimida o preocupada que lo habitual debido a los cambios hormonales luego del [place]. Estos sentimientos no deben durar ms de [employer]. Si no desaparecen al cabo de [employer], hable con su [place]?  Infrmele a su mdico si siente dolor o malestar.  Beba suficiente agua para mantener la orina clara o de color amarillo plido.  Lvese bien las manos con agua y jabn durante al menos 20segundos despus de cambiar el apsito sanitario, usar el bao y antes de Nature conservation officer o Research scientist (life sciences) al beb.  Si no est amamantando, evite tocarse mucho los senos. Al hacerlo, podran producir ms [employer].  Si se siente dbil o mareada, o si siente que est a punto de  desmayarse, pida ayuda antes de realizar lo siguiente: ? Levantarse de la cama. ? Ducharse.  Cambie los apsitos sanitarios con frecuencia. Observe si hay cambios en el flujo, como un aumento repentino en el volumen, cambios en el color o cogulos  sanguneos de gran tamao. Si expulsa un cogulo sanguneo por la vagina, gurdelo para mostrrselo a su mdico. No tire la cadena sin que el mdico examine el cogulo antes.  Asegrese de [place]. Esto la ayudar a Personnel officer protegida y a proteger al beb de determinadas enfermedades. Podra necesitar vacunas antes de dejar el hospital.  Si lo desea, hable con el mdico acerca de los mtodos de planificacin familiar o control de la natalidad (mtodos anticonceptivos). CMO PUEDO ESTABLECER LAZOS CON MI BEB? Pasar tanto tiempo como le sea posible con el beb es sumamente importante. Durante ese tiempo, usted y su beb pueden conocerse y Technical brewer. Tener al beb con usted en la habitacin le dar tiempo de conocerlo. Esto tambin puede hacerla sentir ms cmoda para atender al beb. Amamantar tambin puede ayudarla a crear lazos con el beb. CMO PUEDO PLANIFICAR MI REGRESO A CASA CON EL BEB?  Asegrese de tener instalada una butaca en el automvil. ? La butaca debe contar con la certificacin del fabricante para asegurarse de que est instalada en forma segura. ? Asegrese de que el beb quede bien asegurado en la butaca.  Pregntele al mdico todo lo que necesite saber sobre los cuidados de su beb. Asegrese de poder comunicarse con el mdico en caso de que tenga preguntas luego de dejar el hospital. Esta informacin no tiene Marine scientist el consejo del mdico. Asegrese de hacerle al mdico cualquier pregunta que tenga. Document Released: 10/12/2012 Document Revised: 02/17/2016 Document Reviewed: 09/30/2015 Elsevier Interactive Patient Education  [employer].

## 2017-09-09 NOTE — Discharge Summary (Signed)
OB Discharge Summary     Patient Name: Meghan Reeves DOB: 07-19-1981 MRN: [id number]  Date of admission: 09/06/2017 Delivering MD: Mora Bellman   Date of discharge: 09/09/2017  Admitting diagnosis: 41wks, induction Intrauterine pregnancy: [redacted]w[redacted]d     Secondary diagnosis:  Active Problems:   Post-dates pregnancy  Additional problems: Acute appendicitis with peritonitis s/p laparoscopic appendectomy     Discharge diagnosis: Term Pregnancy Delivered                                                                                                Post partum procedures:n/a  Augmentation: Pitocin, Cytotec and Foley Balloon  Complications: None  Hospital course:  Induction of Labor With Cesarean Section  36 y.o. yo G2P1011 at [redacted]w[redacted]d was admitted to the hospital 09/06/2017 for induction of labor. Patient had a labor course significant for using the usual cx ripening procedures and becoming approx 7cm with failure to progress despite adequate labor x 3hr. The patient went for cesarean section due to failure to progress, and delivered a Viable infant,@ Membrane Rupture Time/Date: )6:19 PM ,09/06/2017   Details of operation can be found in separate operative Note.  Patient had an uncomplicated postpartum course. She is ambulating, tolerating a regular diet, passing flatus, and urinating well.  Patient is discharged home in stable condition on 09/09/17.                                    Physical exam  Vitals:   09/08/17 0745 09/08/17 1130 09/08/17 1700 09/09/17 0452  BP:  111/69 109/65 119/69  Pulse: 82 78 81 75  Resp: 18 18 20 20   Temp:  98.3 F (36.8 C) 99 F (37.2 C) 98.5 F (36.9 C)  TempSrc:    Oral  SpO2: 98% 99% 98%   Weight:      Height:       General: alert, cooperative and no distress Lochia: appropriate Uterine Fundus: firm Incision: Healing well with no significant drainage, No significant erythema, Dressing is clean, dry, and intact DVT Evaluation: No evidence of  DVT seen on physical exam. No cords or calf tenderness. Labs: Lab Results  Component Value Date   WBC 23.1 (H) 09/08/2017   HGB 9.7 (L) 09/08/2017   HCT 27.1 (L) 09/08/2017   MCV 97.5 09/08/2017   PLT 130 (L) 09/08/2017   CMP Latest Ref Rng & Units 09/07/2017  Glucose 65 - 99 mg/dL -  BUN 6 - 20 mg/dL -  Creatinine 0.44 - 1.00 mg/dL 0.95  Sodium 135 - 145 mmol/L -  Potassium 3.5 - 5.1 mmol/L -  Chloride 101 - 111 mmol/L -  CO2 22 - 32 mmol/L -  Calcium 8.9 - 10.3 mg/dL -  Total Protein 6.5 - 8.1 g/dL -  Total Bilirubin 0.3 - 1.2 mg/dL -  Alkaline Phos 38 - 126 U/L -  AST 15 - 41 U/L -  ALT 14 - 54 U/L -    Discharge instruction: per After Visit Summary and "Baby and Me Booklet".  After visit meds:  Allergies as of 09/09/2017   No Known Allergies     Medication List    TAKE these medications   acetaminophen 325 MG tablet Commonly known as:  TYLENOL Take 650 mg by mouth every 6 (six) hours as needed for headache.   calcium carbonate 500 MG chewable tablet Commonly known as:  TUMS - dosed in mg elemental calcium Chew 2 tablets by mouth daily.   ibuprofen 600 MG tablet Commonly known as:  ADVIL,MOTRIN Take 1 tablet (600 mg total) by mouth every 6 (six) hours as needed.   Oxycodone HCl 10 MG Tabs Take 1 tablet (10 mg total) by mouth every 6 (six) hours as needed (pain scale > 7).   prenatal multivitamin Tabs tablet Take 1 tablet by mouth daily at 12 noon.   senna-docusate 8.6-50 MG tablet Commonly known as:  Senokot-S Take 2 tablets by mouth at bedtime as needed for mild constipation.            Discharge Care Instructions        Start     Ordered   09/09/17 0000  Change dressing (specify)    Comments:  Dressing change: You may shower with the dressing on. Please leave in place for 5-7 days. It will start to come off on its on. If it is still in place after 7 days you may remove all dressing and strips.   09/09/17 0747      Diet: routine  diet  Activity: Advance as tolerated. Pelvic rest for 6 weeks.   Outpatient follow up:4 weeks Follow up Appt:No future appointments. Follow up Visit:No Follow-up on file.  Postpartum contraception: Nexplanon  Newborn Data: Live born female  Birth Weight: 10 lb 0.3 oz (4545 g) APGAR: 9, 9  Newborn Delivery   Birth date/time:  09/07/2017 19:11:00 Delivery type:  C-Section, Low Transverse  C-section categorization:  Primary     Baby Feeding: Breast Disposition:home with mother   09/09/2017 [name], DO  CNM attestation I have seen and examined this patient and agree with above documentation in the resident's note.   Meghan Reeves is a 36 y.o. G2P1011 s/p pLTCS for FTP.   Pain is well controlled.  Plan for birth control is Nexplanon.  Method of Feeding: breast  PE:  BP 119/69 (BP Location: Right Arm)   Pulse 75   Temp 98.5 F (36.9 C) (Oral)   Resp 20   Ht 5' 0.5" (1.537 m)   Wt 83.5 kg (184 lb)   LMP 11/23/2016 Comment: neg preg test today  SpO2 98%   Breastfeeding? Unknown   BMI 35.34 kg/m  Fundus firm   Recent Labs  09/07/17 2232 09/08/17 0512  HGB 10.8* 9.7*  HCT 31.1* 27.1*     Plan: discharge today - postpartum care discussed - f/u clinic in 4 weeks for postpartum visit   Serita Grammes, CNM 10:16 AM 09/09/2017

## 2017-09-09 NOTE — Plan of Care (Signed)
Problem: Education: Goal: Knowledge of condition will improve Used Meghan Reeves, in house interpreter, this morning for assessment, questions and education. Encouraged increased ambulation in hallway.

## 2017-10-21 ENCOUNTER — Inpatient Hospital Stay (HOSPITAL_COMMUNITY)
Admission: AD | Admit: 2017-10-21 | Discharge: 2017-10-21 | Disposition: A | Discharge status: Home / Self Care | Payer: Self-pay | Source: Ambulatory Visit | Attending: Family Medicine | Admitting: Family Medicine

## 2017-10-21 ENCOUNTER — Encounter (HOSPITAL_COMMUNITY): Payer: Self-pay | Admitting: *Deleted

## 2017-10-21 DIAGNOSIS — M545 Low back pain, unspecified: Secondary | ICD-10-CM

## 2017-10-21 DIAGNOSIS — G8929 Other chronic pain: Secondary | ICD-10-CM | POA: Insufficient documentation

## 2017-10-21 DIAGNOSIS — F17290 Nicotine dependence, other tobacco product, uncomplicated: Secondary | ICD-10-CM | POA: Insufficient documentation

## 2017-10-21 DIAGNOSIS — G44209 Tension-type headache, unspecified, not intractable: Secondary | ICD-10-CM | POA: Insufficient documentation

## 2017-10-21 LAB — POCT PREGNANCY, URINE: PREG TEST UR: NEGATIVE

## 2017-10-21 MED ORDER — KETOROLAC TROMETHAMINE 60 MG/2ML IM SOLN
60.0000 mg | Freq: Once | INTRAMUSCULAR | Status: AC
  Administered 2017-10-21: 60 mg via INTRAMUSCULAR
  Filled 2017-10-21: qty 2

## 2017-10-21 NOTE — MAU Note (Signed)
Pitcher of water given earlier, before injection for pain given.  Pt. Tolerated water well.

## 2017-10-21 NOTE — MAU Note (Signed)
Urine in lab 

## 2017-10-21 NOTE — MAU Note (Addendum)
Patient delivered October 30th, via c-section, viable baby boy. C-section for FTP. Pt. Presents with headache and back pain since delivery.  Pt. Took 2-Tylenol mid-day yesterday.

## 2017-10-21 NOTE — MAU Note (Signed)
Called to patient's room, patient ready to go, she has another appointment.  Per patient her pain is "better" now 3/10.

## 2017-10-21 NOTE — MAU Provider Note (Signed)
Chief Complaint: Back Pain and Headache   First Provider Initiated Contact with Patient 10/21/17 1252      SUBJECTIVE HPI: Meghan Reeves is a 36 y.o. G2P1011 PP s/p C/S on 09/07/17 who presents to maternity admissions reporting headache and back pain.   Headache   This is a recurrent problem. The current episode started yesterday. The problem occurs daily. The problem has been unchanged. The pain is located in the right unilateral region. The pain does not radiate. The pain quality is similar to prior headaches. Quality: "pin and needles" The pain is moderate. Associated symptoms include back pain. Pertinent negatives include no abdominal pain, blurred vision, dizziness, fever, nausea, visual change, vomiting or weakness. Nothing aggravates the symptoms. She has tried acetaminophen for the symptoms. The treatment provided significant relief. There is no history of hypertension.  Back Pain  This is a recurrent problem. The current episode started more than 1 month ago. The problem occurs constantly. The problem is unchanged. The pain is present in the lumbar spine. The quality of the pain is described as aching. The pain does not radiate. The pain is moderate. The pain is the same all the time. Exacerbated by: constant since pregnancy  Associated symptoms include headaches. Pertinent negatives include no abdominal pain, chest pain, fever, leg pain, pelvic pain or weakness. She has tried analgesics for the symptoms. The treatment provided significant relief.   She reports that she drink 1-2 small bottles of water per day. She denies epigastric pain, edema, vaginal bleeding, vaginal itching/burning, urinary symptoms, dizziness, n/v, or fever/chills.    Past Medical History:  Diagnosis Date  . Medical history non-contributory    Past Surgical History:  Procedure Laterality Date  . APPENDECTOMY     Nov 2017  . CESAREAN SECTION N/A 09/07/2017   Procedure: CESAREAN SECTION;  Surgeon: Mora Bellman, MD;  Location: [facility];  Service: Obstetrics;  Laterality: N/A;  . LAPAROSCOPIC APPENDECTOMY N/A 09/29/2016   Procedure: APPENDECTOMY LAPAROSCOPIC;  Surgeon: Greer Pickerel, MD;  Location: [facility];  Service: General;  Laterality: N/A;  . NO PAST SURGERIES     Social History   Socioeconomic History  . Marital status: Single    Spouse name: Not on file  . Number of children: Not on file  . Years of education: Not on file  . Highest education level: Not on file  Social Needs  . Financial resource strain: Not on file  . Food insecurity - worry: Not on file  . Food insecurity - inability: Not on file  . Transportation needs - medical: Not on file  . Transportation needs - non-medical: Not on file  Occupational History  . Not on file  Tobacco Use  . Smoking status: Current Every Day Smoker    Packs/day: 0.25    Years: 10.00    Pack years: 2.50    Types: Cigars  . Smokeless tobacco: Never Used  Substance and Sexual Activity  . Alcohol use: No    Alcohol/week: 0.0 oz  . Drug use: No  . Sexual activity: Yes    Birth control/protection: None  Other Topics Concern  . Not on file  Social History Narrative  . Not on file   No current facility-administered medications on file prior to encounter.    Current Outpatient Medications on File Prior to Encounter  Medication Sig Dispense Refill  . acetaminophen (TYLENOL) 325 MG tablet Take 650 mg by mouth every 6 (six) hours as needed for headache.     Marland Kitchen  calcium carbonate (TUMS - DOSED IN MG ELEMENTAL CALCIUM) 500 MG chewable tablet Chew 2 tablets by mouth daily.    Marland Kitchen ibuprofen (ADVIL,MOTRIN) 600 MG tablet Take 1 tablet (600 mg total) by mouth every 6 (six) hours as needed. 40 tablet 1  . Oxycodone HCl 10 MG TABS Take 1 tablet (10 mg total) by mouth every 6 (six) hours as needed (pain scale > 7). 20 tablet 0  . Prenatal Vit-Fe Fumarate-FA (PRENATAL MULTIVITAMIN) TABS tablet Take 1 tablet by mouth daily at 12 noon.    .  senna-docusate (SENOKOT-S) 8.6-50 MG tablet Take 2 tablets by mouth at bedtime as needed for mild constipation. 6 tablet 0   No Known Allergies  ROS:  Review of Systems  Constitutional: Negative for chills and fever.  Eyes: Negative for blurred vision.  Respiratory: Negative.   Cardiovascular: Negative for chest pain.  Gastrointestinal: Negative for abdominal pain, nausea and vomiting.  Genitourinary: Negative for difficulty urinating, frequency, pelvic pain, vaginal bleeding and vaginal discharge.  Musculoskeletal: Positive for back pain.  Skin: Negative.   Neurological: Positive for headaches. Negative for dizziness, weakness and light-headedness.  Psychiatric/Behavioral: Negative.    I have reviewed patient's Past Medical Hx, Surgical Hx, Family Hx, Social Hx, medications and allergies.   Physical Exam   Patient Vitals for the past 24 hrs:  BP Temp Temp src Pulse Resp SpO2 Weight  10/21/17 1255 - - - - - 99 % -  10/21/17 1253 - - - - - 99 % -  10/21/17 1248 - - - - - 99 % -  10/21/17 1243 - - - - - 99 % -  10/21/17 1241 108/69 98.6 F (37 C) Oral 87 17 99 % -  10/21/17 1238 - - - - - - 130 lb (59 kg)   Constitutional: Well-developed, well-nourished female in no acute distress.  Cardiovascular: normal rate Respiratory: normal effort GI: Abd soft, non-tender. Pos BS x 4 MS: Extremities nontender, no edema, normal ROM, DTR +1  Neurologic: Alert and oriented x 4.  GU: Neg CVAT. Skin: C/S incision- healed with no edema or tenderness around incision, patient had homemade bandage on incision that was removed.    LAB RESULTS Results for orders placed or performed during the hospital encounter of 10/21/17 (from the past 24 hour(s))  Pregnancy, urine POC     Status: None   Collection Time: 10/21/17 12:34 PM  Result Value Ref Range   Preg Test, Ur NEGATIVE NEGATIVE    --/--/A POS (10/29 6301)  MAU Management/MDM: Ordered labs and reviewed results. Treatments in MAU  included Toradol 60mg  IM. Pt discharged with education and instructions to increase fluid intake and take Tylenol as needed for pain.   ASSESSMENT 1. Acute non intractable tension-type headache   2. Chronic bilateral low back pain without sciatica     PLAN Discharge home Follow up as scheduled and return to MAU as needed for emergencies.    Allergies as of 10/21/2017   No Known Allergies     Medication List    STOP taking these medications   Oxycodone HCl 10 MG Tabs     TAKE these medications   acetaminophen 325 MG tablet Commonly known as:  TYLENOL Take 650 mg by mouth every 6 (six) hours as needed for headache.   ibuprofen 600 MG tablet Commonly known as:  ADVIL,MOTRIN Take 1 tablet (600 mg total) by mouth every 6 (six) hours as needed.   prenatal multivitamin Tabs tablet Take 1 tablet  by mouth daily at 12 noon.   senna-docusate 8.6-50 MG tablet Commonly known as:  Senokot-S Take 2 tablets by mouth at bedtime as needed for mild constipation.      Darrol Poke  Certified Nurse-Midwife 10/21/2017  1:10 PM

## 2018-03-11 ENCOUNTER — Other Ambulatory Visit: Payer: Self-pay | Admitting: Certified Nurse Midwife

## 2018-09-18 ENCOUNTER — Inpatient Hospital Stay (HOSPITAL_COMMUNITY)
Admission: EM | Admit: 2018-09-18 | Discharge: 2018-09-18 | Disposition: A | Discharge status: Home / Self Care | Payer: Self-pay | Source: Ambulatory Visit | Attending: Obstetrics & Gynecology | Admitting: Obstetrics & Gynecology

## 2018-09-18 ENCOUNTER — Encounter (HOSPITAL_COMMUNITY): Payer: Self-pay

## 2018-09-18 DIAGNOSIS — R519 Headache, unspecified: Secondary | ICD-10-CM

## 2018-09-18 DIAGNOSIS — R51 Headache: Secondary | ICD-10-CM | POA: Insufficient documentation

## 2018-09-18 DIAGNOSIS — Z3202 Encounter for pregnancy test, result negative: Secondary | ICD-10-CM | POA: Insufficient documentation

## 2018-09-18 DIAGNOSIS — F17210 Nicotine dependence, cigarettes, uncomplicated: Secondary | ICD-10-CM | POA: Insufficient documentation

## 2018-09-18 LAB — URINALYSIS, ROUTINE W REFLEX MICROSCOPIC
Bilirubin Urine: NEGATIVE
GLUCOSE, UA: NEGATIVE mg/dL
HGB URINE DIPSTICK: NEGATIVE
KETONES UR: NEGATIVE mg/dL
LEUKOCYTES UA: NEGATIVE
Nitrite: NEGATIVE
PROTEIN: NEGATIVE mg/dL
Specific Gravity, Urine: 1.016 (ref 1.005–1.030)
pH: 6 (ref 5.0–8.0)

## 2018-09-18 LAB — POCT PREGNANCY, URINE: Preg Test, Ur: NEGATIVE

## 2018-09-18 MED ORDER — IBUPROFEN 600 MG PO TABS
600.0000 mg | ORAL_TABLET | Freq: Once | ORAL | Status: AC
  Administered 2018-09-18: 600 mg via ORAL
  Filled 2018-09-18: qty 1

## 2018-09-18 MED ORDER — IBUPROFEN 600 MG PO TABS: 600.0000 mg | ORAL_TABLET | Freq: Four times a day (QID) | ORAL | 2 refills | Status: DC | PRN

## 2018-09-18 NOTE — MAU Note (Signed)
Reports blurry vision when looking in the distance  Now having headaches, tried tylenol with brief relief

## 2018-09-18 NOTE — MAU Provider Note (Addendum)
Chief Complaint: Blurred Vision and Headache   First Provider Initiated Contact with Patient 09/18/18 1914     SUBJECTIVE HPI: Meghan Reeves is a 37 y.o. G52P1011 non-pregnant female who presents to Maternity Admissions reporting HA, blurry vision x 2 days. Pt inconsistent about whether she has a Hx of HA's or not. Also C/O pain in low back where she had epidural 13 months ago. Hasn't tried anything.   Location: bilat temples  Quality: throbbing Onset: Unable to describe Severity: severe Duration: 2-3 days Context: None Timing: Constant Modifying factors: Mild, temporary improvement w/ Tylenol.  Associated signs and symptoms: Pos for blurry vision, feeling tightness in her jaw (points to both sides)  Interpreter used.   Past Medical History:  Diagnosis Date  . Medical history non-contributory    OB History  Gravida Para Term Preterm AB Living  2 1 1  0 1 1  SAB TAB Ectopic Multiple Live Births  1 0 0 0 1    # Outcome Date GA Lbr Len/2nd Weight Sex Delivery Anes PTL Lv  2 Term 09/07/17 [redacted]w[redacted]d  4545 g M CS-LTranv EPI  LIV  1 SAB            Past Surgical History:  Procedure Laterality Date  . APPENDECTOMY     Nov 2017  . CESAREAN SECTION N/A 09/07/2017   Procedure: CESAREAN SECTION;  Surgeon: Mora Bellman, MD;  Location: [facility];  Service: Obstetrics;  Laterality: N/A;  . LAPAROSCOPIC APPENDECTOMY N/A 09/29/2016   Procedure: APPENDECTOMY LAPAROSCOPIC;  Surgeon: Greer Pickerel, MD;  Location: [facility];  Service: General;  Laterality: N/A;  . NO PAST SURGERIES     Social History   Socioeconomic History  . Marital status: Single    Spouse name: Not on file  . Number of children: Not on file  . Years of education: Not on file  . Highest education level: Not on file  Occupational History  . Not on file  Social Needs  . Financial resource strain: Not on file  . Food insecurity:    Worry: Not on file    Inability: Not on file  . Transportation needs:   Medical: Not on file    Non-medical: Not on file  Tobacco Use  . Smoking status: Current Every Day Smoker    Packs/day: 0.25    Years: 10.00    Pack years: 2.50    Types: Cigars  . Smokeless tobacco: Never Used  Substance and Sexual Activity  . Alcohol use: No    Alcohol/week: 0.0 standard drinks  . Drug use: No  . Sexual activity: Yes    Birth control/protection: None  Lifestyle  . Physical activity:    Days per week: Not on file    Minutes per session: Not on file  . Stress: Not on file  Relationships  . Social connections:    Talks on phone: Not on file    Gets together: Not on file    Attends religious service: Not on file    Active member of club or organization: Not on file    Attends meetings of clubs or organizations: Not on file    Relationship status: Not on file  . Intimate partner violence:    Fear of current or ex partner: Not on file    Emotionally abused: Not on file    Physically abused: Not on file    Forced sexual activity: Not on file  Other Topics Concern  . Not on file  Social  History Narrative  . Not on file   Family History  Problem Relation Age of Onset  . Asthma Mother    No current facility-administered medications on file prior to encounter.    Current Outpatient Medications on File Prior to Encounter  Medication Sig Dispense Refill  . acetaminophen (TYLENOL) 325 MG tablet Take 650 mg by mouth every 6 (six) hours as needed for headache.     . senna-docusate (SENOKOT-S) 8.6-50 MG tablet Take 2 tablets by mouth at bedtime as needed for mild constipation. (Patient not taking: Reported on 10/21/2017) 6 tablet 0   No Known Allergies  I have reviewed patient's Past Medical Hx, Surgical Hx, Family Hx, Social Hx, medications and allergies.   Review of Systems  Constitutional: Negative for chills and fever.  HENT: Negative for congestion.   Eyes: Positive for visual disturbance. Negative for photophobia and pain.  Musculoskeletal: Positive  for back pain. Negative for neck pain.  Neurological: Positive for headaches. Negative for dizziness, facial asymmetry, speech difficulty, weakness and numbness.  Psychiatric/Behavioral: Negative for confusion.    OBJECTIVE Patient Vitals for the past 24 hrs:  BP Temp Temp src Pulse Resp Weight  09/18/18 1840 109/72 98.2 F (36.8 C) Oral 97 16 71.4 kg   Constitutional: Well-developed, well-nourished female in no acute distress.  Cardiovascular: normal rate Respiratory: normal rate and effort.  GI: Deferred Neurologic: Alert and oriented x 4. Nml speech and gait. Symmetric facial mvmts.  GU: Deferred  LAB RESULTS Results for orders placed or performed during the hospital encounter of 09/18/18 (from the past 24 hour(s))  Urinalysis, Routine w reflex microscopic     Status: None   Collection Time: 09/18/18  6:54 PM  Result Value Ref Range   Color, Urine YELLOW YELLOW   APPearance CLEAR CLEAR   Specific Gravity, Urine 1.016 1.005 - 1.030   pH 6.0 5.0 - 8.0   Glucose, UA NEGATIVE NEGATIVE mg/dL   Hgb urine dipstick NEGATIVE NEGATIVE   Bilirubin Urine NEGATIVE NEGATIVE   Ketones, ur NEGATIVE NEGATIVE mg/dL   Protein, ur NEGATIVE NEGATIVE mg/dL   Nitrite NEGATIVE NEGATIVE   Leukocytes, UA NEGATIVE NEGATIVE  Pregnancy, urine POC     Status: None   Collection Time: 09/18/18  6:59 PM  Result Value Ref Range   Preg Test, Ur NEGATIVE NEGATIVE    IMAGING No results found.  MAU COURSE Orders Placed This Encounter  Procedures  . Urinalysis, Routine w reflex microscopic  . Pregnancy, urine POC   Meds ordered this encounter  Medications  . ibuprofen (ADVIL,MOTRIN) 600 MG tablet    Sig: Take 1 tablet (600 mg total) by mouth every 6 (six) hours as needed.    Dispense:  30 tablet    Refill:  2    Order Specific Question:   Supervising Provider    Answer:   Lavonia Drafts [id number]  . ibuprofen (ADVIL,MOTRIN) tablet 600 mg   Explained that MAU specializes in OB/Gyn care.  Pt does not appear to be experiencing an emergent HA, but reports that it is severe. Suggested that she be transported to ED tonight if it feels like an emergent HA or F/U w/ PCP, Urgent care tomorrow if it in non-emergent. Pt difficult to communicate with even with live Spanish interpreter. Gave conflicting responses to questions about severity of Sx. Will try IBU for HA and back pain. Will send to ED if severe HA persists.   Care of pt turned over to Jorje Guild, NP at 2000.  Tamala Julian, [place], [place] 09/18/2018  8:00 PM   A: 1. Acute nonintractable headache, unspecified headache type    P: Discharge home F/u with PCP F/u with urgent care or ED if symptoms return or worsen  Jorje Guild, NP

## 2018-09-18 NOTE — Discharge Instructions (Signed)
Dolor de cabeza general sin causa General Headache Without Cause El dolor de cabeza es un dolor o malestar que se siente en la zona de la cabeza o del cuello. Puede no tener una causa especfica. Hay muchas causas y tipos de dolores de [place]. Los dolores de cabeza ms comunes son los siguientes:  Cefalea tensional.  Cefaleas migraosas.  Cefalea en brotes.  Cefaleas diarias crnicas.  Siga estas indicaciones en su casa:  Controle su afeccin para detectar cualquier cambio. Siga estos pasos para Building surveyor afeccin: Control del [employer] medicamentos de venta libre y los recetados solamente como se lo haya indicado el mdico.  Cuando sienta dolor de cabeza acustese en un cuarto oscuro y tranquilo.  Si se lo indican, aplique hielo sobre la cabeza y la zona del cuello: ? Ponga el hielo en una bolsa plstica. ? Coloque una [employer] piel y la bolsa de hielo. ? Deje el hielo durante 53minutos, 2 a 3veces por da.  Utilice una almohadilla trmica o tome una ducha con agua caliente para aplicar calor en la cabeza y la zona del cuello como se lo haya indicado el [place] luces tenues si las luces brillantes le [place] o sus dolores de cabeza empeoran. Qu debe comer y beber  Force un horario para las comidas.  Limite el consumo de bebidas alcohlicas.  Consuma menos cantidad de cafena o deje de tomarla. Instrucciones generales  Concurra a todas las visitas de seguimiento como se lo haya indicado el mdico. Esto es importante.  Lleve un diario de los dolores de cabeza para Neurosurgeon qu factores pueden desencadenarlos. Por ejemplo, escriba: ? Lo que usted come y bebe. ? Cunto tiempo duerme. ? Algn cambio en su dieta o en los medicamentos.  Pruebe algunas tcnicas de relajacin, como los [place].  Limite el estrs.  Sintese con la espalda recta y no tense los msculos.  No consuma productos que contengan tabaco, incluidos cigarrillos,  tabaco de Higher education careers adviser o cigarrillos electrnicos. Si necesita ayuda para dejar de fumar, consulte al mdico.  Haga actividad fsica habitualmente como se lo haya indicado el mdico.  Tenga un horario fijo para dormir. Duerma entre 7 y 9horas o la cantidad de horas que le haya recomendado el mdico. Comunquese con un mdico si:  Los medicamentos no Dealer los sntomas.  Tiene un dolor de cabeza que es diferente del dolor de cabeza habitual.  Tiene nuseas o vmitos.  Tiene fiebre. Solicite ayuda de inmediato si:  El dolor se vuelve cada vez ms intenso.  Ha vomitado repetidas veces.  Presenta rigidez en el cuello.  Sufre prdida de la visin.  Tiene problemas para hablar.  Siente dolor en el ojo o en el odo.  Presenta debilidad muscular o prdida del control muscular.  Pierde el equilibrio o tiene problemas para Writer.  Sufre mareos o se desmaya.  Experimenta confusin. Esta informacin no tiene Marine scientist el consejo del mdico. Asegrese de hacerle al mdico cualquier pregunta que tenga. Document Released: 08/05/2005 Document Revised: 02/15/2017 Document Reviewed: 02/18/2015 Elsevier Interactive Patient Education  [employer].

## 2018-09-22 ENCOUNTER — Emergency Department (HOSPITAL_COMMUNITY): Payer: Self-pay

## 2018-09-22 ENCOUNTER — Emergency Department (HOSPITAL_COMMUNITY)
Admission: EM | Admit: 2018-09-22 | Discharge: 2018-09-23 | Disposition: A | Discharge status: Home / Self Care | Payer: Self-pay | Attending: Emergency Medicine | Admitting: Emergency Medicine

## 2018-09-22 ENCOUNTER — Encounter (HOSPITAL_COMMUNITY): Payer: Self-pay | Admitting: Emergency Medicine

## 2018-09-22 DIAGNOSIS — R51 Headache: Secondary | ICD-10-CM | POA: Insufficient documentation

## 2018-09-22 DIAGNOSIS — R519 Headache, unspecified: Secondary | ICD-10-CM

## 2018-09-22 DIAGNOSIS — F17290 Nicotine dependence, other tobacco product, uncomplicated: Secondary | ICD-10-CM | POA: Insufficient documentation

## 2018-09-22 LAB — COMPREHENSIVE METABOLIC PANEL
ALK PHOS: 76 U/L (ref 38–126)
ALT: 62 U/L — AB (ref 0–44)
AST: 36 U/L (ref 15–41)
Albumin: 4 g/dL (ref 3.5–5.0)
Anion gap: 8 (ref 5–15)
BILIRUBIN TOTAL: 0.5 mg/dL (ref 0.3–1.2)
BUN: 11 mg/dL (ref 6–20)
CHLORIDE: 106 mmol/L (ref 98–111)
CO2: 22 mmol/L (ref 22–32)
CREATININE: 0.95 mg/dL (ref 0.44–1.00)
Calcium: 9.1 mg/dL (ref 8.9–10.3)
Glucose, Bld: 106 mg/dL — ABNORMAL HIGH (ref 70–99)
Potassium: 4.2 mmol/L (ref 3.5–5.1)
Sodium: 136 mmol/L (ref 135–145)
TOTAL PROTEIN: 7.1 g/dL (ref 6.5–8.1)

## 2018-09-22 LAB — I-STAT BETA HCG BLOOD, ED (MC, WL, AP ONLY)

## 2018-09-22 LAB — CBC
HCT: 44 % (ref 36.0–46.0)
Hemoglobin: 14.2 g/dL (ref 12.0–15.0)
MCH: 30.5 pg (ref 26.0–34.0)
MCHC: 32.3 g/dL (ref 30.0–36.0)
MCV: 94.4 fL (ref 80.0–100.0)
NRBC: 0 % (ref 0.0–0.2)
PLATELETS: 272 10*3/uL (ref 150–400)
RBC: 4.66 MIL/uL (ref 3.87–5.11)
RDW: 11.7 % (ref 11.5–15.5)
WBC: 8.9 10*3/uL (ref 4.0–10.5)

## 2018-09-22 MED ORDER — PROCHLORPERAZINE EDISYLATE 10 MG/2ML IJ SOLN
10.0000 mg | Freq: Once | INTRAMUSCULAR | Status: AC
  Administered 2018-09-22: 10 mg via INTRAMUSCULAR
  Filled 2018-09-22: qty 2

## 2018-09-22 MED ORDER — DIPHENHYDRAMINE HCL 50 MG/ML IJ SOLN
25.0000 mg | Freq: Once | INTRAMUSCULAR | Status: AC
  Administered 2018-09-22: 25 mg via INTRAMUSCULAR
  Filled 2018-09-22: qty 1

## 2018-09-22 MED ORDER — DEXAMETHASONE 4 MG PO TABS
10.0000 mg | ORAL_TABLET | Freq: Once | ORAL | Status: AC
  Administered 2018-09-22: 10 mg via ORAL
  Filled 2018-09-22: qty 3

## 2018-09-22 NOTE — ED Provider Notes (Signed)
[name] DEPARTMENT Provider Note   CSN: [id number] Arrival date & time: 09/22/18  1927     History   Chief Complaint Chief Complaint  Patient presents with  . Migraine    HPI Meghan Reeves is a 37 y.o. female.  37 yo F with a chief complaint of a headache.  This been going on for the past week.  Nothing seems to make it better or worse.  Seems to come and go.  Goes across the frontal region.  Denies cough congestion or fever.  Denies trauma denies neck pain.  Patient was seen in the [facility]U and had symptomatic therapy but the headache is returned.  She denies unilateral numbness or weakness.  The history is provided by the patient.  Migraine  This is a new problem. The current episode started 2 days ago. The problem occurs constantly. The problem has been gradually worsening. Associated symptoms include headaches. Pertinent negatives include no chest pain, no abdominal pain and no shortness of breath. Nothing aggravates the symptoms. Nothing relieves the symptoms. She has tried nothing for the symptoms. The treatment provided no relief.    Past Medical History:  Diagnosis Date  . Medical history non-contributory     Patient Active Problem List   Diagnosis Date Noted  . Post-dates pregnancy 09/06/2017  . Spanish speaking patient 10/01/2016  . Appendicitis, acute, with peritonitis s/p lap appendectomy 09/29/2016 09/29/2016    Past Surgical History:  Procedure Laterality Date  . APPENDECTOMY     Nov 2017  . CESAREAN SECTION N/A 09/07/2017   Procedure: CESAREAN SECTION;  Surgeon: Mora Bellman, MD;  Location: [facility];  Service: Obstetrics;  Laterality: N/A;  . LAPAROSCOPIC APPENDECTOMY N/A 09/29/2016   Procedure: APPENDECTOMY LAPAROSCOPIC;  Surgeon: Greer Pickerel, MD;  Location: [facility];  Service: General;  Laterality: N/A;  . NO PAST SURGERIES       OB History    Gravida  2   Para  1   Term  1   Preterm  0   AB  1   Living  1     SAB  1   TAB  0   Ectopic  0   Multiple  0   Live Births  1            Home Medications    Prior to Admission medications   Medication Sig Start Date End Date Taking? Authorizing Provider  acetaminophen (TYLENOL) 325 MG tablet Take 650 mg by mouth every 6 (six) hours as needed for headache.     [provider]  ibuprofen (ADVIL,MOTRIN) 600 MG tablet Take 1 tablet (600 mg total) by mouth every 6 (six) hours as needed. 09/18/18   Manya Silvas, CNM    Family History Family History  Problem Relation Age of Onset  . Asthma Mother     Social History Social History   Tobacco Use  . Smoking status: Current Every Day Smoker    Packs/day: 0.25    Years: 10.00    Pack years: 2.50    Types: Cigars  . Smokeless tobacco: Never Used  Substance Use Topics  . Alcohol use: No    Alcohol/week: 0.0 standard drinks  . Drug use: No     Allergies   Patient has no known allergies.   Review of Systems Review of Systems  Constitutional: Negative for chills and fever.  HENT: Negative for congestion and rhinorrhea.   Eyes: Negative for redness and visual disturbance.  Respiratory: Negative for shortness of breath and wheezing.   Cardiovascular: Negative for chest pain and palpitations.  Gastrointestinal: Negative for abdominal pain, nausea and vomiting.  Genitourinary: Negative for dysuria and urgency.  Musculoskeletal: Negative for arthralgias and myalgias.  Skin: Negative for pallor and wound.  Neurological: Positive for headaches. Negative for dizziness.     Physical Exam Updated Vital Signs BP 116/77   Pulse 65   Temp 97.9 F (36.6 C) (Oral)   Resp 18   Ht 5\' 4"  (1.626 m)   Wt 71.2 kg   LMP  (LMP Unknown)   SpO2 100%   BMI 26.95 kg/m   Physical Exam  Constitutional: She is oriented to person, place, and time. She appears well-developed and well-nourished. No distress.  HENT:  Head: Normocephalic and atraumatic.  Eyes: Pupils are  equal, round, and reactive to light. EOM are normal.  Neck: Normal range of motion. Neck supple.  Cardiovascular: Normal rate and regular rhythm. Exam reveals no gallop and no friction rub.  No murmur heard. Pulmonary/Chest: Effort normal. She has no wheezes. She has no rales.  Abdominal: Soft. She exhibits no distension. There is no tenderness.  Musculoskeletal: She exhibits no edema or tenderness.  Neurological: She is alert and oriented to person, place, and time. She has normal strength. No cranial nerve deficit or sensory deficit. She displays a negative Romberg sign. Coordination and gait normal. GCS eye subscore is 4. GCS verbal subscore is 5. GCS motor subscore is 6.  Benign neuro exam  Skin: Skin is warm and dry. She is not diaphoretic.  Psychiatric: She has a normal mood and affect. Her behavior is normal.  Nursing note and vitals reviewed.    ED Treatments / Results  Labs (all labs ordered are listed, but only abnormal results are displayed) Labs Reviewed  COMPREHENSIVE METABOLIC PANEL - Abnormal; Notable for the following components:      Result Value   Glucose, Bld 106 (*)    ALT 62 (*)    All other components within normal limits  CBC  I-STAT BETA HCG BLOOD, ED ([facility], [facility], [facility] ONLY)    EKG None  Radiology Ct Head Wo Contrast  Result Date: 09/22/2018 CLINICAL DATA:  Migraine headaches and blurry vision for several days EXAM: CT HEAD WITHOUT CONTRAST TECHNIQUE: Contiguous axial images were obtained from the base of the skull through the vertex without intravenous contrast. COMPARISON:  None. FINDINGS: Brain: No evidence of acute infarction, hemorrhage, hydrocephalus, extra-axial collection or mass lesion/mass effect. Vascular: No hyperdense vessel or unexpected calcification. Skull: Normal. Negative for fracture or focal lesion. Sinuses/Orbits: No acute finding. Other: None. IMPRESSION: Normal head CT Electronically Signed   By: Inez Catalina M.D.   On: 09/22/2018 21:25     Procedures Procedures (including critical care time)  Medications Ordered in ED Medications  prochlorperazine (COMPAZINE) injection 10 mg (10 mg Intramuscular Given 09/22/18 2251)  diphenhydrAMINE (BENADRYL) injection 25 mg (25 mg Intramuscular Given 09/22/18 2250)  dexamethasone (DECADRON) tablet 10 mg (10 mg Oral Given 09/22/18 2249)     Initial Impression / Assessment and Plan / ED Course  I have reviewed the triage vital signs and the nursing notes.  Pertinent labs & imaging results that were available during my care of the patient were reviewed by me and considered in my medical decision making (see chart for details).     37 yo F with a chief complaint of a headache.  Going on for the past week.  Off and  on.  Patient is seen an ophthalmologist who thought this is may be migrainous.  Patient had a CT scan of the head that was ordered from triage that was negative for acute intracranial pathology.  She has a benign neurologic exam.  We will give a headache cocktail here.  Given neurology follow-up.  Headache improved on reassessment.    11:34 PM:  I have discussed the diagnosis/risks/treatment options with the patient and believe the pt to be eligible for discharge home to follow-up with PCP, neuro. We also discussed returning to the ED immediately if new or worsening sx occur. We discussed the sx which are most concerning (e.g., sudden worsening pain, fever, inability to tolerate by mouth) that necessitate immediate return. Medications administered to the patient during their visit and any new prescriptions provided to the patient are listed below.  Medications given during this visit Medications  prochlorperazine (COMPAZINE) injection 10 mg (10 mg Intramuscular Given 09/22/18 2251)  diphenhydrAMINE (BENADRYL) injection 25 mg (25 mg Intramuscular Given 09/22/18 2250)  dexamethasone (DECADRON) tablet 10 mg (10 mg Oral Given 09/22/18 2249)      The patient appears reasonably  screen and/or stabilized for discharge and I doubt any other medical condition or other [facility] requiring further screening, evaluation, or treatment in the ED at this time prior to discharge.    Final Clinical Impressions(s) / ED Diagnoses   Final diagnoses:  Bad headache    ED Discharge Orders         Ordered    Ambulatory referral to Neurology    Comments:  Headache syndrome   09/22/18 [place], [place], DO 09/22/18 2334

## 2018-09-22 NOTE — ED Triage Notes (Signed)
Pt reports migraine presents since Sunday. States she was seem at [facility] for same. Also reports blurred vision and nausea when pain is severe.

## 2018-09-22 NOTE — ED Notes (Signed)
ED Provider at bedside. 

## 2018-09-22 NOTE — Discharge Instructions (Signed)
You should get a call from the neurologist office to make an appointment.  Everyone needs a family doctor.  If you do not have one you can call the hotline number to get one.  Return to the ED for sudden worsening pain, fever.   Do not take medicine to prevent a headache, this may cause you to get a headache.   Debera recibir una llamada de la oficina del neurlogo para programar una cita. Todos necesitan un mdico de [place]. Si no tiene uno, puede llamar al nmero de la lnea directa para obtener uno. Regrese al servicio de urgencias para empeorar repentinamente el dolor, fiebre.  No tome medicamentos para prevenir el dolor de [place], esto puede causarle dolor de cabeza.

## 2018-09-28 ENCOUNTER — Ambulatory Visit (HOSPITAL_COMMUNITY)
Admission: EM | Admit: 2018-09-28 | Discharge: 2018-09-28 | Disposition: A | Discharge status: Home / Self Care | Payer: Self-pay | Attending: Family Medicine | Admitting: Family Medicine

## 2018-09-28 ENCOUNTER — Encounter (HOSPITAL_COMMUNITY): Payer: Self-pay

## 2018-09-28 DIAGNOSIS — G43009 Migraine without aura, not intractable, without status migrainosus: Secondary | ICD-10-CM

## 2018-09-28 MED ORDER — TOPIRAMATE 50 MG PO TABS: 50.0000 mg | ORAL_TABLET | Freq: Every day | ORAL | 4 refills | Status: DC

## 2018-09-28 NOTE — ED Provider Notes (Signed)
[facility]    CSN: [id number] Arrival date & time: 09/28/18  1547     History   Chief Complaint Chief Complaint  Patient presents with  . Migraine    HPI Meghan Reeves is a 37 y.o. female.   This is the first Meghan Reeves urgent care visit for this 37 year old woman complaining of frontal headache.  She has been having headache for 2 weeks, intermittently lasting 3 days and then subsiding for couple days only to return.  She is had a little bit of nausea.  She also has some back pain, and is taking Advil for both of these problems.  The Advil only partially controls the headache.  Patient has an implant for contraception in her left arm.  Patient's had no stiff neck, fever, or vomiting.  She has not had significant headaches in the past.  She is able to sleep despite the headache.  She has had no visual disturbance.  Patient is a homemaker.     Past Medical History:  Diagnosis Date  . Medical history non-contributory     Patient Active Problem List   Diagnosis Date Noted  . Post-dates pregnancy 09/06/2017  . Spanish speaking patient 10/01/2016  . Appendicitis, acute, with peritonitis s/p lap appendectomy 09/29/2016 09/29/2016    Past Surgical History:  Procedure Laterality Date  . APPENDECTOMY     Nov 2017  . CESAREAN SECTION N/A 09/07/2017   Procedure: CESAREAN SECTION;  Surgeon: Mora Bellman, MD;  Location: [facility];  Service: Obstetrics;  Laterality: N/A;  . LAPAROSCOPIC APPENDECTOMY N/A 09/29/2016   Procedure: APPENDECTOMY LAPAROSCOPIC;  Surgeon: Greer Pickerel, MD;  Location: [facility];  Service: General;  Laterality: N/A;  . NO PAST SURGERIES      OB History    Gravida  2   Para  1   Term  1   Preterm  0   AB  1   Living  1     SAB  1   TAB  0   Ectopic  0   Multiple  0   Live Births  1            Home Medications    Prior to Admission medications   Medication Sig Start Date End Date Taking? Authorizing  Provider  acetaminophen (TYLENOL) 325 MG tablet Take 650 mg by mouth every 6 (six) hours as needed for headache.     [provider]  ibuprofen (ADVIL,MOTRIN) 600 MG tablet Take 1 tablet (600 mg total) by mouth every 6 (six) hours as needed. 09/18/18   Tamala Julian, [place], CNM  topiramate (TOPAMAX) 50 MG tablet Take 1 tablet (50 mg total) by mouth at bedtime. 09/28/18   [name], MD    Family History Family History  Problem Relation Age of Onset  . Asthma Mother     Social History Social History   Tobacco Use  . Smoking status: Current Every Day Smoker    Packs/day: 0.25    Years: 10.00    Pack years: 2.50    Types: Cigars  . Smokeless tobacco: Never Used  Substance Use Topics  . Alcohol use: No    Alcohol/week: 0.0 standard drinks  . Drug use: No     Allergies   Patient has no known allergies.   Review of Systems Review of Systems  Musculoskeletal: Positive for back pain.  Neurological: Positive for headaches.  All other systems reviewed and are negative.    Physical Exam Triage Vital Signs  ED Triage Vitals  Enc Vitals Group     BP 09/28/18 1645 106/75     Pulse Rate 09/28/18 1645 84     Resp 09/28/18 1645 20     Temp 09/28/18 1645 97.8 F (36.6 C)     Temp Source 09/28/18 1645 Oral     SpO2 09/28/18 1645 100 %     Weight --      Height --      Head Circumference --      Peak Flow --      Pain Score 09/28/18 1648 6     Pain Loc --      Pain Edu? --      Excl. in [place]? --    No data found.  Updated Vital Signs BP 106/75 (BP Location: Right Arm)   Pulse 84   Temp 97.8 F (36.6 C) (Oral)   Resp 20   LMP  (LMP Unknown)   SpO2 100%    Physical Exam  Constitutional: She is oriented to person, place, and time. She appears well-developed and well-nourished.  HENT:  Head: Normocephalic.  Right Ear: External ear normal.  Left Ear: External ear normal.  Mouth/Throat: Oropharynx is clear and moist.  Eyes: Pupils are equal, round, and  reactive to light. Conjunctivae are normal.  Normal fundi  Neck: Normal range of motion. Neck supple. No tracheal deviation present. No thyromegaly present.  Cardiovascular: Normal rate, regular rhythm and normal heart sounds.  Pulmonary/Chest: Effort normal and breath sounds normal.  Musculoskeletal: Normal range of motion.  Lymphadenopathy:    She has no cervical adenopathy.  Neurological: She is alert and oriented to person, place, and time. No cranial nerve deficit. Coordination normal.  Skin: Skin is warm and dry.  Psychiatric: She has a normal mood and affect.  Nursing note and vitals reviewed.    UC Treatments / Results  Labs (all labs ordered are listed, but only abnormal results are displayed) Labs Reviewed - No data to display  EKG None  Radiology No results found.  Procedures Procedures (including critical care time)  Medications Ordered in UC Medications - No data to display  Initial Impression / Assessment and Plan / UC Course  I have reviewed the triage vital signs and the nursing notes.  Pertinent labs & imaging results that were available during my care of the patient were reviewed by me and considered in my medical decision making (see chart for details).    Final Clinical Impressions(s) / UC Diagnoses   Final diagnoses:  Migraine without aura and without status migrainosus, not intractable   Discharge Instructions   None    ED Prescriptions    Medication Sig Dispense Auth. Provider   topiramate (TOPAMAX) 50 MG tablet Take 1 tablet (50 mg total) by mouth at bedtime. 30 tablet [name], MD     Controlled Substance Prescriptions [place] Controlled Substance Registry consulted? Not Applicable   [name], MD 09/28/18 1723

## 2018-09-28 NOTE — ED Triage Notes (Signed)
Pt presents with ongoing migraine with no relief.

## 2018-09-30 ENCOUNTER — Ambulatory Visit (HOSPITAL_COMMUNITY): Admission: EM | Admit: 2018-09-30 | Discharge: 2018-09-30 | Discharge status: Against Medical Advice | Payer: Self-pay

## 2018-09-30 NOTE — ED Notes (Signed)
Patient had a question about medication prescribed.  Dr Joseph Art answered question and patient left.  Patient access reported patient had called earlier, but no one spoke spanish.  Essentially a phone visit.

## 2018-10-07 ENCOUNTER — Ambulatory Visit (HOSPITAL_COMMUNITY)
Admission: EM | Admit: 2018-10-07 | Discharge: 2018-10-07 | Disposition: A | Discharge status: Home / Self Care | Payer: Self-pay | Attending: Family Medicine | Admitting: Family Medicine

## 2018-10-07 ENCOUNTER — Encounter (HOSPITAL_COMMUNITY): Payer: Self-pay

## 2018-10-07 DIAGNOSIS — G43009 Migraine without aura, not intractable, without status migrainosus: Secondary | ICD-10-CM

## 2018-10-07 MED ORDER — IBUPROFEN 800 MG PO TABS
ORAL_TABLET | ORAL | Status: AC
  Filled 2018-10-07: qty 1

## 2018-10-07 MED ORDER — IBUPROFEN 800 MG PO TABS
800.0000 mg | ORAL_TABLET | Freq: Once | ORAL | Status: AC
  Administered 2018-10-07: 800 mg via ORAL

## 2018-10-07 NOTE — Discharge Instructions (Addendum)
Take the ibuprofen for headache See a PCP for onging medical care See a neurologist for the headaches

## 2018-10-07 NOTE — ED Provider Notes (Signed)
[facility]    CSN: [id number] Arrival date & time: 10/07/18  1739     History   Chief Complaint Chief Complaint  Patient presents with  . Headache    HPI Meghan Reeves is a 37 y.o. female.   HPI  Patient is here for headaches.  She is been having lots of headaches for the last couple of months.  She has had 3 ER visits into urgent care visits for the same.  Nothing is really helping her very much.  She states she got the best relief when she was taking Advil migraine on a daily basis.  When she was seen last time the doctor tried her on Topamax for prevention.  She was taking 100 mg at bedtime.  She states it makes her anxious and she is not comfortable taking this.  This doctor told her not to take the Advil while she is on the Topamax.  She is here to ask what to do next. I explained to her that the ER in the urgent care center just take care of temporary problems" ongoing management of medical problems.  She has an ongoing medical condition with her migraine.  I recommend she see a PCP.  I recommend she think about referral to neurology.  She is seen today with a Spanish interpreter. Her headache today is a typical headache.  Behind her eyes.  Mild photophobia.  No nausea or vomiting.  No head injury, no cold symptoms, no fever, no worrisome symptoms or focal neuro findings  Past Medical History:  Diagnosis Date  . Medical history non-contributory     Patient Active Problem List   Diagnosis Date Noted  . Post-dates pregnancy 09/06/2017  . Spanish speaking patient 10/01/2016  . Appendicitis, acute, with peritonitis s/p lap appendectomy 09/29/2016 09/29/2016    Past Surgical History:  Procedure Laterality Date  . APPENDECTOMY     Nov 2017  . CESAREAN SECTION N/A 09/07/2017   Procedure: CESAREAN SECTION;  Surgeon: Mora Bellman, MD;  Location: [facility];  Service: Obstetrics;  Laterality: N/A;  . LAPAROSCOPIC APPENDECTOMY N/A 09/29/2016   Procedure: APPENDECTOMY LAPAROSCOPIC;  Surgeon: Greer Pickerel, MD;  [facility];  Service: General;  Laterality: N/A;  . NO PAST SURGERIES      OB History    Gravida  2   Para  1   Term  1   Preterm  0   AB  1   Living  1     SAB  1   TAB  0   Ectopic  0   Multiple  0   Live Births  1            Home Medications    Prior to Admission medications   Medication Sig Start Date End Date Taking? Authorizing Provider  acetaminophen (TYLENOL) 325 MG tablet Take 650 mg by mouth every 6 (six) hours as needed for headache.     [provider]  ibuprofen (ADVIL,MOTRIN) 600 MG tablet Take 1 tablet (600 mg total) by mouth every 6 (six) hours as needed. 09/18/18   Tamala Julian, [place], CNM  topiramate (TOPAMAX) 50 MG tablet Take 1 tablet (50 mg total) by mouth at bedtime. 09/28/18   [name], MD    Family History Family History  Problem Relation Age of Onset  . Asthma Mother     Social History Social History   Tobacco Use  . Smoking status: Current Every Day Smoker    Packs/day: 0.25  Years: 10.00    Pack years: 2.50    Types: Cigars  . Smokeless tobacco: Never Used  Substance Use Topics  . Alcohol use: No    Alcohol/week: 0.0 standard drinks  . Drug use: No     Allergies   Patient has no known allergies.   Review of Systems Review of Systems  Constitutional: Negative for chills and fever.  HENT: Negative for ear pain and sore throat.   Eyes: Positive for photophobia. Negative for pain and visual disturbance.  Respiratory: Negative for cough and shortness of breath.   Cardiovascular: Negative for chest pain and palpitations.  Gastrointestinal: Negative for abdominal pain and vomiting.  Genitourinary: Negative for dysuria and hematuria.  Musculoskeletal: Negative for arthralgias and back pain.  Skin: Negative for color change and rash.  Neurological: Positive for headaches. Negative for seizures and syncope.  All other systems  reviewed and are negative.    Physical Exam Triage Vital Signs ED Triage Vitals  Enc Vitals Group     BP 10/07/18 1816 106/71     Pulse Rate 10/07/18 1816 89     Resp 10/07/18 1816 20     Temp 10/07/18 1816 97.9 F (36.6 C)     Temp Source 10/07/18 1816 Oral     SpO2 10/07/18 1816 100 %     Weight --      Height --      Head Circumference --      Peak Flow --      Pain Score 10/07/18 1815 7     Pain Loc --      Pain Edu? --      Excl. in [place]? --    No data found.  Updated Vital Signs BP 106/71 (BP Location: Right Arm)   Pulse 89   Temp 97.9 F (36.6 C) (Oral)   Resp 20   LMP  (LMP Unknown)   SpO2 100%   Visual Acuity Right Eye Distance:   Left Eye Distance:   Bilateral Distance:    Right Eye Near:   Left Eye Near:    Bilateral Near:     Physical Exam  Constitutional: She appears well-developed and well-nourished. No distress.  HENT:  Head: Normocephalic and atraumatic.  Mouth/Throat: Oropharynx is clear and moist.  Eyes: Pupils are equal, round, and reactive to light. Conjunctivae are normal. Right eye exhibits normal extraocular motion and no nystagmus. Left eye exhibits normal extraocular motion and no nystagmus. Right pupil is reactive. Left pupil is reactive.  Neck: Normal range of motion.  Cardiovascular: Normal rate, regular rhythm and normal heart sounds.  Pulmonary/Chest: Effort normal and breath sounds normal. No respiratory distress.  Abdominal: Soft. She exhibits no distension.  Musculoskeletal: Normal range of motion. She exhibits no edema.  Lymphadenopathy:    She has no cervical adenopathy.  Neurological: She is alert. She has normal strength. She displays normal reflexes. No cranial nerve deficit. Coordination and gait normal.  Skin: Skin is warm and dry.  Psychiatric: She has a normal mood and affect. Her behavior is normal.     UC Treatments / Results  Labs (all labs ordered are listed, but only abnormal results are displayed) Labs  Reviewed - No data to display  EKG None  Radiology No results found.  Procedures Procedures (including critical care time)  Medications Ordered in UC Medications  ibuprofen (ADVIL,MOTRIN) tablet 800 mg (has no administration in time range)    Initial Impression / Assessment and Plan / UC Course  I have reviewed the triage vital signs and the nursing notes.  Pertinent labs & imaging results that were available during my care of the patient were reviewed by me and considered in my medical decision making (see chart for details).     See HPI Final Clinical Impressions(s) / UC Diagnoses   Final diagnoses:  Migraine without aura and without status migrainosus, not intractable     Discharge Instructions     Take the ibuprofen for headache See a PCP for onging medical care See a neurologist for the headaches    ED Prescriptions    None     Controlled Substance Prescriptions [place] Controlled Substance Registry consulted? Not Applicable   Raylene Everts, MD 10/07/18 Drema Halon

## 2018-10-07 NOTE — ED Triage Notes (Signed)
Pt presents with ongoing chronic headache with sensitivty to light; pt states medication prescribed are not helping.  Pt states she is feeling tingling in mouth and fingers and experiencing anxiety.

## 2018-10-18 ENCOUNTER — Ambulatory Visit (INDEPENDENT_AMBULATORY_CARE_PROVIDER_SITE_OTHER): Payer: Self-pay | Admitting: Family Medicine

## 2018-10-18 VITALS — BP 115/81 | HR 83 | Resp 17 | Ht 61.0 in | Wt 156.2 lb

## 2018-10-18 DIAGNOSIS — J4520 Mild intermittent asthma, uncomplicated: Secondary | ICD-10-CM

## 2018-10-18 DIAGNOSIS — R519 Headache, unspecified: Secondary | ICD-10-CM

## 2018-10-18 DIAGNOSIS — Z7689 Persons encountering health services in other specified circumstances: Secondary | ICD-10-CM

## 2018-10-18 DIAGNOSIS — R51 Headache: Secondary | ICD-10-CM

## 2018-10-18 MED ORDER — BUTALBITAL-APAP-CAFFEINE 50-300-40 MG PO CAPS: 1.0000 | ORAL_CAPSULE | Freq: Four times a day (QID) | ORAL | 1 refills | Status: DC | PRN

## 2018-10-18 MED ORDER — ALBUTEROL SULFATE HFA 108 (90 BASE) MCG/ACT IN AERS
2.0000 | INHALATION_SPRAY | Freq: Four times a day (QID) | RESPIRATORY_TRACT | 0 refills | Status: DC | PRN
Start: 2018-10-18 — End: 2019-01-05

## 2018-10-18 NOTE — Patient Instructions (Addendum)
Thank you for choosing Primary Care at [facility] to be your medical home!    Meghan Reeves was seen by Molli Barrows, FNP today.   Meghan Reeves's primary care provider is Scot Jun, FNP.   For the best care possible, you should try to see Molli Barrows, FNP-C whenever you come to the clinic.   We look forward to seeing you again soon!  If you have any questions about your visit today, please call us at [phone] or feel free to reach your primary care provider via [facility].       Cefalea tensional (Tension Headache) Una cefalea tensional es un dolor o presin que se siente en la frente y los lados de la cabeza. Estas cefaleas pueden durar de 24minutos a varios das. CUIDADOS EN EL HOGAR Control del [employer] de venta libre y los recetados solamente como se lo haya indicado el mdico.  Cuando sienta dolor de cabeza acustese en un cuarto oscuro y tranquilo.  Si se lo indican, aplquese hielo en la zona de la cabeza y el cuello: ? Ponga el hielo en una bolsa plstica. ? Coloque una [employer] piel y la bolsa de hielo. ? Coloque el hielo durante 92minutos, 2 a 3veces por da.  Utilice una almohadilla trmica o tome una ducha caliente para aplicar calor en la zona de la cabeza y el cuello segn las indicaciones del mdico. Comida y bebida  Mantenga un horario para las comidas.  No beba mucho alcohol.  No consuma mucha cafena ni deje de consumirla por completo. Instrucciones generales  Concurra a todas las visitas de control como se lo haya indicado el mdico. Esto es importante.  Lleve un registro diario para Neurosurgeon si ciertas cosas provocan los dolores de [place]. Por ejemplo, escriba los siguientes datos: ? Lo que usted come y bebe. ? Cunto tiempo duerme. ? Algn cambio en su dieta o en los medicamentos.  Pruebe recibir Engineer, production o hacer otras cosas que lo ayuden a Nurse, children's.  Disminuya el nivel de  estrs.  Sintese con la espalda recta. No contraiga (tensione) los msculos.  No consuma productos que contengan tabaco. Estos incluyen cigarrillos, tabaco para mascar y Psychologist, sport and exercise. Si necesita ayuda para dejar de fumar, consulte al mdico.  Haga ejercicios con regularidad tal como se lo indic el mdico.  Duerma lo suficiente. Es Software engineer, entre 7 y 9horas de sueo. SOLICITE AYUDA SI:  Los medicamentos no logran [employer].  Tiene un dolor de cabeza que es diferente del dolor de cabeza habitual.  Tiene Tree surgeon estomacal (nuseas) o vomita.  Tiene fiebre. SOLICITE AYUDA DE INMEDIATO SI:  La cefalea es muy intensa.  Sigue vomitando.  Presenta rigidez en el cuello.  Tiene dificultad para ver.  Tiene dificultad para hablar.  Siente dolor en el ojo o en el odo.  Sus msculos estn dbiles, o pierde el control muscular.  Pierde el equilibrio o tiene problemas para Writer.  Siente que se desvanece (pierde el conocimiento) o se desmaya.  Se siente confundido. Esta informacin no tiene Marine scientist el consejo del mdico. Asegrese de hacerle al mdico cualquier pregunta que tenga. Document Released: 01/18/2012 Document Revised: 07/17/2015 Document Reviewed: 02/18/2015 Elsevier Interactive Patient Education  2018 [employer].    Albuterol inhalation aerosol O que  este medicamento? O SALBUTAMOL  um broncodilatador. Ajuda a dilatar as vias areas pulmonares, facilitando a respirao. Este medicamento  usado para tratar e prevenir o broncoespasmo. Este medicamento  pode ser usado para outros propsitos; em caso de dvidas, pergunte ao seu profissional de sade ou farmacutico. NOMES DE MARCAS COMUNS: Proair HFA, Proventil, Proventil HFA, Respirol, Ventolin, Ventolin HFA O que devo dizer a meu profissional de sade antes de tomar este medicamento? Precisam saber se voc tem algum dos seguintes problemas ou estados de  sade: -diabetes -doenas ou problemas cardacos -batimento cardaco irregular -presso alta -feocromocitoma -convulses -doenas da tiroide -reao estranha ou alergia ao salbutamol, ao levossalbutamol ou aos sulfitos -reao estranha ou alergia a outros medicamentos, alimentos, corantes ou conservantes -est grvida ou tentando Hospital doctor -est amamentando Como devo usar este medicamento? Este medicamento  inalado pela boca. Siga as instrues na embalagem ou na bula. Tome este medicamento em intervalos regulares. No use com frequncia maior que a indicada. Certifique-se de que est usando o inalador corretamente. Em caso de dvida, fale com seu mdico ou profissional de sade. Fale com seu pediatra a respeito do uso deste medicamento em crianas. Pode ser preciso tomar alguns cuidados especiais. Superdosagem: Se achar que tomou uma superdosagem deste medicamento, entre em contato imediatamente com o [facility] ou v a [employer]. OBSERVAO: Este medicamento  s para voc. No compartilhe este medicamento com outras pessoas. E se eu deixar de tomar uma dose? Se perder uma dose, use o produto assim que possvel. Se j estiver quase na hora da sua prxima dose, use somente essa dose. No use o produto em dobro, nem use uma dose extra. O que pode interagir com este medicamento? -anti-infecciosos, como cloroquina ou pentamidina -cafena -cisaprida -diurticos -medicamentos para resfriado -medicamentos para depresso ou para distrbios emocionais ou psicticos -medicamentos para perda de peso, inclusive alguns fitoterpicos -metadona -alguns antibiticos, Land, eritromicina, levofloxacino e linezolida -alguns medicamentos para o corao -hormnios esteroides, como dexametasona, cortisona, hidrocortisona -teofilina -hormnios tiroidianos Esta lista pode no descrever todas as interaes possveis. D ao seu profissional de sade uma lista  de todos os medicamentos, ervas medicinais, remdios de venda livre, ou suplementos alimentares que voc [place]. Diga tambm se voc fuma, bebe, ou [place] drogas ilcitas. Alguns destes podem interagir com o seu medicamento. Ao que devo ficar atento quando estiver [employer] medicamento? Avise seu mdico ou profissional de sade se os seus sintomas no melhorarem. No tome salbutamol a mais. Se a sua asma ou a sua bronquite piorarem enquanto estiver [employer], entre em contato com seu mdico ou profissional de sade imediatamente. Em caso de secura na boca, experimente mascar chicletes sem acar ou chupar balas. Beba gua conforme indicado. Que efeitos colaterais posso sentir aps usar este medicamento? Efeitos colaterais que devem ser informados ao seu mdico ou profissional de sade o mais rpido possvel: -reaes alrgicas, como erupo na pele, coceira, urticria, ou inchao do rosto, dos lbios ou da lngua -dificuldade para Ambulance person -dor no peito -sensao de tontura, [place], quedas -presso alta -batimento cardaco irregular -febre -cibras ou fraqueza muscular -dor, dormncia ou formigamento nas mos ou ps -vmitos Efeitos colaterais que normalmente no precisam de cuidados mdicos (avise ao seu mdico ou profissional de sade se persistirem ou forem incmodos): -tosse -dificuldade para dormir -dor de cabea -nervosismo ou tremores -desarranjo estomacal -nariz entupido ou coriza -irritao da garganta -gosto estranho na boca Esta lista pode no descrever todos os efeitos colaterais possveis. Para mais orientaes sobre efeitos colaterais, consulte o seu mdico. Voc pode relatar a ocorrncia de efeitos colaterais  FDA pelo telefone [phone]. Onde devo guardar meu medicamento? [facility] fora do [employer].  Conservar em temperatura ambiente, entre 15 e 30 degreesC [phone] e 86 degreesF). O contedo est sob presso e pode explodir quando exposto ao calor ou  chamas. No congelar. Se estiver muito frio, a ao deste medicamento fica diminuda. Descartar qualquer medicamento no utilizado aps a data de validade impressa no rtulo ou embalagem. Descartar o inalador depois de usar o nmero marcado de inalaes ou aps a data de validade, o que ocorrer primeiro. O Ventolin HFA deve ser jogado fora 12 meses aps ser removido do Conservation officer, historic buildings. Consulte as instrues que [employer]. OBSERVAO: Este folheto  um resumo. Pode no cobrir todas as informaes possveis. Se tiver dvidas a respeito deste medicamento, fale com seu mdico, farmacutico ou profissional de sade.  2018 Elsevier/Gold Standard (2016-11-26 00:00:00)

## 2018-10-18 NOTE — Progress Notes (Signed)
Meghan Reeves, is a 37 y.o. female  YHC:[id number]  DVV:[id number]  DOB - [birth year]  CC:  Chief Complaint  Patient presents with  . Establish Care  . Follow-up    urgent care 11/29: migraine w/o aura       HPI: Meghan Reeves is a 37 y.o. female is here today to establish care.   Meghan Reeves has Appendicitis, acute, with peritonitis s/p lap appendectomy 09/29/2016; Spanish speaking patient; and Post-dates pregnancy on their problem list.    Today's visit:  Meghan Reeves is here today to establish care and for an ER follow-up. Patient has been seen multiple times over the last 6 weeks at the ER for acute headaches thought to be migraines. Since her visit she has been taking a combination pseudoephedrine and antihistamine preparation and feels headache has improved. She is taking medication daily which she purchased from a latino store. She reports headaches occur almost daily. The intensity of headache pain varies.  Reports pain is occasionally localized to left side of head, however occasional is like a "tension" sensation across bilateral head. No formal diagnosis of migraines. Tried topamax didn't feel medication worked, discontinued. Endorses nausea and light sensitivity when HA occur. No history of elevated BP or diabetes. Denies chest pain, abdominal pain, nausea, new weakness , numbness or tingling, SOB, edema, or worrisome cough.   Current medications: Current Outpatient Medications:  .  ibuprofen (ADVIL,MOTRIN) 600 MG tablet, Take 1 tablet (600 mg total) by mouth every 6 (six) hours as needed., Disp: 30 tablet, Rfl: 2   Pertinent family medical history: family history includes Asthma in her mother.   Allergies  Allergen Reactions  . Topamax [Topiramate] Other (See Comments)    Blurry vision, numbness of lips    Social History   Socioeconomic History  . Marital status: Significant Other    Spouse name: Not on file  . Number of children: Not on file  . Years of  education: Not on file  . Highest education level: Not on file  Occupational History  . Not on file  Social Needs  . Financial resource strain: Not on file  . Food insecurity:    Worry: Not on file    Inability: Not on file  . Transportation needs:    Medical: Not on file    Non-medical: Not on file  Tobacco Use  . Smoking status: Current Every Day Smoker    Packs/day: 0.25    Years: 10.00    Pack years: 2.50    Types: Cigars  . Smokeless tobacco: Never Used  Substance and Sexual Activity  . Alcohol use: No    Alcohol/week: 0.0 standard drinks  . Drug use: No  . Sexual activity: Yes    Birth control/protection: None  Lifestyle  . Physical activity:    Days per week: Not on file    Minutes per session: Not on file  . Stress: Not on file  Relationships  . Social connections:    Talks on phone: Not on file    Gets together: Not on file    Attends religious service: Not on file    Active member of club or organization: Not on file    Attends meetings of clubs or organizations: Not on file    Relationship status: Not on file  . Intimate partner violence:    Fear of current or ex partner: Not on file    Emotionally abused: Not on file    Physically abused: Not on file  Forced sexual activity: Not on file  Other Topics Concern  . Not on file  Social History Narrative  . Not on file   Review of Systems: Constitutional: Negative for fever, chills, diaphoresis, activity change, appetite change and fatigue. HENT: Negative for ear pain, nosebleeds. Positive nasal congestion. Negative  facial swelling, rhinorrhea, neck pain, neck stiffness and ear discharge.  Eyes: Negative for pain, discharge, redness, itching and visual disturbance. Respiratory: Negative for cough, choking, chest tightness, shortness of breath, wheezing and stridor.  Cardiovascular: Negative for chest pain, palpitations and leg swelling. Musculoskeletal: Negative for back pain, joint swelling, arthralgia  and gait problem. Neurological: Negative for dizziness, tremors, seizures, syncope, facial asymmetry, speech difficulty, weakness, light-headedness, numbness and headaches.  Hematological: Negative for adenopathy. Does not bruise/bleed easily. Psychiatric/Behavioral: Negative for hallucinations, behavioral problems, confusion, dysphoric mood, decreased concentration and agitation.  Objective:   Vitals:   10/18/18 1449  BP: 115/81  Pulse: 83  Resp: 17  SpO2: 97%    BP Readings from Last 3 Encounters:  10/18/18 115/81  10/07/18 106/71  09/28/18 106/75    Filed Weights   10/18/18 1449  Weight: 156 lb 3.2 oz (70.9 kg)      Physical Exam: Constitutional: Patient appears well-developed and well-nourished. No distress. HENT: Normocephalic, atraumatic, External right and left ear normal. Oropharynx is clear and moist.  Eyes: Conjunctivae and EOM are normal. PERRLA, no scleral icterus. Neck: Normal ROM. Neck supple. No JVD. No tracheal deviation. No thyromegaly. CVS: RRR, S1/S2 +, no murmurs, no gallops, no carotid bruit.  Pulmonary: Effort and breath sounds normal, no stridor, rhonchi, wheezes, rales.  Abdominal: Soft. BS +, no distension, tenderness, rebound or guarding.  Musculoskeletal: Normal range of motion. No edema and no tenderness.  Neuro: Alert. Normal muscle tone coordination. Normal gait. BUE and BLE strength 5/5. Bilateral hand grips symmetrical.No cranial nerve deficit. Skin: Skin is warm and dry. No rash noted. Not diaphoretic. No erythema. No pallor. Psychiatric: Normal mood and affect. Behavior, judgment, thought content normal.  Lab Results (prior encounters)  Lab Results  Component Value Date   WBC 8.9 09/22/2018   HGB 14.2 09/22/2018   HCT 44.0 09/22/2018   MCV 94.4 09/22/2018   PLT 272 09/22/2018   Lab Results  Component Value Date   CREATININE 0.95 09/22/2018   BUN 11 09/22/2018   NA 136 09/22/2018   K 4.2 09/22/2018   CL 106 09/22/2018   CO2 22  09/22/2018    No results found for: HGBA1C  No results found for: CHOL, TRIG, HDL, CHOLHDL, VLDL, LDLCALC      Assessment and plan:  1. Encounter to establish care 2. Intermittent asthma without complication, unspecified asthma severity -refilled chronic albuterol inhaler prescription , asymptomatic now 3. Mixed headache, trial Fioricet 1 tablet every 4 hours as needed for headache. If no improvement, will trial sumatriptan.   Meds ordered this encounter  Medications  . albuterol (PROVENTIL HFA;VENTOLIN HFA) 108 (90 Base) MCG/ACT inhaler    Sig: Inhale 2 puffs into the lungs every 6 (six) hours as needed for wheezing or shortness of breath.    Dispense:  1 Inhaler    Refill:  0  . DISCONTD: Butalbital-APAP-Caffeine (FIORICET) 50-300-40 MG CAPS    Sig: Take 1 tablet by mouth every 6 (six) hours as needed.    Dispense:  90 capsule    Refill:  1     Return for schedule CPE  4-5 weeks out and headache f/u.   The patient was  given clear instructions to go to ER or return to medical center if symptoms don't improve, worsen or new problems develop. The patient verbalized understanding. The patient was advised  to call and obtain lab results if they haven't heard anything from out office within 7-10 business days.  Molli Barrows, FNP Primary Care at [facility] [address] [place] [address] [phone]: [phone]    This note has been created with Dragon speech recognition software and Engineer, materials. Any transcriptional errors are unintentional.

## 2018-10-19 ENCOUNTER — Telehealth: Payer: Self-pay

## 2018-10-19 MED ORDER — BUTALBITAL-APAP-CAFFEINE 50-325-40 MG PO TABS: 1.0000 | ORAL_TABLET | ORAL | 1 refills | Status: DC | PRN

## 2018-10-19 NOTE — Telephone Encounter (Signed)
[facility] doesn't carry the Fioricet as you ordered. They have the Fioricet 50-325-40 tablet.

## 2018-10-21 ENCOUNTER — Encounter: Payer: Self-pay | Admitting: Family Medicine

## 2018-10-21 ENCOUNTER — Inpatient Hospital Stay: Payer: Self-pay

## 2018-10-21 DIAGNOSIS — R519 Headache, unspecified: Secondary | ICD-10-CM | POA: Insufficient documentation

## 2018-10-21 DIAGNOSIS — R51 Headache: Secondary | ICD-10-CM

## 2018-11-17 ENCOUNTER — Ambulatory Visit: Payer: Self-pay | Admitting: Family Medicine

## 2018-11-24 ENCOUNTER — Ambulatory Visit (INDEPENDENT_AMBULATORY_CARE_PROVIDER_SITE_OTHER): Payer: Self-pay | Admitting: Family Medicine

## 2018-11-24 ENCOUNTER — Encounter: Payer: Self-pay | Admitting: Family Medicine

## 2018-11-24 VITALS — BP 107/76 | HR 74 | Resp 16 | Ht 61.0 in | Wt 158.8 lb

## 2018-11-24 DIAGNOSIS — Z3202 Encounter for pregnancy test, result negative: Secondary | ICD-10-CM

## 2018-11-24 DIAGNOSIS — Z1389 Encounter for screening for other disorder: Secondary | ICD-10-CM

## 2018-11-24 DIAGNOSIS — Z0000 Encounter for general adult medical examination without abnormal findings: Secondary | ICD-10-CM

## 2018-11-24 DIAGNOSIS — F17210 Nicotine dependence, cigarettes, uncomplicated: Secondary | ICD-10-CM

## 2018-11-24 DIAGNOSIS — Z13220 Encounter for screening for lipoid disorders: Secondary | ICD-10-CM

## 2018-11-24 DIAGNOSIS — Z23 Encounter for immunization: Secondary | ICD-10-CM

## 2018-11-24 DIAGNOSIS — Z131 Encounter for screening for diabetes mellitus: Secondary | ICD-10-CM

## 2018-11-24 DIAGNOSIS — Z1329 Encounter for screening for other suspected endocrine disorder: Secondary | ICD-10-CM

## 2018-11-24 DIAGNOSIS — F419 Anxiety disorder, unspecified: Secondary | ICD-10-CM

## 2018-11-24 DIAGNOSIS — Z130 Encounter for screening for diseases of the blood and blood-forming organs and certain disorders involving the immune mechanism: Secondary | ICD-10-CM

## 2018-11-24 LAB — POCT URINE PREGNANCY: Preg Test, Ur: NEGATIVE

## 2018-11-24 LAB — POCT URINALYSIS DIP (CLINITEK)
BILIRUBIN UA: NEGATIVE mg/dL
Bilirubin, UA: NEGATIVE
Blood, UA: NEGATIVE
GLUCOSE UA: NEGATIVE mg/dL
Leukocytes, UA: NEGATIVE
Nitrite, UA: NEGATIVE
POC PROTEIN,UA: NEGATIVE
SPEC GRAV UA: 1.02 (ref 1.010–1.025)
Urobilinogen, UA: 0.2 E.U./dL
pH, UA: 6 (ref 5.0–8.0)

## 2018-11-24 MED ORDER — IBUPROFEN 600 MG PO TABS: 600.0000 mg | ORAL_TABLET | Freq: Four times a day (QID) | ORAL | 2 refills | Status: DC | PRN

## 2018-11-24 MED ORDER — HYDROXYZINE HCL 25 MG PO TABS: 25.0000 mg | ORAL_TABLET | Freq: Three times a day (TID) | ORAL | 0 refills | Status: DC | PRN

## 2018-11-24 MED ORDER — ESCITALOPRAM OXALATE 10 MG PO TABS: 10.0000 mg | ORAL_TABLET | Freq: Every day | ORAL | 0 refills | Status: DC

## 2018-11-24 NOTE — Patient Instructions (Addendum)
[place] (Health Maintenance, Female) Un estilo de vida saludable y los cuidados preventivos pueden favorecer considerablemente a la salud y Musician. Pregunte a su mdico cul es el cronograma de exmenes peridicos apropiado para usted. Esta es una buena oportunidad para consultarlo sobre cmo prevenir enfermedades y [place] sano. Adems de los controles, hay muchas otras cosas que puede hacer usted mismo. Los expertos han realizado numerosas investigaciones [employer] cambios en el estilo de vida y las medidas de prevencin que, [place], lo ayudarn a mantenerse sano. Solicite a su mdico ms informacin. EL PESO Y LA DIETA Consuma una dieta saludable.  Asegrese de [employer] verduras, frutas, productos lcteos de bajo contenido de [place] y Advertising account planner.  No consuma muchos alimentos de alto contenido de grasas slidas, azcares agregados o sal.  Realice actividad fsica con regularidad. Esta es una de las prcticas ms importantes que puede hacer por su salud. ? La Delorise Shiner de los adultos deben hacer ejercicio durante al menos 124mnutos por semana. El ejercicio debe aumentar la frecuencia cardaca y pActorla transpiracin (ejercicio de [place]. ? La mayora de los adultos tambin deben hacer ejercicios de elongacin al mToysRusveces a la semana. Agregue esto al su plan de ejercicio de intensidad moderada. Mantenga un peso saludable.  El ndice de masa corporal ([facility] es una medida que puede utilizarse para identificar posibles problemas de pEast [place]town Proporciona una estimacin de la grasa corporal basndose en el peso y la altura. Su mdico puede ayudarle a dRadiation protection practitionerISouth Endy a lScientist, forensico mTheatre managerun peso saludable.  Para las mujeres de 20aos o ms: ? Un IJohn R. Oishei Children'S Hospitalmenor de 18,5 se considera bajo peso. ? Un ICumberland County Hospitalentre 18,5 y 24,9 es normal. ? Un IPelham Medical Centerentre 25 y 29,9 se considera sobrepeso. ? Un IMC de 30 o ms se considera  obesidad. Observe los niveles de colesterol y lpidos en la sangre.  Debe comenzar a rEnglish as a second language teacherde lpidos y cResearch officer, trade unionen la sangre a los 20aos y luego repetirlos cada 516aos  Es posible que nAutomotive engineerlos niveles de colesterol con mayor frecuencia si: ? Sus niveles de lpidos y colesterol son altos. ? Es mayor de 527CWC ? Presenta un alto riesgo de padecer enfermedades cardacas. DETECCIN DE CNCER Cncer de pulmn  Se recomienda realizar exmenes de deteccin de cncer de pulmn a personas adultas entre 574y 892aos que estn en riesgo de dHorticulturist, commercialde pulmn por sus antecedentes de consumo de tabaco.  Se recomienda una tomografa computarizada de baja dosis de los pulmones todos los aos a las personas que: ? Fuman actualmente. ? Hayan dejado el hbito en algn momento en los ltimos 15aos. ? Hayan fumado durante 30aos un paquete diario. Un paquete-ao equivale a fumar un promedio de un paquete de cigarrillos diario durante un ao.  Los exmenes de deteccin anuales deben continuar hasta que hayan pasado 15aos desde que dej de fumar.  Ya no debern realizarse si tiene un problema de salud que le impida recibir tratamiento para eScience writerde pulmn. Cncer de mama  Practique la autoconciencia de la mama. Esto significa reconocer la apariencia normal de sus mamas y cmo las siente.  Tambin significa realizar autoexmenes regulares de lJohnson & Johnson Informe a su mdico sobre cualquier cambio, sin importar cun pequeo sea.  Si tiene entre 20 y 363aos, un mdico debe realizarle un examen clnico de las mamas como parte del examen regular de s[place] cada 1 a  3aos.  Si tiene 40aos o ms, debe realizarse un examen clnico de las mamas todos los aos. Tambin considere realizarse una radiografa de las mamas (mamografa) todos los aos.  Si tiene antecedentes familiares de cncer de mama, hable con su mdico para someterse a un estudio gentico.  Si  tiene alto riesgo de padecer cncer de mama, hable con su mdico para someterse a una resonancia magntica y una mamografa todos los aos.  La evaluacin del gen del cncer de mama (BRCA) se recomienda a mujeres que tengan familiares con cnceres relacionados con el BRCA. Los cnceres relacionados con el BRCA incluyen los siguientes: ? Mama. ? Ovario. ? Trompas. ? Cnceres de peritoneo.  Los resultados de la evaluacin determinarn la necesidad de asesoramiento gentico y de anlisis de BRCA1 y BRCA2. Cncer de cuello del tero El mdico puede recomendarle que se haga pruebas peridicas de deteccin de cncer de los rganos de la pelvis (ovarios, tero y vagina). Estas pruebas incluyen un examen plvico, que abarca controlar si se produjeron cambios microscpicos en la superficie del cuello del tero (prueba de Papanicolaou). Pueden recomendarle que se haga estas pruebas cada 3aos, a partir de los 21aos.  A las mujeres que tienen entre 30 y 65aos, los mdicos pueden recomendarles que se sometan a exmenes plvicos y pruebas de Papanicolaou cada 3aos, o a la prueba de Papanicolaou y el examen plvico en combinacin con estudios de deteccin del virus del papiloma humano (VPH) cada 5aos. Algunos tipos de VPH aumentan el riesgo de padecer cncer de cuello del tero. La prueba para la deteccin del VPH tambin puede realizarse a mujeres de cualquier edad cuyos resultados de la prueba de Papanicolaou no sean claros.  Es posible que otros mdicos no recomienden exmenes de deteccin a mujeres no embarazadas que se consideran sujetos de bajo riesgo de padecer cncer de pelvis y que no tienen sntomas. Pregntele al mdico si un examen plvico de deteccin es adecuado para usted.  Si ha recibido un tratamiento para el cncer cervical o una enfermedad que podra causar cncer, necesitar realizarse una prueba de Papanicolaou y controles durante al menos 20 aos de concluido el tratamiento. Si no se  ha hecho el Papanicolaou con regularidad, debern volver a evaluarse los factores de riesgo (como tener un nuevo compaero sexual), para determinar si debe realizarse los estudios nuevamente. Algunas mujeres sufren problemas mdicos que aumentan la probabilidad de contraer cncer de cuello del tero. En estos casos, el mdico podr indicar que se realicen controles y pruebas de Papanicolaou con ms frecuencia. Cncer colorrectal  Este tipo de cncer puede detectarse y a menudo prevenirse.  Por lo general, los estudios de rutina se deben comenzar a hacer a partir de los 50 aos y hasta los 75 aos.  Sin embargo, el mdico podr aconsejarle que lo haga antes, si tiene factores de riesgo para el cncer de colon.  Tambin puede recomendarle que use un kit de prueba para hallar sangre oculta en la materia fecal.  Es posible que se use una pequea cmara en el extremo de un tubo para examinar directamente el colon (sigmoidoscopia o colonoscopia) a fin de detectar formas tempranas de cncer colorrectal.  Los exmenes de rutina generalmente comienzan a los 50aos.  El examen directo del colon se debe repetir cada 5 a 10aos hasta los 75aos. Sin embargo, es posible que se realicen exmenes con mayor frecuencia, si se detectan formas tempranas de plipos precancerosos o pequeos bultos. Cncer de piel  Revise la piel   de la cabeza a los pies con regularidad.  Informe a su mdico si aparecen nuevos lunares o los que tiene se modifican, especialmente en su forma y color.  Tambin notifique al mdico si tiene un lunar que es ms grande que el tamao de una goma de lpiz.  Siempre use pantalla solar. Aplique pantalla solar de manera libre y repetida a lo largo del da.  Protjase usando mangas y pantalones largos, un sombrero de ala ancha y gafas para el sol, siempre que se encuentre en el exterior. ENFERMEDADES CARDACAS, DIABETES E HIPERTENSIN ARTERIAL  La hipertensin arterial causa  enfermedades cardacas y aumenta el riesgo de ictus. La hipertensin arterial es ms probable en los siguientes casos: ? Las personas que tienen la presin arterial en el extremo del rango normal (100-139/85-89 mm Hg). ? Las personas con sobrepeso u obesidad. ? Las personas afroamericanas.  Si usted tiene entre 18 y 39 aos, debe medirse la presin arterial cada 3 a 5 aos. Si usted tiene 40 aos o ms, debe medirse la presin arterial todos los aos. Debe medirse la presin arterial dos veces: una vez cuando est en un hospital o una clnica y la otra vez cuando est en otro sitio. Registre el promedio de las dos mediciones. Para controlar su presin arterial cuando no est en un hospital o una clnica, puede usar lo siguiente: ? Una mquina automtica para medir la presin arterial en una farmacia. ? Un monitor para medir la presin arterial en el hogar.  Si tiene entre 55 y 79 aos, consulte a su mdico si debe tomar aspirina para prevenir el ictus.  Realcese exmenes de deteccin de la diabetes con regularidad. Esto incluye la toma de una muestra de sangre para controlar el nivel de azcar en la sangre durante el ayuno. ? Si tiene un peso normal y un bajo riesgo de padecer diabetes, realcese este anlisis cada tres aos despus de los 45aos. ? Si tiene sobrepeso y un alto riesgo de padecer diabetes, considere someterse a este anlisis antes o con mayor frecuencia. PREVENCIN DE INFECCIONES HepatitisB  Si tiene un riesgo ms alto de contraer hepatitis B, debe someterse a un examen de deteccin de este virus. Se considera que tiene un alto riesgo de contraer hepatitis B si: ? Naci en un pas donde la hepatitis B es frecuente. Pregntele a su mdico qu pases son considerados de alto riesgo. ? Sus padres nacieron en un pas de alto riesgo y usted no recibi una vacuna que lo proteja contra la hepatitis B (vacuna contra la hepatitis B). ? Tiene VIH o sida. ? Usa agujas para inyectarse  drogas. ? Vive con alguien que tiene hepatitis B. ? Ha tenido sexo con alguien que tiene hepatitis B. ? Recibe tratamiento de hemodilisis. ? Toma ciertos medicamentos para el cncer, trasplante de rganos y afecciones autoinmunitarias. Hepatitis C  Se recomienda un anlisis de sangre para: ? Todos los que nacieron entre 1945 y 1965. ? Todas las personas que tengan un riesgo de haber contrado hepatitis C. Enfermedades de transmisin sexual (ETS).  Debe realizarse pruebas de deteccin de enfermedades de transmisin sexual (ETS), incluidas gonorrea y clamidia si: ? Es sexualmente activo y es menor de 24aos. ? Es mayor de 24aos, y el mdico le informa que corre riesgo de tener este tipo de infecciones. ? La actividad sexual ha cambiado desde que le hicieron la ltima prueba de deteccin y tiene un riesgo mayor de tener clamidia o gonorrea. Pregntele al mdico si usted   tiene riesgo.  Si no tiene el VIH, pero corre riesgo de infectarse por el virus, se recomienda tomar diariamente un medicamento recetado para evitar la infeccin. Esto se conoce como profilaxis previa a la exposicin. Se considera que est en riesgo si: ? Es [place] sexualmente y no [place] preservativos habitualmente o no conoce el estado del VIH de sus Advertising copywriter. ? Se inyecta drogas. ? Es [place] sexualmente con Ardelia Mems pareja que tiene VIH. Consulte a su mdico para saber si tiene un alto riesgo de infectarse por el VIH. Si opta por comenzar la profilaxis previa a la exposicin, primero debe realizarse anlisis de deteccin del VIH. Luego, le harn anlisis cada 52mses mientras est tomando los medicamentos para la profilaxis previa a la exposicin. EH[facility] Si es premenopusica y puede quedar eRainelle solicite a su mdico asesoramiento previo a la concepcin.  Si puede quedar embarazada, tome 400 a [id number]([id number]) de cido fAnheuser-Busch  Si desea evitar el embarazo, hable con su mdico sobre el  control de la natalidad (anticoncepcin). OSTEOPOROSIS Y MENOPAUSIA  La osteoporosis es una enfermedad en la que los huesos pierden los minerales y la fuerza por el avance de la edad. El resultado pueden ser fracturas graves en los hSummerfield El riesgo de osteoporosis puede identificarse con uArdelia Memsprueba de densidad sea.  Si tiene 65aos o ms, o si est en riesgo de sufrir osteoporosis y fracturas, pregunte a su mdico si debe someterse a exmenes.  Consulte a su mdico si debe tomar un suplemento de calcio o de vitamina D para reducir el riesgo de osteoporosis.  La menopausia puede presentar ciertos sntomas fsicos y rGaffer  La terapia de reemplazo hormonal puede reducir algunos de estos sntomas y rGaffer Consulte a su mdico para saber si la terapia de reemplazo hormonal es conveniente para usted. INSTRUCCIONES PARA EL CUIDADO EN EL HOGAR  Realcese los estudios de rutina de la salud, dentales y de lPublic librarian  MChe[place]  No consuma ningn producto que contenga tabaco, lo que incluye cigarrillos, tabaco de mHigher education careers advisero cPsychologist, sport and exercise  Si est embarazada, no beba alcohol.  Si est amamantando, reduzca el consumo de alcohol y la frecuencia con la que consume.  Si es mujer y no est embarazada limite el consumo de alcohol a no ms de 1 medida por da. Una medida equivale a 12onzas de cerveza, 5onzas de vino o 1onzas de bebidas alcohlicas de alta graduacin.  No consuma drogas.  No comparta agujas.  Solicite ayuda a su mdico si necesita apoyo o informacin para abandonar las drogas.  Informe a su mdico si a menudo se siente deprimido.  Notifique a su mdico si alguna vez ha sido vctima de abuso o si no se siente seguro en su hogar. Esta informacin no tiene cMarine scientistel consejo del mdico. Asegrese de hacerle al mdico cualquier pregunta que tenga. Document Released: 10/15/2011 Document Revised: 11/16/2014 Document Reviewed:  07/30/2015 Elsevier Interactive Patient Education  2019 EHatchde ansiedade generalizada, adultos Generalized Anxiety Disorder, Adult O transtorno de ansiedade generalizada (TAG)  um distrbio de sade mental. As pessoas que sofrem dessa condio ficam constantemente ansiosas com eventos do dia a dia. Diferentemente da ansiedade normal, a ansiedade relacionada ao TAG no  disparada por um evento especfico. Alm disso, essa ansiedade no diminui ou melhora com o tempo. O TAG interfere nas funes bsicas da vida, incluindo relacionamentos, trabalho e escola. O TAG pode variar  de leve a grave. Pessoas com TAG grave podem ter episdios intensos de ansiedade com sintomas fsicos (ataques de pnico). Quais so as causas? A causa exata do TAG  desconhecida. O que aumenta o risco? Esse quadro clnico tem maior probabilidade de se desenvolver em:  Mulheres.  Pessoas com histrico familiar de transtornos de ansiedade.  Pessoas muito tmidas.  Pessoas que passam por eventos muito estressantes na vida, como a morte de [employer].  Pessoas que tm um [employer]. Quais so os sinais ou sintomas? Pessoas com TAG geralmente se preocupam em excesso com muitas coisas em suas vidas, como sua sade e sua famlia. Elas tambm podem ser excessivamente preocupadas com:  Serem boas no trabalho.  Serem pontuais.  Desastres naturais.  Amizades. Os sintomas fsicos do TAG incluem:  Fadiga.  Tenso muscular ou espasmos musculares.  Tremedeira ou sensao de fraqueza.  Tomar sustos com facilidade.  Sentir seu corao bater forte ou acelerado.  Sentir falta de ar ou como se no conseguisse respirar fundo.  Dificuldade para pegar no sono ou manter o sono profundo.  Sudorese.  Enjoo, diarreia ou sndrome do intestino irritvel.  Dores de cabea.  Dificuldade de [place] ou de [place].  Agitao.  Irritabilidade. Como esse quadro  clnico  diagnosticado? Seu mdico poder diagnosticar o TAG com base nos seus sintomas e histrico mdico. Voc tambm passar por um exame fsico. O mdico far perguntas especficas sobre seus sintomas, incluindo qual a intensidade deles, quando comearam e se surgem e passam. Seu mdico poder perguntar sobre seu uso de lcool ou drogas, incluindo medicamentos vendidos com receita. Seu mdico poder encaminh-lo a um profissional de sade mental para avaliao e tratamento adicionais. O seu mdico far um exame minucioso e poder realizar testes adicionais para descartar outras causas possveis de seus sintomas. Para ser diagnosticado com TAG, uma pessoa precisa sofrer de uma ansiedade que:  [place] fora de controle dele ou dela.  Afete diversos aspectos diferentes de sua vida, como trabalho e relacionamentos.  Cause um nvel de angstia que o torne incapaz de participar de atividades normais.  Inclua pelo menos trs sintomas fsicos do TAG, como inquietao, fadiga, dificuldade de concentrao, irritabilidade, tenso muscular ou problemas para dormir. Antes que seu mdico possa confirmar um diagnstico de TAG, esses sintomas devem estar presentes na maioria dos dias e devem durar seis meses ou [place]. Como esse quadro clnico  tratado? As seguintes terapias so geralmente usadas para tratar o TAG:  Medicamento. Medicamentos antidepressivos em geral so prescritos para o controle dirio de Advertising account planner. Medicamentos para ansiedade podem ser combinados em casos graves, especialmente quando ocorrem ataques de pnico.  Terapia pela fala (psicoterapia). Certos tipos de terapia pela fala podem ser teis no tratamento do TAG, ao proporcionarem apoio, informaes e orientao. As opes incluem: ? Terapia cognitivo-comportamental (TCC). As pessoas aprendem habilidades e tcnicas para enfrentar e aliviar a ansiedade. Aprendem a identificar pensamentos e comportamentos irreais ou negativos e a  substitu-los por conceitos positivos. ? Terapia de aceitao e compromisso (TAC). Esse tratamento ensina s pessoas como a conscincia pode ser uma forma de lidar com pensamentos e sentimentos indesejados. ? Biofeedback. Esse processo  um treinamento para que voc Building control surveyor as Actor de seu corpo (respostas fisiolgicas) por meio de tcnicas de respirao e mtodos de relaxamento. Voc trabalhar com um terapeuta enquanto mquinas so usadas para monitorar seus sintomas fsicos.  Tcnicas de controle do estresse. Incluem ioga, meditao e exerccio. Um especialista em sade mental poder determinar  qual tratamento  melhor para voc. Algumas pessoas obtm melhoras com um nico tratamento. No entanto, outras pessoas necessitam de uma combinao de tratamentos. Siga estas instrues em casa:  Tome medicamentos vendidos com ou sem receita mdica somente de acordo com as indicaes do seu mdico.  Tente manter uma rotina normal.  Tente prever as situaes estressantes e dedique um tempo maior para contorn-las.  Pratique todas as tcnicas de controle do estresse ou autocalmantes conforme ensinado por seu mdico.  No culpe a si mesmo por recadas ou por Designer, industrial/product.  Tente reconhecer seus avanos, mesmo que sejam pequenos.  Comparea a todas as consultas de acompanhamento de acordo com as orientaes do seu mdico. Isso  importante. Entre em contato com um mdico se:  Seus sintomas no melhorarem.  Seus sintomas piorarem.  Voc tiver sinais de depresso, como: ? Um humor persistentemente triste, abatido ou irritvel. ? Perda de prazer em atividades que sempre foram prazerosas. ? Alterao no peso ou no apetite. ? Alterao no sono. ? Evitar os amigos ou familiares. ? Falta de energia em tarefas normais. ? Sensao de culpa ou falta de valor. [place] ajuda imediatamente se:  Considerar seriamente a ideia de fazer mal a si mesmo ou a outras pessoas. Se sentir  vontade de ferir a si mesmo ou a terceiros ou pensar em tirar a prpria vida, procure ajuda imediatamente. Voc pode ir para o pronto-socorro mais prximo ou ligar para:  O nmero de Restaurant manager, fast food (911, nos [place]).  Um servio telefnico de preveno do suicdio, como o National Suicide Prevention Lifeline, no nmero [phone]. Ele funciona 24 horas por dia. Resumo  O transtorno da ansiedade generalizada (TAG)  um distrbio de sade mental que envolve um tipo de ansiedade que no  causado por um evento especfico.  Pessoas com TAG geralmente se preocupam em excesso com muitas coisas em [employer], como sua sade e sua famlia.  O TAG pode causar sintomas fsicos como inquietao, dificuldade de concentrao, problemas para dormir, transpirao frequente, enjoo, diarreia, dor de cabea e tremedeira ou espasmos musculares.  Um especialista em sade mental poder determinar qual tratamento  melhor para voc. Algumas pessoas obtm melhoras com um nico tratamento. No entanto, outras pessoas necessitam de uma combinao de tratamentos. Estas informaes no se destinam a substituir as recomendaes de seu mdico. No deixe de discutir quaisquer dvidas com seu mdico. Document Released: 11/22/2015 Document Revised: 02/25/2017 Document Reviewed: 02/25/2017 Elsevier Interactive Patient Education  2019 [employer].

## 2018-11-24 NOTE — Progress Notes (Signed)
Patient ID: Meghan Reeves, female    DOB: 1981-10-08, 38 y.o.   MRN: [id number]  PCP: Scot Jun, FNP  Chief Complaint  Patient presents with  . Annual Exam    Subjective:  HPI Meghan Reeves is a 38 y.o. female, previous smoker, presents for complete physical exam. Quit smoking prior to birth of last child in 2018. C-section delivery of 1 st and only child 2018 Contraception Nexplanon placed 2018 in left arm.  Health Promotion: Health Screening Current/Overdue:   Immunizations: TDAP due  Receive influenza 2019 PAP-received within 2 years Last Dental Exam:none, no insurance Last Eye Exam: None, normal eye exam   Anxiety and stress Feels stress and anxiety mostly with life stressors and being a mom. She is a stay at home mom. Suffers from worry and fear that something will happen to her baby. Endorses family support, however feels she would benefit from medication. Endorses difficulty sleeping and fatigue. Anxiety is not impacting appetite. Current Body mass index is 30 kg/m. She engages in no routine physical exercise. She doesn't drink alcohol. No prior use of illicit drugs.  Chronic conditions include: Patient Active Problem List   Diagnosis Date Noted  . Mixed headache 10/21/2018  . Post-dates pregnancy 09/06/2017  . Spanish speaking patient 10/01/2016  . Appendicitis, acute, with peritonitis s/p lap appendectomy 09/29/2016 09/29/2016      Current home medications include: Prior to Admission medications   Medication Sig Start Date End Date Taking? Authorizing Provider  albuterol (PROVENTIL HFA;VENTOLIN HFA) 108 (90 Base) MCG/ACT inhaler Inhale 2 puffs into the lungs every 6 (six) hours as needed for wheezing or shortness of breath. 10/18/18   Scot Jun, FNP  butalbital-acetaminophen-caffeine (FIORICET, ESGIC) [phone] MG tablet Take 1 tablet by mouth every 4 (four) hours as needed for headache. 10/19/18   Scot Jun, FNP  ibuprofen  (ADVIL,MOTRIN) 600 MG tablet Take 1 tablet (600 mg total) by mouth every 6 (six) hours as needed. 09/18/18   Manya Silvas, CNM    Family History  Problem Relation Age of Onset  . Asthma Mother      Allergies  Allergen Reactions  . Topamax [Topiramate] Other (See Comments)    Blurry vision, numbness of lips    Social History   Socioeconomic History  . Marital status: Significant Other    Spouse name: Not on file  . Number of children: Not on file  . Years of education: Not on file  . Highest education level: Not on file  Occupational History  . Not on file  Social Needs  . Financial resource strain: Not on file  . Food insecurity:    Worry: Not on file    Inability: Not on file  . Transportation needs:    Medical: Not on file    Non-medical: Not on file  Tobacco Use  . Smoking status: Current Every Day Smoker    Packs/day: 0.25    Years: 10.00    Pack years: 2.50    Types: Cigars  . Smokeless tobacco: Never Used  Substance and Sexual Activity  . Alcohol use: No    Alcohol/week: 0.0 standard drinks  . Drug use: No  . Sexual activity: Yes    Birth control/protection: None  Lifestyle  . Physical activity:    Days per week: Not on file    Minutes per session: Not on file  . Stress: Not on file  Relationships  . Social connections:    Talks on phone: Not on file  Gets together: Not on file    Attends religious service: Not on file    Active member of club or organization: Not on file    Attends meetings of clubs or organizations: Not on file    Relationship status: Not on file  . Intimate partner violence:    Fear of current or ex partner: Not on file    Emotionally abused: Not on file    Physically abused: Not on file    Forced sexual activity: Not on file  Other Topics Concern  . Not on file  Social History Narrative  . Not on file    Review of Systems Constitutional: Negative for fever, chills, diaphoresis, activity change, appetite change and  fatigue. HENT: Negative for ear pain, nosebleeds, congestion, facial swelling, rhinorrhea, neck pain, neck stiffness and ear discharge.  Eyes: Negative for pain, discharge, redness, itching and visual disturbance. Respiratory: Negative for cough, choking, chest tightness, shortness of breath, wheezing and stridor.  Cardiovascular: Negative for chest pain, palpitations and leg swelling. Gastrointestinal: Negative for abdominal distention. Genitourinary: Negative for dysuria, urgency, frequency, hematuria, flank pain, decreased urine volume, difficulty urinating and dyspareunia.  Musculoskeletal: Negative for back pain, joint swelling, arthralgia and gait problem. Neurological: Suffers from chronic intermittent migraine headaches  Hematological: Negative for adenopathy. Does not bruise/bleed easily. Psychiatric/Behavioral: Endorses anxiousness and stress Past Medical, Surgical Family and Social History reviewed and updated.  Objective:  There were no vitals filed for this visit.  Wt Readings from Last 3 Encounters:  10/18/18 156 lb 3.2 oz (70.9 kg)  09/22/18 157 lb (71.2 kg)  09/18/18 157 lb 8 oz (71.4 kg)    Physical Exam Constitutional: Patient appears well-developed and well-nourished. No distress. HENT: Normocephalic, atraumatic, External right and left ear normal. Oropharynx is clear and moist.  Eyes: Conjunctivae and EOM are normal. PERRLA, no scleral icterus. Neck: Normal ROM. Neck supple. No JVD. No tracheal deviation. No thyromegaly. CVS: RRR, S1/S2 +, no murmurs, no gallops, no carotid bruit.  Pulmonary: Effort and breath sounds normal, no stridor, rhonchi, wheezes, rales.  Abdominal: Soft. BS +, no distension, tenderness, rebound or guarding.  Musculoskeletal: Normal range of motion. No edema and no tenderness.  Neuro: Alert. Normal reflexes, muscle tone coordination. No cranial nerve deficit. Skin: Skin is warm and dry. No rash noted. Not diaphoretic. No erythema. No  pallor. Psychiatric: Normal mood and affect. Behavior, judgment, thought content normal.   Assessment & Plan:  1. Encounter for annual physical exam Age-appropriate anticipatory guidance provided  2. Screening for blood or protein in urine - POCT URINALYSIS DIP (CLINITEK)  3. Screening for deficiency anemia - CBC with Differential - Iron, TIBC and Ferritin Panel  4. Screening for diabetes mellitus - Hemoglobin A1c  5. Screening, lipid - Lipid panel  6. Screening for thyroid disorder - Thyroid Panel With TSH  7.  Need for Tdap vaccination Given  8.  Generalized anxiety disorder Will trial escitalopram 10 mg aily for long-term management of generalized anxiety disorder.  To manage current episodes of anxiousness will trial hydroxyzine 25 mg every 8 hours as needed.     Meds ordered this encounter  Medications  . ibuprofen (ADVIL,MOTRIN) 600 MG tablet    Sig: Take 1 tablet (600 mg total) by mouth every 6 (six) hours as needed.    Dispense:  30 tablet    Refill:  2  . escitalopram (LEXAPRO) 10 MG tablet    Sig: Take 1 tablet (10 mg total) by mouth at bedtime.  Dispense:  60 tablet    Refill:  0  . hydrOXYzine (ATARAX/VISTARIL) 25 MG tablet    Sig: Take 1 tablet (25 mg total) by mouth every 8 (eight) hours as needed (sleeping).    Dispense:  30 tablet    Refill:  0    Molli Barrows, FNP Primary Care at [facility] [address], [place] [phone]: [phone]

## 2018-11-25 LAB — LIPID PANEL
CHOLESTEROL TOTAL: 243 mg/dL — AB (ref 100–199)
Chol/HDL Ratio: 4.9 ratio — ABNORMAL HIGH (ref 0.0–4.4)
HDL: 50 mg/dL (ref >39)
LDL Calculated: 135 mg/dL — ABNORMAL HIGH (ref 0–99)
Triglycerides: 292 mg/dL — ABNORMAL HIGH (ref 0–149)
VLDL CHOLESTEROL CAL: 58 mg/dL — AB (ref 5–40)

## 2018-11-25 LAB — CBC WITH DIFFERENTIAL/PLATELET
BASOS ABS: 0 10*3/uL (ref 0.0–0.2)
BASOS: 1 %
EOS (ABSOLUTE): 0.2 10*3/uL (ref 0.0–0.4)
Eos: 3 %
Hematocrit: 43.1 % (ref 34.0–46.6)
Hemoglobin: 15.2 g/dL (ref 11.1–15.9)
Immature Grans (Abs): 0 10*3/uL (ref 0.0–0.1)
Immature Granulocytes: 0 %
LYMPHS: 33 %
Lymphocytes Absolute: 2.9 10*3/uL (ref 0.7–3.1)
MCH: 32.3 pg (ref 26.6–33.0)
MCHC: 35.3 g/dL (ref 31.5–35.7)
MCV: 92 fL (ref 79–97)
MONOS ABS: 0.4 10*3/uL (ref 0.1–0.9)
Monocytes: 5 %
Neutrophils Absolute: 5.1 10*3/uL (ref 1.4–7.0)
Neutrophils: 58 %
PLATELETS: 299 10*3/uL (ref 150–450)
RBC: 4.71 x10E6/uL (ref 3.77–5.28)
RDW: 12.5 % (ref 11.7–15.4)
WBC: 8.7 10*3/uL (ref 3.4–10.8)

## 2018-11-25 LAB — IRON,TIBC AND FERRITIN PANEL
Ferritin: 66 ng/mL (ref 15–150)
IRON: 105 ug/dL (ref 27–159)
Iron Saturation: 31 % (ref 15–55)
TIBC: 342 ug/dL (ref 250–450)
UIBC: 237 ug/dL (ref 131–425)

## 2018-11-25 LAB — HEMOGLOBIN A1C
ESTIMATED AVERAGE GLUCOSE: 108 mg/dL
Hgb A1c MFr Bld: 5.4 % (ref 4.8–5.6)

## 2018-11-25 LAB — THYROID PANEL WITH TSH
FREE THYROXINE INDEX: 1.8 (ref 1.2–4.9)
T3 UPTAKE RATIO: 25 % (ref 24–39)
T4, Total: 7.1 ug/dL (ref 4.5–12.0)
TSH: 2.32 u[IU]/mL (ref 0.450–4.500)

## 2018-12-06 NOTE — Progress Notes (Signed)
[facility]    Provider:  Dr Jaynee Eagles Referring Provider: Emergency Room Primary Care Provider:  Scot Jun, FNP  CC:  Chronic daily headaches  HPI:  Meghan Reeves is a 38 y.o. female here as requested by Emergency Room for headaches. She reports that she has headaches everyday. Headaches started several months ago. Milder headaches are described as a pressure sensation across the top of her head. Pressure is throbbing. Rated 5-6/10. She is nauseated at times with mild headaches. She has more severe headaches about 1-2 times a week. She describes these headaches as more unilateral (side varies). She feels a pounding sensation. Rated 7-8/10. These headaches are associated with sound and light sensitivity. She has a vision disturbance described as a white veil that she can see on the same side she is having headache. She is nauseated with headaches but does not vomit. She denies dizziness or syncope with headaches. She feels that stress is a trigger. She has not noticed any alleviating factors with exception of rest. She has noticed that headaches last all day if not treated. She is taking Fioricet twice daily most days (occasionally once daily.) She was given prescription for 90 tablets of Fioricet on 12/11 and has about 7-8 tablets left. She was given Topamax at one UC visit that caused numbness of her lip and she saw flashy lights. She took this for about a week then stopped. Here with interpreter. No other focal neurologic deficits, associated symptoms, inciting events or modifiable factors.  She does have trouble going to sleep. She may fall asleep around 2-3am and gets up around 9am. She does not snore. She rarely wakes up with a headache. She denies dry mouth in the mornings.   She is taking Lexapro daily for anxiety. She feels that this medication is helping some but she feels sleepy during the day.   She had a normal CT of head in 09/2018. She has never had an MRI.  She does not drink or smoke. She has a sister with migraines that is treated with Advil.   Reviewed notes, labs and imaging from outside physicians, which showed: Patient was referred from the emergency room.  She was seen at the emergency room in November 2019.  She is also seen Molli Barrows as well in the office earlier this month.  She reported having lots of headaches for the last couple months.  She had 3 ER visits into urgent care visits for the same.  She was taking Advil migraine on a daily basis.  She was tried on Topamax for prevention 100 mg at bedtime and she had side effects including anxiety.  Reviewed office notes from Dr. Kenton Kingfisher.  Patient was seen in the emergency room multiple times.  She is been taking a combination of pseudoephedrine and antihistamine and feels headache improved.  She reports headaches occur almost daily.  Localized to the left side of the head occasionally is like a tension sensation across the bilateral head.  Try Topamax did not feel the medication work and discontinued it.  Endorsed nausea and light sensitivity when headaches occur.  She was given Fioricet 90 capsules.  Review of Systems: Patient complains of symptoms per HPI as well as the following symptoms headaches, insomnia. Pertinent negatives and positives per HPI. All others negative.   Social History   Socioeconomic History  . Marital status: Significant Other    Spouse name: Not on file  . Number of children: 1  . Years of education: Not  on file  . Highest education level: 6th grade  Occupational History  . Not on file  Social Needs  . Financial resource strain: Not on file  . Food insecurity:    Worry: Not on file    Inability: Not on file  . Transportation needs:    Medical: Not on file    Non-medical: Not on file  Tobacco Use  . Smoking status: Former Smoker    Packs/day: 0.25    Years: 10.00    Pack years: 2.50    Types: Cigarettes  . Smokeless tobacco: Never Used  Substance  and Sexual Activity  . Alcohol use: No    Alcohol/week: 0.0 standard drinks  . Drug use: No  . Sexual activity: Yes    Birth control/protection: None  Lifestyle  . Physical activity:    Days per week: Not on file    Minutes per session: Not on file  . Stress: Not on file  Relationships  . Social connections:    Talks on phone: Not on file    Gets together: Not on file    Attends religious service: Not on file    Active member of club or organization: Not on file    Attends meetings of clubs or organizations: Not on file    Relationship status: Not on file  . Intimate partner violence:    Fear of current or ex partner: Not on file    Emotionally abused: Not on file    Physically abused: Not on file    Forced sexual activity: Not on file  Other Topics Concern  . Not on file  Social History Narrative   Lives at home with boyfriend    Right handed   Caffeine: 30 oz daily of coca cola    Family History  Problem Relation Age of Onset  . Asthma Mother   . Migraines Sister     Past Medical History:  Diagnosis Date  . Asthma, mild   . Medical history non-contributory     Patient Active Problem List   Diagnosis Date Noted  . Migraine without aura and with status migrainosus, not intractable 12/08/2018  . Medication overuse headache 12/08/2018  . Mixed headache 10/21/2018  . Post-dates pregnancy 09/06/2017  . Spanish speaking patient 10/01/2016  . Appendicitis, acute, with peritonitis s/p lap appendectomy 09/29/2016 09/29/2016    Past Surgical History:  Procedure Laterality Date  . CESAREAN SECTION N/A 09/07/2017   Procedure: CESAREAN SECTION;  Surgeon: Mora Bellman, MD;  Location: [facility];  Service: Obstetrics;  Laterality: N/A;  . LAPAROSCOPIC APPENDECTOMY N/A 09/29/2016   Procedure: APPENDECTOMY LAPAROSCOPIC;  Surgeon: Greer Pickerel, MD;  Location: [facility];  Service: General;  Laterality: N/A;  . NO PAST SURGERIES      Current Outpatient Medications    Medication Sig Dispense Refill  . albuterol (PROVENTIL HFA;VENTOLIN HFA) 108 (90 Base) MCG/ACT inhaler Inhale 2 puffs into the lungs every 6 (six) hours as needed for wheezing or shortness of breath. 1 Inhaler 0  . butalbital-acetaminophen-caffeine (FIORICET, ESGIC) 50-325-40 MG tablet Take 1 tablet by mouth every 4 (four) hours as needed for headache. 60 tablet 1  . escitalopram (LEXAPRO) 10 MG tablet Take 1 tablet (10 mg total) by mouth at bedtime. 60 tablet 0  . etonogestrel (NEXPLANON) 68 MG IMPL implant 1 each by Subdermal route once.    . hydrOXYzine (ATARAX/VISTARIL) 25 MG tablet Take 1 tablet (25 mg total) by mouth every 8 (eight) hours as needed (sleeping). [place]  tablet 0  . Fremanezumab-vfrm (AJOVY) 225 MG/1.5ML SOSY Inject 1.5 mLs into the skin every 30 (thirty) days. 6 Syringe 0  . ibuprofen (ADVIL,MOTRIN) 600 MG tablet Take 1 tablet (600 mg total) by mouth every 6 (six) hours as needed. (Patient not taking: Reported on 12/07/2018) 30 tablet 2  . rizatriptan (MAXALT) 10 MG tablet Take 1 tablet (10 mg total) by mouth as needed for migraine. May repeat in 2 hours if needed 10 tablet 11   No current facility-administered medications for this visit.     Allergies as of 12/07/2018 - Review Complete 12/07/2018  Allergen Reaction Noted  . Topamax [topiramate] Other (See Comments) 10/18/2018    Vitals: BP 107/72 (BP Location: Right Arm, Patient Position: Sitting)   Pulse 77   Ht 5\' 1"  (1.549 m)   Wt 163 lb (73.9 kg)   BMI 30.80 kg/m  Last Weight:  Wt Readings from Last 1 Encounters:  12/07/18 163 lb (73.9 kg)   Last Height:   Ht Readings from Last 1 Encounters:  12/07/18 5\' 1"  (1.549 m)     Physical exam: Exam: Gen: NAD, conversant, well nourised, obese, well groomed                     CV: RRR, no MRG. No Carotid Bruits. No peripheral edema, warm, nontender Eyes: Conjunctivae clear without exudates or hemorrhage  Neuro: Detailed Neurologic Exam  Speech:    Speech is  normal; fluent and spontaneous with normal comprehension.  Cognition:    The patient is oriented to person, place, and time;     recent and remote memory intact;     language fluent;     normal attention, concentration,     fund of knowledge Cranial Nerves:    The pupils are equal, round, and reactive to light. The fundi are normal and spontaneous venous pulsations are present. Visual fields are full to finger confrontation. Extraocular movements are intact. Trigeminal sensation is intact and the muscles of mastication are normal. The face is symmetric. The palate elevates in the midline. Hearing intact. Voice is normal. Shoulder shrug is normal. The tongue has normal motion without fasciculations.   Coordination:    Normal finger to nose and heel to shin. Normal rapid alternating movements.   Gait:    Heel-toe and tandem gait are normal.   Motor Observation:    No asymmetry, no atrophy, and no involuntary movements noted. Tone:    Normal muscle tone.    Posture:    Posture is normal. normal erect    Strength:    Strength is V/V in the upper and lower limbs.      Sensation: intact to LT     Reflex Exam:  DTR's:    Deep tendon reflexes in the upper and lower extremities are normal bilaterally.   Toes:    The toes are downgoing bilaterally.   Clonus:    Clonus is absent.    Assessment/Plan:  Chronic daily headaches due to migraines and medication overuse  I had a long discussion with patient that her daily use of ibuprofen or Tylenol or Fioricet can cause medication overuse/rebound headache which is likely causing her chronic daily headaches. They only thing to do is to stop the medication unfortunately. In the timeframe after stopping at her headaches may get much worse. I will give her some medication(Ajovy and Maxalt) to try to bridge that. She should significantly  improve with her chronic daily headaches after 2-4 weeks  of being off her daily over-the-counter medications. Do  not use acute management medications medications more than 2 times in a week.  Migraines: Discussed migraine management including acute management and preventative management. We'll start patient on both. Discussed Ajovy side effects especially teratogenicity do not get pregnant and use birth control. Gave her 6 months of samples, there is excellent literature and a large clinical trial showing CGRP medications are helpful in treating chronic migraines with medication overuse.  Remember to drink plenty of fluid, eat healthy meals and do not skip any meals. Try to eat protein with a every meal and eat a healthy snack such as fruit or nuts in between meals. Try to keep a regular sleep-wake schedule and try to exercise daily, particularly in the form of walking, 20-30 minutes a day, if you can.   As far as your medications are concerned, I would like to suggest: - Stop daily over the counter med use (Tylenol, excedrin, ibuprofen, alleve) Do not tak emore than 2-3x in one week. STOP FIORICET.  -At onset of Migraine Maxalt. Please take one tablet at the onset of your headache. If it does not improve the symptoms please take one additional tablet. Do not take more then 2 tablets in 24hrs. Do not take use more then 2 to 3 days in a week.  As far as diagnostic testing: If headaches persist need MRI brain  Discussed; To prevent or relieve headaches, try the following:  Cool Compress. Lie down and place a cool compress on your head.   Avoid headache triggers. If certain foods or odors seem to have triggered your migraines in the past, avoid them. A headache diary might help you identify triggers.   Include physical activity in your daily routine. Try a daily walk or other moderate aerobic exercise.   Manage stress. Find healthy ways to cope with the stressors, such as delegating tasks on your to-do list.   Practice relaxation techniques. Try deep breathing, yoga, massage and visualization.   Eat  regularly. Eating regularly scheduled meals and maintaining a healthy diet might help prevent headaches. Also, drink plenty of fluids.   Follow a regular sleep schedule. Sleep deprivation might contribute to headaches  Consider biofeedback. With this mind-body technique, you learn to control certain bodily functions - such as muscle tension, heart rate and blood pressure - to prevent headaches or reduce headache pain.    Proceed to emergency room if you experience new or worsening symptoms or symptoms do not resolve, if you have new neurologic symptoms or if headache is severe, or for any concerning symptom.     No orders of the defined types were placed in this encounter.  Meds ordered this encounter  Medications  . rizatriptan (MAXALT) 10 MG tablet    Sig: Take 1 tablet (10 mg total) by mouth as needed for migraine. May repeat in 2 hours if needed    Dispense:  10 tablet    Refill:  11    Please do not refill early    Order Specific Question:   Supervising Provider    Answer:   Melvenia Beam [id number]  . Fremanezumab-vfrm (AJOVY) 225 MG/1.5ML SOSY    Sig: Inject 1.5 mLs into the skin every 30 (thirty) days.    Dispense:  6 Syringe    Refill:  0    Order Specific Question:   Supervising Provider    Answer:   Melvenia Beam [[id number]]    Order Specific Question:  Lot Number?    Answer:   [id number]    Order Specific Question:   Expiration Date?    Answer:   05/09/2020    Order Specific Question:   [place]    Answer:   [id number] [[id number]]    Order Specific Question:   Quantity    Answer:   6    Cc:  Scot Jun, FNP  Sarina Ill, MD  [facility] [address] [place], [place] [address]  Phone [phone] Fax [phone]

## 2018-12-07 ENCOUNTER — Encounter: Payer: Self-pay | Admitting: Neurology

## 2018-12-07 ENCOUNTER — Ambulatory Visit: Payer: Self-pay | Admitting: Neurology

## 2018-12-07 VITALS — BP 107/72 | HR 77 | Ht 61.0 in | Wt 163.0 lb

## 2018-12-07 DIAGNOSIS — G4440 Drug-induced headache, not elsewhere classified, not intractable: Secondary | ICD-10-CM

## 2018-12-07 DIAGNOSIS — G43001 Migraine without aura, not intractable, with status migrainosus: Secondary | ICD-10-CM

## 2018-12-07 MED ORDER — RIZATRIPTAN BENZOATE 10 MG PO TABS: 10.0000 mg | ORAL_TABLET | ORAL | 11 refills | Status: DC | PRN

## 2018-12-07 MED ORDER — FREMANEZUMAB-VFRM 225 MG/1.5ML ~~LOC~~ SOSY: 1.5000 mL | PREFILLED_SYRINGE | SUBCUTANEOUS | 0 refills | Status: DC

## 2018-12-07 NOTE — Patient Instructions (Addendum)
Start rizatriptan as prescribed (take one tablet with headache, may repeat 1 tablet after two hours if not resolved. Do not take more than 2 tablets in 24 hours, no more than 10 per month)  Ajovy once per month for next 5 months (first dose given today in office, next dose due February 29th then every month)  Please avoid use of Fioricet or OTC medications more than once per week.    Cefalea migraosa Migraine Headache  Una cefalea migraosa es un dolor intenso y punzante en uno o ambos lados de la cabeza. Las migraas tambin pueden causar otros sntomas, como nuseas, vmitos y sensibilidad a la luz y el ruido. Cules son las causas? Hay ciertos factores que pueden provocar migraas, como los siguientes:  Alcohol.  Fumar.  Medicamentos, por ejemplo: ? Medicamentos para Best boy torcico (nitroglicerina). ? Pldoras anticonceptivas. ? Comprimidos de estrgeno. ? Ciertos medicamentos para la presin arterial.  Quesos curados, chocolate o cafena.  Los alimentos o las bebidas que contienen nitratos, glutamato, aspartamo o tiramina.  Realizar actividad fsica. Otros factores que pueden provocar migraa incluyen los siguientes:  La menstruacin  Donald.  Hambre.  Estrs, poco o demasiado sueo, o fatiga.  Los cambios climticos. Qu incrementa el riesgo? Los siguientes factores pueden hacer que usted sea ms propenso a Best boy migraas:  La edad. Los riesgos aumentan con la edad.  Antecedentes familiares de migraa.  Ser de Science writer.  Depresin y ansiedad.  Obesidad.  Ser mujer.  Tener un agujero en el corazn (persistencia del agujero oval) u otros problemas cardacos. Cules son los signos o los sntomas? El principal sntoma de esta afeccin es el dolor intenso y punzante. El dolor:  Puede aparecer en cualquier regin de la cabeza, tanto de un lado como de [place].  Puede interferir con las actividades de la vida cotidiana.  Puede  empeorar con la actividad fsica.  Puede empeorar ante la exposicin a luces brillantes o a ruidos fuertes. Otros sntomas pueden incluir lo siguiente:  Nuseas.  Vmitos.  Mareos.  Sensibilidad general a las luces brillantes, a los ruidos fuertes o a los [employer]. Antes de sufrir una migraa, puede percibir seales de advertencia (aura). Un aura puede incluir:  Ver luces intermitentes o tener puntos ciegos.  Ver puntos brillantes, halos o lneas en zigzag.  Tener una visin en tnel o visin borrosa.  Sentir entumecimiento u hormigueo.  Tener dificultad para hablar.  Debilidad muscular. Cmo se diagnostica? La cefalea migraosa se diagnostica en funcin de lo siguiente:  Sus sntomas.  Un examen fsico.  Estudios, como una tomografa computarizada o una resonancia magntica de la cabeza. Estos estudios de diagnstico por imgenes pueden ayudar a Freight forwarder causas de cefalea.  Truddie Coco de lquido cefalorraqudeo (puncin lumbar) para Physiological scientist (anlisis de lquido cefalorraqudeo o anlisis de LCR). Cmo se trata? Las cefaleas migraosas suelen tratarse con medicamentos que:  Financial trader.  Emery nuseas.  Evitan la recurrencia de las migraas. El tratamiento tambin puede incluir lo siguiente:  Acupuntura.  Cambios en el estilo de vida, como evitar alimentos que provoquen [place]. Siga estas instrucciones en su casa: Medicamentos  [employer] de venta libre y los recetados solamente como se lo haya indicado el mdico.  No conduzca ni use maquinaria pesada mientras toma analgsicos recetados.  A fin de prevenir o tratar el estreimiento mientras toma analgsicos recetados, el mdico puede recomendarle lo siguiente: ? Beba suficiente lquido para mantener la orina clara o de color  amarillo plido. ? Tomar medicamentos recetados o de [employer]. ? Consumir alimentos ricos en fibra, como frutas y verduras frescas, cereales integrales  y frijoles. ? Limitar el consumo de alimentos con alto contenido de grasas y azcares procesados, como alimentos fritos o dulces. Estilo de vida  Evite el consumo de alcohol.  No consuma ningn producto que contenga nicotina o tabaco, como cigarrillos y Psychologist, sport and exercise. Si necesita ayuda para dejar de fumar, consulte al mdico.  Duerma como mnimo 8horas todas las noches.  Evite las situaciones de estrs. Instrucciones generales   Lleve un registro diario para Neurosurgeon lo que Financial trader. Por ejemplo, escriba: ? Lo que usted come y bebe. ? Cunto tiempo duerme. ? Algn cambio en su dieta o en los medicamentos.  Si tiene una migraa: ? Evite los factores que [employer] sntomas, como las luces brillantes. ? Resulta til acostarse en una habitacin oscura y silenciosa. ? No conduzca vehculos ni opere maquinaria pesada. ? Pregntele al mdico qu actividades son seguras para usted cuando tiene sntomas.  Concurra a todas las visitas de control como se lo haya indicado el mdico. Esto es importante. Comunquese con un mdico si:  Tiene sntomas de migraa distintos o ms intensos que los habituales. Solicite ayuda de inmediato si:  La migraa se hace cada vez ms intensa.  Tiene fiebre.  Presenta rigidez en el cuello.  Tiene prdida de visin.  Siente debilidad en los msculos o que no puede controlarlos.  Comienza a perder el equilibrio con frecuencia.  Comienza a tener dificultades para caminar.  Se desmaya. Esta informacin no tiene Marine scientist el consejo del mdico. Asegrese de hacerle al mdico cualquier pregunta que tenga. Document Released: 10/26/2005 Document Revised: 02/02/2017 Document Reviewed: 04/13/2016 Elsevier Interactive Patient Education  2019 [place] rebote por consumo excesivo de analgsicos (Analgesic Rebound Headache) Una cefalea de rebote por consumo excesivo de analgsicos, a  veces llamada cefalea por abuso de medicamentos, es un dolor de cabeza que se manifiesta despus de que desaparece el efecto de los analgsicos tomados para tratar el dolor de cabeza original (primario). Cualquier tipo de dolor de cabeza primario puede regresar como una cefalea de rebote si una persona toma analgsicos regularmente ms de tres veces por semana para tratarlo. Los tipos de Social research officer, government de cabeza primario que se suelen TEFL teacher a las cefaleas de rebote incluyen:  Migraas.  Dolores de cabeza que surgen por una tensin muscular en la zona de la cabeza y del cuello (cefaleas tensionales).  Dolores de cabeza que aparecen y vuelven a Arts administrator (recurrentes) de un lado de la cabeza y alrededor del ojo (cefaleas en racimos). Si las cefaleas de rebote continan, estas se convierten en dolores de cabeza diarios crnicos. CAUSAS Esta afeccin puede ser causada por el uso frecuente de:  Medicamentos de venta libre, como aspirina, ibuprofeno y paracetamol.  Antisinusticos y otros medicamentos que contienen cafena.  Analgsicos opiceos, como codena y [place]. SNTOMAS Los sntomas de la cefalea de rebote son los mismos que los del dolor de [place] original. Algunos de los sntomas de tipos especficos de cefaleas incluyen lo siguiente: Consulting civil engineer  Dolor pulstil e intermitente en uno o ambos lados de la cabeza.  Dolor intenso que dificulta las actividades cotidianas.  Dolor que empeora con la actividad fsica.  Nuseas, vmitos o ambos.  Dolor ante la exposicin a luces brillantes, ruidos fuertes o aromas intensos.  Sensibilidad general a las luces brillantes, a los ruidos fuertes o  a los aromas intensos.  Cambios en la visin.  Adormecimiento de uno o [employer]. Cefalea tensional.  Presin alrededor de la cabeza.  Dolor "sordo" en la cabeza.  Dolor que siente sobre la frente y los lados de la cabeza.  Sensibilidad en los msculos de la cabeza, del cuello y de los  hombros. Darden en racimos  Dolor intenso que comienza en un ojo, alrededor de un ojo o en la sien.  Enrojecimiento y lagrimeo del ojo del mismo lado del dolor.  Prpados cados o hinchados.  Dolor en un lado de la cabeza.  Nuseas.  Secrecin nasal.  Piel del rostro sudorosa y plida.  Agitacin. DIAGNSTICO Esta afeccin se diagnostica mediante:  Una revisin de sus antecedentes mdicos. Esto incluye la naturaleza de sus dolores de cabeza primarios.  Una revisin de los tipos de analgsicos que ha estado tomando para tratar las cefaleas y la frecuencia con que los toma. TRATAMIENTO Esta afeccin se puede tratar de la siguiente manera:  Interrumpir el uso frecuente de los analgsicos. Al principio, esto puede [employer] cefaleas, pero, con el tiempo, el dolor debe volverse ms controlable, menos frecuente y Principal Financial.  Consultar a un especialista en cefaleas. Tal vez este profesional pueda ayudarlo a Chief Technology Officer las cefaleas y ayudar a Programmer, multimedia que estas no tengan otra causa.  Emplear mtodos alternativos de alivio del estrs, como la acupuntura, la psicoterapia, la biorregulacin y los masajes tambin pueden ayudar. Hable con el mdico respecto de qu mtodo podra ser adecuado para su caso. Maurice los medicamentos de venta libre y los recetados solamente como se lo haya indicado el mdico.  Interrumpa el uso repetido de los [place], como se lo haya indicado el mdico. Tal interrupcin puede ser difcil. Siga atentamente las indicaciones del mdico.  Evite los factores desencadenantes que se sabe le causan las cefaleas primarias.  Concurra a todas las visitas de control como se lo haya indicado el mdico. Esto es importante. SOLICITE ATENCIN MDICA SI:  Sigue teniendo cefaleas despus de Optometrist los tratamientos que el mdico recomend. SOLICITE ATENCIN MDICA DE INMEDIATO SI:  Tiene nuevas cefaleas.  Tiene  cefaleas que son diferentes de las que tuvo en el pasado.  Tiene adormecimiento u hormigueos en los brazos o las piernas.  Tiene cambios en la visin o en el habla. Esta informacin no tiene Marine scientist el consejo del mdico. Asegrese de hacerle al mdico cualquier pregunta que tenga. Document Released: 01/22/2009 Document Revised: 11/16/2014 Document Reviewed: 03/30/2016 Elsevier Interactive Patient Education  2019 [place] injection Qu es este medicamento? [facility] se [place] para prevenir migraas. Este medicamento puede ser utilizado para otros usos; si tiene alguna pregunta consulte con su proveedor de atencin mdica o con su farmacutico. MARCAS COMUNES: AJOVY Qu le debo informar a mi profesional de la salud antes de tomar este medicamento? Necesitan saber si usted presenta alguno de los siguientes problemas o situaciones: una reaccin alrgica o inusual al fremanezumab, a otros medicamentos, alimentos, colorantes o conservantes si est embarazada o buscando quedar embarazada si est amamantando a un beb Cmo debo utilizar este medicamento? Este medicamento se administra mediante una inyeccin por va subcutnea. Le ensearn cmo preparar y Biomedical engineer. selo exactamente como se le indique. Tome su medicamento a intervalos regulares. No tome su medicamento con una frecuencia mayor a la indicada. Es importante que deseche las agujas y las jeringas usadas en un recipiente resistente a  los pinchazos. No los deseche en la basura. Si no tiene un recipiente resistente a los pinchazos, consulte a Midwife o proveedor de atencin de la salud para obtenerlo. Hable con su pediatra para informarse acerca del uso de este medicamento en nios. Puede requerir atencin especial. Sobredosis: Pngase en contacto inmediatamente con un centro toxicolgico o una sala de urgencia si usted cree que haya tomado demasiado medicamento. ATENCIN:  [employer] es solo para usted. No comparta este medicamento con nadie. Qu sucede si me olvido de una dosis? Si olvida una dosis, adminstrela lo antes posible. Si es casi la hora de la prxima dosis, tome solo esa dosis. No tome dosis adicionales o dobles. Qu puede interactuar con este medicamento? No se anticipan interacciones. Puede ser que esta lista no menciona todas las posibles interacciones. Informe a su profesional de KB Home	[place] de [employer] Corporation productos a base de hierbas, medicamentos de [place] o suplementos nutritivos que est tomando. Si usted fuma, consume bebidas alcohlicas o si utiliza drogas ilegales, indqueselo tambin a su profesional de KB Home	[place]. Algunas sustancias pueden interactuar con su medicamento. A qu debo estar atento al usar Coca-Cola? Informe a su mdico o a su profesional de la salud si sus sntomas no comienzan a mejorar o si empeoran. Qu efectos secundarios puedo tener al [employer] este medicamento? Efectos secundarios que debe informar a su mdico o a Barrister's clerk de la salud tan pronto como sea posible: Chief of Staff, como erupcin cutnea, comezn/picazn o urticaria, e hinchazn de la cara, los labios o la lengua Efectos secundarios que generalmente no requieren atencin mdica (debe informarlos a su mdico o a Barrister's clerk de la salud si persisten o si son molestos): Social research officer, government, enrojecimiento o Actor de la inyeccin Puede ser que esta lista no menciona todos los posibles efectos secundarios. Comunquese a su mdico por asesoramiento mdico Humana Inc. Usted puede informar los efectos secundarios a la FDA por telfono al 1-800-FDA-1088. Dnde debo guardar mi medicina? Mantenga fuera del alcance de los nios. Le indicarn cmo Geophysical data processor. Deseche todo el medicamento que no haya utilizado despus de la fecha de vencimiento indicada en la etiqueta. ATENCIN: Este folleto es un resumen.  Puede ser que no cubra toda la posible informacin. Si usted tiene preguntas acerca de esta medicina, consulte con su mdico, su farmacutico o su profesional de Technical sales engineer.  2019 Elsevier/Gold Standard (2017-10-04 00:00:00) Rizatriptan tablets Qu es este medicamento? El RIZATRIPTN se [place] para tratar las migraas con o sin aura. Una aura es una sensacin o perturbacin visual particular que advierte sobre el ataque. Este medicamento no se [place] para prevenir ataques de [place]. Este medicamento puede ser utilizado para otros usos; si tiene alguna pregunta consulte con su proveedor de atencin mdica o con su farmacutico. MARCAS COMUNES: [place] Qu le debo informar a mi profesional de la salud antes de tomar este medicamento? Necesita saber si usted presenta alguno de los [employer] o situaciones: -enfermedad intestinal o colitis -diabetes -antecedentes familiares de enfermedad cardiaca -pulso cardiaco rpido o irregular -enfermedad cardiaca o vascular, angina (dolor en el pecho) o ataque cardiaco previo -alta presin sangunea -alto nivel de colesterol -antecedentes de derrames cerebrales, accidentes isqumicos transitorios (AITs o "mini ataques") o hemorragias intracraneales -enfermedad heptica o renal -sobrepeso -mala circulacin -postmenopusica o extirpacin [place] del tero y los ovarios -enfermedad de [place] -trastorno de convulsiones -una reaccin alrgica o inusual al rizatriptn, a otros medicamentos, alimentos, colorantes o conservantes -si est  embarazada o buscando quedar embarazada -si est amamantando a un beb Cmo debo utilizar este medicamento? Este medicamento se toma por va oral con un vaso de agua. Siga las instrucciones de la etiqueta del [place]. Este medicamento se toma ante los primeros sntomas de [place]. No es para usar [employer]. Si la migraa regresas despus de una dosis, puede tomar otra dosis segn las indicaciones.  Debe dejar pasar al menos 2 horas entre dosis, y no tome ms de 30 mg en total en 24 horas. Si no hay ninguna mejora despus de la primera dosis, no tome una segunda dosis sin hablar con su mdico o profesional de KB Home	[place]. No tome su medicamento con una frecuencia mayor a la indicada. Hable con su pediatra para informarse acerca del uso de este medicamento en nios. Aunque este medicamento se puede recetar a nios tan pequeos como de 6 aos de edad en casos selectos, existen precauciones que deben tomarse. Sobredosis: Pngase en contacto inmediatamente con un centro toxicolgico o una sala de urgencia si usted cree que haya tomado demasiado medicamento. ATENCIN: [employer] Foods es solo para usted. No comparta este medicamento con nadie. Qu sucede si me olvido de una dosis? No se aplica en este caso. Este medicamento no es para uso regular. Qu puede interactuar con este medicamento? No tome esta medicina con ninguno de los medicamentos siguientes: -anfetamina, dextroanfetamina o cocana -dihidroergotamina, ergotamina, mesilatos ergoloides, metisergida o medicamentos de tipo ergotnico - no tomarlos durante 24 horas despus de tomar rizatriptn -altamisa -IMAOs, tales como Carbex, Eldepryl, Marplan, Nardil y Parnate - no tomar rizatriptn hasta 2 semanas despus de interrumpir el tratamiento con IMAOs -otros medicamentos para migraas, tales como almotriptn, eletriptn, naratriptn, sumatriptn, zolmitriptn - no tomarlos durante 24 horas despus de tomar rizatriptn -triptfano Esta medicina tambin puede interactuar con los siguientes medicamentos: -medicamentos para la depresin mental, la ansiedad o los estados de nimo -propranolol Puede ser que esta lista no menciona todas las posibles interacciones. Informe a su profesional de KB Home	[place] de [employer] Corporation productos a base de hierbas, medicamentos de [place] o suplementos nutritivos que est tomando. Si usted fuma, consume bebidas  alcohlicas o si utiliza drogas ilegales, indqueselo tambin a su profesional de KB Home	[place]. Algunas sustancias pueden interactuar con su medicamento. A qu debo estar atento al usar Coca-Cola? Slo tome este medicamento cuando tenga migraas. Tmelo al sentir sntomas de advertencia o al comienzo de un ataque de migraa. No debe utilizarlo en forma regular para prevenir migraas. Puede experimentar somnolencia o mareos. No conduzca ni utilice maquinaria ni haga nada que Associate Professor en estado de alerta hasta que sepa cmo le afecta este medicamento. Para reducir los [employer], no se siente ni se ponga de pie con rapidez, especialmente si es un paciente de edad avanzada. El alcohol puede aumentar su somnolencia, su sensacin de [place] y su enrojecimiento. Evite consumir bebidas alcohlicas. Al fumar, puede aumentar el riesgo de efectos secundarios relacionados con el corazn por el uso de [place]. Si toma medicamentos para migraas por 10 das o ms en un mes, las migraas pueden empeorar. Mantenga un diario de das de dolor de [place] y el uso de [place]. Consulte a su profesional de la salud si los ataques de migraas ocurren con ms frecuencia. Qu efectos secundarios puedo tener al Masco Corporation este medicamento? Efectos secundarios que debe informar a su mdico o a Barrister's clerk de la salud tan pronto como sea posible: -Dispensing optician  erupcin cutnea, picazn o urticarias, hinchazn de la cara, labios o lengua -pulso cardiaco rpido, lento o irregular -aumento o disminucin de la presin sangunea -convulsiones -calambres o dolor de estmago severo, diarrea con sangre -signos y sntomas de un cogulo sanguneo tales como problemas respiratorios; cambios en la visin; dolor en el pecho; dolor de cabeza severo, repentino; dolor, hinchazn, clida en la pierna; dificultad para hablar; entumecimiento o debilidad repentina de la cara, brazo o  pierna -hormigueo, dolor o entumecimiento en la cara, manos o pies Efectos secundarios que, por lo general, no requieren atencin mdica (debe informarlos a su mdico o a su profesional de la salud si persisten o si son molestos):s -somnolencia -boca seca -sensacin de calor, rubor o enrojecimiento de la cara -dolor de cabeza -dolor o calambre muscular -nuseas, vmitos -cansancio o debilidad inusual Puede ser que esta lista no menciona todos los posibles efectos secundarios. Comunquese a su mdico por asesoramiento mdico Humana Inc. Usted puede informar los efectos secundarios a la FDA por telfono al 1-800-FDA-1088. Dnde debo guardar mi medicina? Mantngala fuera del alcance de los nios. [place]dela a [employer], entre 15 y 41 grados C (47 y 15 grados F). Mantenga el envase bien cerrado. Deseche todo el medicamento que no haya utilizado, despus de la fecha de vencimiento. ATENCIN: Este folleto es un resumen. Puede ser que no cubra toda la posible informacin. Si usted tiene preguntas acerca de esta medicina, consulte con su mdico, su farmacutico o su profesional de Technical sales engineer.  2019 Elsevier/Gold Standard (2016-11-26 00:00:00)

## 2018-12-08 DIAGNOSIS — G43001 Migraine without aura, not intractable, with status migrainosus: Secondary | ICD-10-CM | POA: Insufficient documentation

## 2018-12-08 DIAGNOSIS — G4440 Drug-induced headache, not elsewhere classified, not intractable: Secondary | ICD-10-CM | POA: Insufficient documentation

## 2018-12-16 NOTE — Progress Notes (Signed)
Patient notified of results & recommendations. Expressed understanding. States that she hadn't eaten. Patient states that she will come to her next appointment fasting.

## 2018-12-19 ENCOUNTER — Telehealth: Payer: Self-pay | Admitting: Neurology

## 2018-12-19 NOTE — Telephone Encounter (Signed)
Pt states Dr. Jaynee Eagles prescribed Rizatriptan 10mg  for her and she takes one for headaches and if pain continues she takes another after 2hrs but she states that the headache is still there just not as strong. She would like to know if there is something else she can take with her current medication for the pain that continues. She also would like to know if it is not a good idea to take cold medicine while she is on Rizatriptan. She was under the weather last week and took the cold medicine and ended up with red irritated eyes. Please advise.

## 2018-12-19 NOTE — Telephone Encounter (Signed)
I see no record in the chart of this pt having ever seen Dr. Jaynee Eagles or even having medication prescribed by Dr. Jaynee Eagles. I spoke with Dr. Jaynee Eagles and she does not recall ever treating pt.

## 2018-12-19 NOTE — Telephone Encounter (Signed)
Pt had me look up this information  Meghan Reeves (37 yrs)          [address] [place] [address]           VHQ:[id number]  Pt states that her full name is [name] when she went to the hospital a while back she used [name] but when she brought her passport whomever entered it only entered [place] and dropped the [place].

## 2018-12-20 ENCOUNTER — Telehealth: Payer: Self-pay | Admitting: *Deleted

## 2018-12-20 NOTE — Telephone Encounter (Signed)
Per Dr. Jaynee Eagles, ok to try reglan for headache as well. 10 mg PO TID PRN #15, refills 2. No early refills. Will also advise pt give the Ajovy time to work. Ok to take cold medicine as long as it is not often.

## 2018-12-20 NOTE — Telephone Encounter (Signed)
Please see original call below from [facility]. Pt called in and gave her name which is another chart by the name of "Stephens November" DOB [birth year], mrn: [id number], last 4 digits of ssn [phone]. Per phone note "Pt states that her full name is [name] when she went to the hospital a while back she used [name] but when she brought her passport whomever entered it only entered [place] and dropped the [place]." Pt also able to confirm the address on this chart as [address] [place] [address] and the correct MRN: [id number].     "Pt states Dr. Jaynee Eagles prescribed Rizatriptan 10mg  for her and she takes one for headaches and if pain continues she takes another after 2hrs but she states that the headache is still there just not as strong. She would like to know if there is something else she can take with her current medication for the pain that continues. She also would like to know if it is not a good idea to take cold medicine while she is on Rizatriptan. She was under the weather last week and took the cold medicine and ended up with red irritated eyes. Please advise."

## 2018-12-20 NOTE — Telephone Encounter (Signed)
I called the patient using Meghan Reeves ID # [id number] with [employer]. Meghan Reeves LVM (ok per DPR) informing pt of instructions on taking Rizatriptan and that pt should not be taking this too often, not everyday. Also included in message that Dr. Jaynee Eagles would also like to offer Reglan to take for headache and nausea. Asked for a call back to discuss further.

## 2018-12-20 NOTE — Telephone Encounter (Signed)
I confirmed the address below, mrn and name Meghan Reeves with the chart that contains her recent appt on 12/07/2018 with Dr. Jaynee Eagles. I will copy and paste her original advice call into her other chart and complete the call there.

## 2018-12-21 NOTE — Telephone Encounter (Signed)
I called the patient once more today using pacific interpreter McKenzie ID # [id number] and he LVM (ok per DPR) informing pt of instructions on taking Rizatriptan 10 mg at onset of headache, do not wait too late or it may not work. Also advised that pt can repeat the dose in 2 hours if needed however do not take more than 2 tablets in 24 hours. Dr. Jaynee Eagles said it was ok to take cold medicine as long as it is not too often. She also would like to offer Reglan for her headache and nausea. I requested a call back to discuss this information before the prescription is sent in.

## 2018-12-22 NOTE — Telephone Encounter (Signed)
Pt called on 2/12 and asked to speak with someone who spoke spanish. I advised her she would need to call the language service line at [phone] for intrepretation. Pt said she understood and would call them.  FYI

## 2018-12-27 ENCOUNTER — Telehealth: Payer: Self-pay | Admitting: Neurology

## 2018-12-27 NOTE — Telephone Encounter (Signed)
Patient presented in lobby wanting to discuss migraine med. Needs an interpreter.

## 2018-12-28 NOTE — Telephone Encounter (Signed)
Meghan Reeves informed me that she gave patient the interpreter number to call.

## 2018-12-29 MED ORDER — METOCLOPRAMIDE HCL 10 MG PO TABS: 10.0000 mg | ORAL_TABLET | Freq: Three times a day (TID) | ORAL | 2 refills | Status: DC | PRN

## 2018-12-29 NOTE — Telephone Encounter (Signed)
I called the patient using pacific interpreter [phone]) Meghan Reeves [phone] and had a lengthy conversation discussing her Rizatriptan instructions, new medication reglan. She is aware not to take the Rizatriptan daily and not to take more than 2 tabs in 24 hours. I answered all of her questions. She stated that the rizatriptan is saved only for when she feels a "big migraine" coming on but she wants to know what else she can do for smaller headaches. She stated she might go 3 days without any pain and then have a headache for 2 days. We discussed Reglan, dosage, instructions for use. Relgan 10 mg three times daily as needed for headache, nausea. She is aware not to take this daily and there is a limit of 15 pills per month. Discussed common side effects incl. dizziness, drowsiness, diarrhea, tiredness. Advised there are other potential side effects including serious s/e such as abnormal muscle movements and depression. Advised pt for any questions or s/e call our office. For any serious side effects, call 911. She asked if it was ok to take the reglan while she is on lexapro and hydroxyzine. I spoke with Meghan Reeves and advised pt that it is ok as long as she is only taking the reglan as needed and not everyday. Advised pt to call with any concerns. She asked about her eyes turning red after she takes cold medicine. I advised that she needed to call PCP Meghan Reeves about this and she has a f/u appt scheduled with PCP on 01/05/2019 @ 3:10 pm. I gave her PCP number to call. I also advised that she has a f/u appt with Meghan Reeves on 06/07/2019 at 3:00 and if she needs a sooner appointment she should call the interpreter line to call our office to schedule. I gave her the number for the pacific interpreter line and the [place] interpreter lines for assistance when needing to contact our office. She verbalized appreciation and understanding and had no further questions.   Reglan e-scribed to [facility] on [name]  per her request.

## 2019-01-04 ENCOUNTER — Emergency Department (HOSPITAL_COMMUNITY): Payer: Self-pay

## 2019-01-04 ENCOUNTER — Emergency Department (HOSPITAL_COMMUNITY)
Admission: EM | Admit: 2019-01-04 | Discharge: 2019-01-04 | Disposition: A | Discharge status: Home / Self Care | Payer: Self-pay | Attending: Emergency Medicine | Admitting: Emergency Medicine

## 2019-01-04 ENCOUNTER — Encounter: Payer: Self-pay | Admitting: Emergency Medicine

## 2019-01-04 DIAGNOSIS — K8050 Calculus of bile duct without cholangitis or cholecystitis without obstruction: Secondary | ICD-10-CM | POA: Insufficient documentation

## 2019-01-04 DIAGNOSIS — K439 Ventral hernia without obstruction or gangrene: Secondary | ICD-10-CM | POA: Insufficient documentation

## 2019-01-04 DIAGNOSIS — R10816 Epigastric abdominal tenderness: Secondary | ICD-10-CM | POA: Insufficient documentation

## 2019-01-04 DIAGNOSIS — R110 Nausea: Secondary | ICD-10-CM | POA: Insufficient documentation

## 2019-01-04 DIAGNOSIS — R1011 Right upper quadrant pain: Secondary | ICD-10-CM

## 2019-01-04 DIAGNOSIS — F17210 Nicotine dependence, cigarettes, uncomplicated: Secondary | ICD-10-CM | POA: Insufficient documentation

## 2019-01-04 LAB — URINALYSIS, ROUTINE W REFLEX MICROSCOPIC
Bilirubin Urine: NEGATIVE
GLUCOSE, UA: NEGATIVE mg/dL
Hgb urine dipstick: NEGATIVE
Ketones, ur: NEGATIVE mg/dL
Leukocytes,Ua: NEGATIVE
Nitrite: NEGATIVE
Protein, ur: NEGATIVE mg/dL
Specific Gravity, Urine: 1.021 (ref 1.005–1.030)
pH: 5 (ref 5.0–8.0)

## 2019-01-04 LAB — COMPREHENSIVE METABOLIC PANEL
ALT: 35 U/L (ref 0–44)
AST: 46 U/L — ABNORMAL HIGH (ref 15–41)
Albumin: 3.7 g/dL (ref 3.5–5.0)
Alkaline Phosphatase: 72 U/L (ref 38–126)
Anion gap: 11 (ref 5–15)
BUN: 15 mg/dL (ref 6–20)
CO2: 22 mmol/L (ref 22–32)
Calcium: 9.3 mg/dL (ref 8.9–10.3)
Chloride: 102 mmol/L (ref 98–111)
Creatinine, Ser: 0.95 mg/dL (ref 0.44–1.00)
GFR calc non Af Amer: >60 mL/min (ref >60)
Glucose, Bld: 130 mg/dL — ABNORMAL HIGH (ref 70–99)
Potassium: 3.8 mmol/L (ref 3.5–5.1)
Sodium: 135 mmol/L (ref 135–145)
Total Bilirubin: 0.5 mg/dL (ref 0.3–1.2)
Total Protein: 6.9 g/dL (ref 6.5–8.1)

## 2019-01-04 LAB — CBC
HEMATOCRIT: 41 % (ref 36.0–46.0)
Hemoglobin: 13.9 g/dL (ref 12.0–15.0)
MCH: 31 pg (ref 26.0–34.0)
MCHC: 33.9 g/dL (ref 30.0–36.0)
MCV: 91.5 fL (ref 80.0–100.0)
Platelets: 285 10*3/uL (ref 150–400)
RBC: 4.48 MIL/uL (ref 3.87–5.11)
RDW: 11.6 % (ref 11.5–15.5)
WBC: 14.3 10*3/uL — AB (ref 4.0–10.5)
nRBC: 0 % (ref 0.0–0.2)

## 2019-01-04 LAB — POC URINE PREG, ED: Preg Test, Ur: NEGATIVE

## 2019-01-04 LAB — LIPASE, BLOOD: Lipase: 28 U/L (ref 11–51)

## 2019-01-04 MED ORDER — IBUPROFEN 400 MG PO TABS
600.0000 mg | ORAL_TABLET | Freq: Once | ORAL | Status: AC
  Administered 2019-01-04: 600 mg via ORAL
  Filled 2019-01-04: qty 1

## 2019-01-04 MED ORDER — ONDANSETRON 4 MG PO TBDP
4.0000 mg | ORAL_TABLET | Freq: Once | ORAL | Status: AC
  Administered 2019-01-04: 4 mg via ORAL
  Filled 2019-01-04: qty 1

## 2019-01-04 MED ORDER — SODIUM CHLORIDE 0.9% FLUSH: 3.0000 mL | Freq: Once | INTRAVENOUS | Status: DC

## 2019-01-04 MED ORDER — OXYCODONE-ACETAMINOPHEN 5-325 MG PO TABS: 2.0000 | ORAL_TABLET | ORAL | 0 refills | Status: DC | PRN

## 2019-01-04 MED ORDER — ONDANSETRON HCL 4 MG PO TABS: 4.0000 mg | ORAL_TABLET | Freq: Four times a day (QID) | ORAL | 0 refills | Status: DC

## 2019-01-04 NOTE — ED Provider Notes (Addendum)
[facility]ERGENCY DEPARTMENT Provider Note   CSN: [id number] Arrival date & time: 01/04/19  0122    History   Chief Complaint Chief Complaint  Patient presents with  . Abdominal Pain    HPI Meghan Reeves is a 38 y.o. female with a history of migraines who presents to the emergency department with a chief complaint of upper abdominal pain.  The patient endorses right-sided upper abdominal pain that radiates to her right mid back that is been constant since onset at 11:30 PM.  She is unable to characterize the pain.  States the pain is currently at a 2 out of 10, but reports that when it was more severe after it first started that she was feeling nauseated.  No treatment prior to arrival.  She reports previous episodes of similar pain.  She reports the episodes of pain are associated with eating. She had fried chicken wings for dinner.  She denies fever, chills, vomiting, hematemesis, diarrhea, melena, hematochezia, chest pain, dysuria, hematuria, urinary frequency or hesitancy, vaginal pain, discharge, or itching, shortness of breath, cough, or body aches.  Surgical history includes cesarean section and appendectomy.     The history is provided by the patient. No language interpreter was used.    History reviewed. No pertinent past medical history.  Patient Active Problem List   Diagnosis Date Noted  . Tobacco use disorder, continuous 02/03/2017  . Encounter for smoking cessation counseling 02/03/2017  . [redacted] weeks gestation of pregnancy 02/03/2017  . Less than [redacted] weeks gestation of pregnancy 12/23/2016  . Initiation of Depo Provera 07/29/2015    Past Surgical History:  Procedure Laterality Date  . APPENDECTOMY  09/2016     OB History   No obstetric history on file.      Home Medications    Prior to Admission medications   Medication Sig Start Date End Date Taking? Authorizing Provider  acetaminophen (TYLENOL) 325 MG tablet Take 650 mg by  mouth every 6 (six) hours as needed for mild pain.   Yes [provider]  ibuprofen (ADVIL,MOTRIN) 200 MG tablet Take 200 mg by mouth every 6 (six) hours as needed for mild pain.   Yes [provider]  nicotine (NICODERM CQ) 7 mg/24hr patch Place 1 patch (7 mg total) onto the skin daily. Patient not taking: Reported on 01/04/2019 02/03/17   Horald Pollen, MD  ondansetron (ZOFRAN) 4 MG tablet Take 1 tablet (4 mg total) by mouth every 6 (six) hours. 01/04/19   McDonald, Mia A, PA-C  oxyCODONE-acetaminophen (PERCOCET/ROXICET) 5-325 MG tablet Take 2 tablets by mouth every 4 (four) hours as needed for severe pain. 01/04/19   McDonald, Mia A, PA-C    Family History No family history on file.  Social History Social History   Tobacco Use  . Smoking status: Current Every Day Smoker  . Smokeless tobacco: Never Used  Substance Use Topics  . Alcohol use: No    Alcohol/week: 0.0 standard drinks  . Drug use: No     Allergies   Patient has no known allergies.   Review of Systems Review of Systems  Constitutional: Negative for activity change, chills and fever.  HENT: Negative for congestion.   Respiratory: Negative for shortness of breath.   Cardiovascular: Negative for chest pain, palpitations and leg swelling.  Gastrointestinal: Positive for abdominal pain and nausea. Negative for anal bleeding, blood in stool, constipation, diarrhea and vomiting.  Genitourinary: Negative for dysuria, vaginal discharge and vaginal pain.  Musculoskeletal: Negative for arthralgias, back pain, myalgias and neck pain.  Skin: Negative for rash.  Allergic/Immunologic: Negative for immunocompromised state.  Neurological: Negative for syncope, weakness, numbness and headaches.  Psychiatric/Behavioral: Negative for confusion.     Physical Exam Updated Vital Signs BP 114/82   Pulse 79   Temp 97.6 F (36.4 C) (Oral)   Resp 17   Ht 5\' 5"  (1.651 m)   Wt 72.6 kg   LMP  (Exact Date)    SpO2 100%   BMI 26.63 kg/m   Physical Exam Vitals signs and nursing note reviewed.  Constitutional:      General: She is not in acute distress. HENT:     Head: Normocephalic.  Eyes:     Conjunctiva/sclera: Conjunctivae normal.  Neck:     Musculoskeletal: Neck supple.  Cardiovascular:     Rate and Rhythm: Normal rate and regular rhythm.     Heart sounds: Normal heart sounds. No murmur. No friction rub. No gallop.   Pulmonary:     Effort: Pulmonary effort is normal. No respiratory distress.     Breath sounds: Normal breath sounds. No stridor. No wheezing, rhonchi or rales.  Chest:     Chest wall: No tenderness.  Abdominal:     General: Bowel sounds are normal. There is no distension.     Palpations: Abdomen is soft.     Tenderness: There is abdominal tenderness in the right upper quadrant and epigastric area. There is no right CVA tenderness, left CVA tenderness, guarding or rebound. Negative signs include Murphy's sign and McBurney's sign.     Hernia: A hernia is present. Hernia is present in the ventral area.     Comments: Tender to palpation in the epigastric and right upper quadrant without rebound or guarding.  Negative Murphy sign.  Normoactive bowel signs.  There is a small, easily reducible ventral hernia in the periumbilical region.  No overlying pain or erythema.  Skin:    General: Skin is warm.     Findings: No rash.  Neurological:     Mental Status: She is alert.  Psychiatric:        Behavior: Behavior normal.      ED Treatments / Results  Labs (all labs ordered are listed, but only abnormal results are displayed) Labs Reviewed  COMPREHENSIVE METABOLIC PANEL - Abnormal; Notable for the following components:      Result Value   Glucose, Bld 130 (*)    AST 46 (*)    All other components within normal limits  CBC - Abnormal; Notable for the following components:   WBC 14.3 (*)    All other components within normal limits  LIPASE, BLOOD  URINALYSIS, ROUTINE W  REFLEX MICROSCOPIC  POC URINE PREG, ED    EKG None  Radiology US Abdomen Limited Ruq  Result Date: 01/04/2019 CLINICAL DATA:  38 year old female with right upper quadrant abdominal pain. EXAM: ULTRASOUND ABDOMEN LIMITED RIGHT UPPER QUADRANT COMPARISON:  None. FINDINGS: Gallbladder: There multiple stones within gallbladder. No gallbladder wall thickening or pericholecystic fluid. Negative sonographic Murphy's sign. Common bile duct: Diameter: 5 mm Liver: Slightly increased liver echogenicity, likely mild fatty infiltration. Portal vein is patent on color Doppler imaging with normal direction of blood flow towards the liver. IMPRESSION: Cholelithiasis without sonographic evidence of acute cholecystitis. Electronically Signed   By: Anner Crete M.D.   On: 01/04/2019 04:52    Procedures Procedures (including critical care time)  Medications Ordered in ED Medications  sodium chloride flush (NS)  0.9 % injection 3 mL (has no administration in time range)  ondansetron (ZOFRAN-ODT) disintegrating tablet 4 mg (4 mg Oral Given 01/04/19 0552)  ibuprofen (ADVIL,MOTRIN) tablet 600 mg (600 mg Oral Given 01/04/19 9702)     Initial Impression / Assessment and Plan / ED Course  I have reviewed the triage vital signs and the nursing notes.  Pertinent labs & imaging results that were available during my care of the patient were reviewed by me and considered in my medical decision making (see chart for details).  38 year old female with a history of migraines presenting with sudden onset upper abdominal pain and nausea.  Symptoms are associated with eating.  On exam, she is tender to palpation without rebound or guarding in the epigastric and right upper quadrant.  Presentation is concerning for biliary colic.  Will order basic labs and right upper quadrant ultrasound.  Patient declines medication for pain or nausea as her pain is currently 2 out of 10.  Labs are notable for AST of 46 and WBC of 14.3.   Labs are otherwise unremarkable.  Right upper quadrant ultrasound with cholelithiasis, but no signs of choledocholithiasis or cholecystitis.  Presentation could be concerning for early presentation of cholecystitis, but given that she has not required any pain control and abdominal exam is reassuring we will plan for outpatient follow-up.  Labs and ultrasound findings discussed with the patient.  She reports that she does not feel comfortable going home at this time as her pain is a 7 out of 10.  She has driven herself to the ER so I will give her a dose of ibuprofen and Zofran and plan to reassess.  Clinical Course as of Jan 04 703  Wed Jan 04, 2019  0655 Patient recheck.  She reports that her pain is now 1 out of 10 and she feels much improved.  She is ready for discharge to home.  Will discharge the patient with a short course of Percocet and Zofran.  Referral to surgery has been given.   [MM]    Clinical Course User Index [MM] McDonald, Mia A, PA-C    A 33-month prescription history query was performed using the [place] CSRS prior to discharge.  She has been given strict return precautions.  She is hemodynamically stable and in no acute distress.  Safe for discharge home with outpatient follow-up at this time.      Final Clinical Impressions(s) / ED Diagnoses   Final diagnoses:  RUQ pain  Biliary colic    ED Discharge Orders         Ordered    oxyCODONE-acetaminophen (PERCOCET/ROXICET) 5-325 MG tablet  Every 4 hours PRN     01/04/19 0645    ondansetron (ZOFRAN) 4 MG tablet  Every 6 hours     01/04/19 0645           McDonald, Maree Erie A, PA-C 01/04/19 0659    Ward, Delice Bison, DO 01/04/19 0659    McDonald, Mia A, PA-C 01/04/19 0703    McDonald, Mia A, PA-C 01/04/19 0704    Ward, Delice Bison, DO 01/04/19 6378

## 2019-01-04 NOTE — Discharge Instructions (Addendum)
Gracias por permitirme cuidardelo hoy en el Departamento de Emergencias.   Llame para programar una cita de seguimiento con [employer].  He adjuntado instrucciones sobre una dieta reducida en grasas y colesterol.  Seguir una dieta baja en grasas debera ayudar a mejorar los sntomas.  Para el control del dolor, puede tomar 600 mg de ibuprofeno con alimentos o 650 mg de Tylenol una vez cada 6 horas o alternar entre estos 2 medicamentos cada 3 horas.  Para dolor intenso e incontrolable, puede tomar 1 comprimido de Percocet una vez cada 6 horas.  No trabaje ni conduzca mientras est tomando este medicamento porque puede causar [employer].  Si presenta fiebre, dolor abdominal intenso, vmitos persistentes a pesar de tomar Zofran u otros sntomas nuevos, con respecto a los sntomas, vuelva al departamento de emergencias para una evaluacin adicional.  Thank you for allowing me to care for you today in the Emergency Department.   Call to schedule a follow-up appointment with [facility] Surgery.    I have attached instructions on a fat and cholesterol reduced diet.  Following a low-fat diet should help to improve your symptoms.  For pain control, you may take 600 mg of ibuprofen with food or 650 mg of Tylenol once every 6 hours or alternate between these 2 medications every 3 hours.  For severe, uncontrollable pain, you may take 1 tablet of Percocet once every 6 hours.  Do not work or drive while taking this medication because it can cause you to be impaired.  If you develop a fever, severe abdominal pain, persistent vomiting despite taking Zofran, or other new, concerning symptoms, please return to the emergency department for further evaluation.

## 2019-01-04 NOTE — ED Triage Notes (Signed)
Pt c/o epigastric pain radiating through to back, onset approx 2 hours ago.   Pt denies any nausea or vomiting

## 2019-01-04 NOTE — ED Notes (Signed)
Patient verbalizes understanding of discharge instructions. Opportunity for questioning and answers were provided. Armband removed by staff, pt discharged from ED.  

## 2019-01-05 ENCOUNTER — Encounter: Payer: Self-pay | Admitting: Family Medicine

## 2019-01-05 ENCOUNTER — Ambulatory Visit (INDEPENDENT_AMBULATORY_CARE_PROVIDER_SITE_OTHER): Payer: Self-pay | Admitting: Family Medicine

## 2019-01-05 VITALS — BP 107/75 | HR 74 | Resp 17 | Ht 61.0 in | Wt 166.2 lb

## 2019-01-05 DIAGNOSIS — K8050 Calculus of bile duct without cholangitis or cholecystitis without obstruction: Secondary | ICD-10-CM

## 2019-01-05 DIAGNOSIS — F411 Generalized anxiety disorder: Secondary | ICD-10-CM

## 2019-01-05 DIAGNOSIS — Z13220 Encounter for screening for lipoid disorders: Secondary | ICD-10-CM

## 2019-01-05 NOTE — Patient Instructions (Signed)
Seguimiento con [place] para programar la evaluacin de la vescula biliar.   Plan de alimentacin para problemas de vescula biliar Gallbladder Eating Plan Si tiene una afeccin de la vescula biliar, puede tener problemas para digerir las grasas. Consumir una dieta con bajo contenido de grasas puede [employer] sntomas, y puede ser beneficiosa antes y despus de [place] de extraccin de vescula biliar (colecistectoma). El mdico puede recomendarle que trabaje con un especialista en dietas y alimentacin (nutricionista) para que lo ayude a reducir la cantidad de grasas en su dieta. Consejos para seguir este plan [place] generales  Limite el consumo de grasas a menos del 30% del total de caloras diarias. Si usted ingiere alrededor de 1800 caloras diarias, esto es menos de 60 gramos (g) de Materials engineer.  La grasa es una parte importante de una dieta saludable. Consumir una dieta con bajo contenido de grasas puede dificultar mantener un peso corporal saludable. Pregunte a su nutricionista qu cantidad de grasas, caloras y otros nutrientes necesita diariamente.  Haga comidas pequeas y frecuentes Medical sales representative de tres comidas abundantes.  Beba de 8 a 10 vasos de lquido por [employer]. Beba suficiente lquido como para mantener la orina clara o de color amarillo plido.  Limite el consumo de alcohol a no ms de 69medida por da si es mujer y no est [employer], y 67medidas por da si es hombre. Una medida equivale a 12oz (330ml) de cerveza, 5oz (175ml) de vino o 1oz (39ml) de bebidas alcohlicas de alta graduacin. Lea las etiquetas de los alimentos  Consulte la informacin nutricional en las etiquetas de los alimentos para conocer la cantidad de grasas por porcin. Elija alimentos con menos de 3 gramos de grasas por porcin. De compras  Elija alimentos saludables sin grasas o con bajo contenido de [place]. Busque las palabras "sin grasa",  "bajo en grasas" o "con bajo contenido de grasas".  Evite comprar alimentos procesados o envasados. Coccin  Para cocinar opte por mtodos con bajo contenido de grasa, como hornear, hervir, Interior and spatial designer y Holiday representative.  Cocine con pequeas cantidades de grasas saludables, como aceite de [place], aceite de semilla de [place], aceite de canola o [place]. Qu alimentos se recomiendan?   [place] y verduras frescas, congeladas o enlatadas.  Cereales integrales.  Leche y yogur semidescremados y descremados.  Lysbeth Galas, aves sin piel, pescado, huevos y legumbres.  Suplementos proteicos con bajo contenido de grasas, en polvo o lquidos.  Hierbas y especias. Qu alimentos no se recomiendan?  Alimentos muy grasos. Entre estos se incluyen productos panificados, comida rpida, cortes de carne con grasa, helados, pan francs, rosquillas dulces, pizza, pan de queso, alimentos cubiertos con [employer], salsas con crema o queso.  Comidas fritas. Se incluyen papas fritas, tempura, pescado rebozado, milanesas de pollo, panes fritos y dulces.  Alimentos con [employer].  Alimentos que causan gases o meteorismo. Resumen  Una dieta de bajo contenido graso puede ser beneficiosa si tiene una afeccin de la vescula biliar o puede hacerla antes y despus de someterse a una ciruga de vescula.  Limite el consumo de grasas a menos del 30% del total de caloras diarias. Esto es casi 60 gramos de grasa si usted ingiere 1800 caloras diarias.  Haga comidas pequeas y frecuentes Medical sales representative de tres comidas abundantes. Esta informacin no tiene Marine scientist el consejo del mdico. Asegrese de hacerle al mdico cualquier pregunta que tenga. Document Released: 10/31/2013 Document Revised:  06/01/2017 Document Reviewed: 06/01/2017 Elsevier Interactive Patient Education  [employer].

## 2019-01-05 NOTE — Progress Notes (Signed)
Patient ID: Meghan Reeves, female    DOB: 01/20/1981, 38 y.o.   MRN: [id number]  PCP: Scot Jun, FNP  Chief Complaint  Patient presents with  . Anxiety    Subjective:  HPI  Meghan Reeves is a 38 y.o. female presents for evaluation anxiety.  Office note from 11/24/18:Anxiety and stress Feels stress and anxiety mostly with life stressors and being a mom. She is a stay at home mom. Suffers from worry and fear that something will happen to her baby. Endorses family support, however feels she would benefit from medication. Endorses difficulty sleeping and fatigue. Anxiety is not impacting appetite. Current Body mass index is 30 kg/m. She engages in no routine physical exercise. She doesn't drink alcohol. No prior use of illicit drugs.  Follow-up Anxiety and Stress: Patient reports today improvement of anxiety symptoms. GAD-7 remains unchanged at 5. She reports stopping Lexapro as medication cause severe daytime sleepiness. She continues to take hydroxyzine at bedtime to facilitate sleep. Reports hydroxyzine is working well and she is no having a hangover effect the following day. She suffers from chronic migraines and is now followed by neurology for management of headaches.   During her visit today, patient reported presenting to the ER yesterday. Further evaluation revealed patient has two EMR under two different names. Reviewed other chart Genene Churn and patient was seen at ER with a complaint of abdominal pain and was found to have biliary colic and referred to [facility]. US abdominal ultrasound impression was as follows: IMPRESSION: Cholelithiasis without sonographic evidence of acute cholecystitis. Patient reports that she is planning to call to schedule appointment. Abdominal pain has improved.  Social History   Socioeconomic History  . Marital status: Significant Other    Spouse name: Not on file  . Number of children: 1  . Years of education: Not on  file  . Highest education level: 6th grade  Occupational History  . Not on file  Social Needs  . Financial resource strain: Not on file  . Food insecurity:    Worry: Not on file    Inability: Not on file  . Transportation needs:    Medical: Not on file    Non-medical: Not on file  Tobacco Use  . Smoking status: Former Smoker    Packs/day: 0.25    Years: 10.00    Pack years: 2.50    Types: Cigarettes  . Smokeless tobacco: Never Used  Substance and Sexual Activity  . Alcohol use: No    Alcohol/week: 0.0 standard drinks  . Drug use: No  . Sexual activity: Yes    Birth control/protection: None  Lifestyle  . Physical activity:    Days per week: Not on file    Minutes per session: Not on file  . Stress: Not on file  Relationships  . Social connections:    Talks on phone: Not on file    Gets together: Not on file    Attends religious service: Not on file    Active member of club or organization: Not on file    Attends meetings of clubs or organizations: Not on file    Relationship status: Not on file  . Intimate partner violence:    Fear of current or ex partner: Not on file    Emotionally abused: Not on file    Physically abused: Not on file    Forced sexual activity: Not on file  Other Topics Concern  . Not on file  Social History Narrative  Lives at home with boyfriend    Right handed   Caffeine: 30 oz daily of coca cola    Family History  Problem Relation Age of Onset  . Asthma Mother   . Migraines Sister    Review of Systems  Pertinent negatives listed in HPI  Patient Active Problem List   Diagnosis Date Noted  . Migraine without aura and with status migrainosus, not intractable 12/08/2018  . Medication overuse headache 12/08/2018  . Mixed headache 10/21/2018  . Post-dates pregnancy 09/06/2017  . Spanish speaking patient 10/01/2016  . Appendicitis, acute, with peritonitis s/p lap appendectomy 09/29/2016 09/29/2016    Allergies  Allergen Reactions   . Topamax [Topiramate] Other (See Comments)    Blurry vision, numbness of lips    Prior to Admission medications   Medication Sig Start Date End Date Taking? Authorizing Provider  escitalopram (LEXAPRO) 10 MG tablet Take 1 tablet (10 mg total) by mouth at bedtime. 11/24/18  Yes Scot Jun, FNP  etonogestrel (NEXPLANON) 68 MG IMPL implant 1 each by Subdermal route once.   Yes [provider]  Fremanezumab-vfrm (AJOVY) 225 MG/1.5ML SOSY Inject 1.5 mLs into the skin every 30 (thirty) days. 12/07/18  Yes Lomax, Amy, NP  metoCLOPramide (REGLAN) 10 MG tablet Take 1 tablet (10 mg total) by mouth 3 (three) times daily as needed for nausea (or headache). 12/29/18  Yes Melvenia Beam, MD  rizatriptan (MAXALT) 10 MG tablet Take 1 tablet (10 mg total) by mouth as needed for migraine. May repeat in 2 hours if needed 12/07/18  Yes Lomax, Amy, NP  hydrOXYzine (ATARAX/VISTARIL) 25 MG tablet Take 1 tablet (25 mg total) by mouth every 8 (eight) hours as needed (sleeping). Patient not taking: Reported on 01/05/2019 11/24/18   Scot Jun, FNP    Past Medical, Surgical Family and Social History reviewed and updated.    Objective:   Today's Vitals   01/05/19 1527  BP: 107/75  Pulse: 74  Resp: 17  SpO2: 97%  Weight: 166 lb 3.2 oz (75.4 kg)  Height: 5\' 1"  (1.549 m)    Wt Readings from Last 3 Encounters:  01/05/19 166 lb 3.2 oz (75.4 kg)  12/07/18 163 lb (73.9 kg)  11/24/18 158 lb 12.8 oz (72 kg)     Physical Exam General appearance: alert, well developed, well nourished, cooperative and in no distress Head: Normocephalic, without obvious abnormality, atraumatic Respiratory: Respirations even and unlabored, normal respiratory rate Heart: rate and rhythm normal. No gallop or murmurs noted on exam  Abdomen: BS +, no distention, no rebound tenderness, or no mass Extremities: No gross deformities Skin: Skin color, texture, turgor normal. No rashes seen  Psych: Appropriate mood  and affect. Neurologic: Mental status: Alert, oriented to person, place, and time, thought content appropriate.   No results found for: POCGLU  Lab Results  Component Value Date   HGBA1C 5.4 11/24/2018            Assessment & Plan:  1. Screening, lipid - Lipid panel  2. GAD (generalized anxiety disorder) -improved symptoms -continue hydroxyzine 25 mg at bedtime as needed for sleep and decrease anxiousness at bedtime.  3. Calculus of bile duct without cholecystitis and without obstruction -Patient referred to [facility] by ER for follow-up -Encouraged to schedule an appointment    -The patient was given clear instructions to go to ER or return to medical center if symptoms do not improve, worsen or new problems develop. The patient verbalized understanding.  Molli Barrows, FNP Primary Care at [facility] [address], [place] [phone]: [phone]

## 2019-01-06 LAB — LIPID PANEL
Chol/HDL Ratio: 4.4 ratio (ref 0.0–4.4)
Cholesterol, Total: 209 mg/dL — ABNORMAL HIGH (ref 100–199)
HDL: 48 mg/dL (ref >39)
LDL CALC: 123 mg/dL — AB (ref 0–99)
Triglycerides: 188 mg/dL — ABNORMAL HIGH (ref 0–149)
VLDL CHOLESTEROL CAL: 38 mg/dL (ref 5–40)

## 2019-01-09 MED ORDER — HYDROXYZINE HCL 25 MG PO TABS: 25.0000 mg | ORAL_TABLET | Freq: Every evening | ORAL | 0 refills | Status: DC | PRN

## 2019-01-18 ENCOUNTER — Telehealth: Payer: Self-pay | Admitting: Family Medicine

## 2019-01-18 MED ORDER — HYDROXYZINE HCL 25 MG PO TABS: 25.0000 mg | ORAL_TABLET | Freq: Every evening | ORAL | 0 refills | Status: DC | PRN

## 2019-01-18 NOTE — Telephone Encounter (Signed)
Rx sent to [facility].

## 2019-01-18 NOTE — Telephone Encounter (Signed)
Patient called stating that she went to the [facility] pharmacy to pick up Hydroxyzine but they did not receive prescription.   Called [facility] pharmacy and they confirmed they did not receive a prescription for hydrOXYzine (ATARAX/VISTARIL) 25 MG tablet [[id number]] on March 02,2020.   Please follow up.

## 2019-01-18 NOTE — Telephone Encounter (Signed)
Called patient

## 2019-01-18 NOTE — Progress Notes (Signed)
Patient notified of results & recommendations. Expressed understanding.

## 2019-02-15 ENCOUNTER — Ambulatory Visit (HOSPITAL_COMMUNITY)
Admission: EM | Admit: 2019-02-15 | Discharge: 2019-02-15 | Disposition: A | Discharge status: Home / Self Care | Payer: Self-pay | Attending: Family Medicine | Admitting: Family Medicine

## 2019-02-15 ENCOUNTER — Other Ambulatory Visit: Payer: Self-pay

## 2019-02-15 ENCOUNTER — Encounter (HOSPITAL_COMMUNITY): Payer: Self-pay | Admitting: Emergency Medicine

## 2019-02-15 DIAGNOSIS — R110 Nausea: Secondary | ICD-10-CM

## 2019-02-15 DIAGNOSIS — R112 Nausea with vomiting, unspecified: Secondary | ICD-10-CM

## 2019-02-15 DIAGNOSIS — Z3202 Encounter for pregnancy test, result negative: Secondary | ICD-10-CM

## 2019-02-15 LAB — POCT PREGNANCY, URINE: Preg Test, Ur: NEGATIVE

## 2019-02-15 MED ORDER — ONDANSETRON 4 MG PO TBDP: 4.0000 mg | ORAL_TABLET | Freq: Three times a day (TID) | ORAL | 0 refills | Status: DC | PRN

## 2019-02-15 NOTE — Discharge Instructions (Signed)
[place] zofran si necesita para nausea Evita comidas malo para vescilua- lee los papels detras  Regrese si tiene mas vomitos, fiebre, Social research officer, government de [employer]

## 2019-02-15 NOTE — ED Triage Notes (Signed)
Pt reports 5 days of nausea with 1 episode of vomiting.  Pt states she has the Nexplanon but wanted a pregnancy test.

## 2019-02-15 NOTE — ED Provider Notes (Signed)
[facility]    CSN: [id number] Arrival date & time: 02/15/19  1250     History   Chief Complaint Chief Complaint  Patient presents with  . Nausea  . Possible Pregnancy    HPI Meghan Reeves is a 38 y.o. female history of previous appendectomy presenting today for evaluation of nausea.  Patient states that for the past 5 days she has had some nausea.  She had one episode of vomiting.  Otherwise she has been eating and drinking like normal.  Patient is concerned about pregnancy.  Patient states that she has had Nexplanon and has had this in for approximately 1.5 years but still would like a pregnancy test.  She does not have regular menstrual cycles on the Nexplanon and does not have spotting.  She states every 3 months she will have a very small amount of bleeding.  She denies any abdominal pain.  Does note that she has history of gallstones, and that her symptoms may be related to this.  She also notes she occasionally has a bitter taste in mouth.  HPI  Past Medical History:  Diagnosis Date  . Asthma, mild   . Medical history non-contributory     Patient Active Problem List   Diagnosis Date Noted  . Migraine without aura and with status migrainosus, not intractable 12/08/2018  . Medication overuse headache 12/08/2018  . Mixed headache 10/21/2018  . Post-dates pregnancy 09/06/2017  . Spanish speaking patient 10/01/2016  . Appendicitis, acute, with peritonitis s/p lap appendectomy 09/29/2016 09/29/2016    Past Surgical History:  Procedure Laterality Date  . CESAREAN SECTION N/A 09/07/2017   Procedure: CESAREAN SECTION;  Surgeon: Mora Bellman, MD;  Location: [facility];  Service: Obstetrics;  Laterality: N/A;  . LAPAROSCOPIC APPENDECTOMY N/A 09/29/2016   Procedure: APPENDECTOMY LAPAROSCOPIC;  Surgeon: Greer Pickerel, MD;  Location: [facility];  Service: General;  Laterality: N/A;  . NO PAST SURGERIES      OB History    Gravida  2   Para  1   Term  1   Preterm  0   AB  1   Living  1     SAB  1   TAB  0   Ectopic  0   Multiple  0   Live Births  1            Home Medications    Prior to Admission medications   Medication Sig Start Date End Date Taking? Authorizing Provider  etonogestrel (NEXPLANON) 68 MG IMPL implant 1 each by Subdermal route once.   Yes [provider]  Fremanezumab-vfrm (AJOVY) 225 MG/1.5ML SOSY Inject 1.5 mLs into the skin every 30 (thirty) days. 12/07/18  Yes Lomax, Amy, NP  metoCLOPramide (REGLAN) 10 MG tablet Take 1 tablet (10 mg total) by mouth 3 (three) times daily as needed for nausea (or headache). 12/29/18  Yes Melvenia Beam, MD  rizatriptan (MAXALT) 10 MG tablet Take 1 tablet (10 mg total) by mouth as needed for migraine. May repeat in 2 hours if needed 12/07/18  Yes Lomax, Amy, NP  hydrOXYzine (ATARAX/VISTARIL) 25 MG tablet Take 1 tablet (25 mg total) by mouth at bedtime and may repeat dose one time if needed. 01/18/19   Scot Jun, FNP  ondansetron (ZOFRAN ODT) 4 MG disintegrating tablet Take 1 tablet (4 mg total) by mouth every 8 (eight) hours as needed for nausea or vomiting. 02/15/19   Bostyn Kunkler, Elesa Hacker, PA-C    Family History  Family History  Problem Relation Age of Onset  . Asthma Mother   . Migraines Sister     Social History Social History   Tobacco Use  . Smoking status: Former Smoker    Packs/day: 0.25    Years: 10.00    Pack years: 2.50    Types: Cigarettes  . Smokeless tobacco: Never Used  Substance Use Topics  . Alcohol use: No    Alcohol/week: 0.0 standard drinks  . Drug use: No     Allergies   Topamax [topiramate]   Review of Systems Review of Systems  Constitutional: Negative for activity change, appetite change, fatigue and fever.  Respiratory: Negative for shortness of breath.   Cardiovascular: Negative for chest pain.  Gastrointestinal: Positive for nausea and vomiting. Negative for abdominal pain and diarrhea.  Genitourinary: Negative  for dysuria, flank pain, genital sores, hematuria, menstrual problem, vaginal bleeding, vaginal discharge and vaginal pain.  Musculoskeletal: Negative for back pain.  Skin: Negative for rash.  Neurological: Negative for dizziness, light-headedness and headaches.     Physical Exam Triage Vital Signs ED Triage Vitals  Enc Vitals Group     BP 02/15/19 1314 114/76     Pulse Rate 02/15/19 1314 87     Resp 02/15/19 1314 18     Temp 02/15/19 1314 98.7 F (37.1 C)     Temp Source 02/15/19 1314 Oral     SpO2 02/15/19 1314 97 %     Weight --      Height --      Head Circumference --      Peak Flow --      Pain Score 02/15/19 1312 0     Pain Loc --      Pain Edu? --      Excl. in [place]? --    No data found.  Updated Vital Signs BP 114/76 (BP Location: Right Arm)   Pulse 87   Temp 98.7 F (37.1 C) (Oral)   Resp 18   SpO2 97%   Visual Acuity Right Eye Distance:   Left Eye Distance:   Bilateral Distance:    Right Eye Near:   Left Eye Near:    Bilateral Near:     Physical Exam Vitals signs and nursing note reviewed.  Constitutional:      General: She is not in acute distress.    Appearance: She is well-developed.  HENT:     Head: Normocephalic and atraumatic.     Mouth/Throat:     Comments: Oral mucosa pink and moist, no tonsillar enlargement or exudate. Posterior pharynx patent and nonerythematous, no uvula deviation or swelling. Normal phonation. Eyes:     Conjunctiva/sclera: Conjunctivae normal.  Neck:     Musculoskeletal: Neck supple.  Cardiovascular:     Rate and Rhythm: Normal rate and regular rhythm.     Heart sounds: No murmur.  Pulmonary:     Effort: Pulmonary effort is normal. No respiratory distress.     Breath sounds: Normal breath sounds.     Comments: Breathing comfortably at rest, CTABL, no wheezing, rales or other adventitious sounds auscultated Abdominal:     Palpations: Abdomen is soft.     Tenderness: There is abdominal tenderness.     Comments:  Soft, nondistended, mild tenderness to palpation of epigastrium, nontender in bilateral upper and lower quadrants.  Negative Murphy's.  Skin:    General: Skin is warm and dry.  Neurological:     Mental Status: She is alert.  UC Treatments / Results  Labs (all labs ordered are listed, but only abnormal results are displayed) Labs Reviewed  POC URINE PREG, ED  POCT PREGNANCY, URINE    EKG None  Radiology No results found.  Procedures Procedures (including critical care time)  Medications Ordered in UC Medications - No data to display  Initial Impression / Assessment and Plan / UC Course  I have reviewed the triage vital signs and the nursing notes.  Pertinent labs & imaging results that were available during my care of the patient were reviewed by me and considered in my medical decision making (see chart for details).    Pregnancy negative.  Most likely nausea and vomiting related to reflux versus gallbladder/cholelithiasis.  Vital signs stable, negative peritoneal signs.  Will treat symptomatically and supportively with Zofran for nausea.  Discussed diet for reflux and gallbladder.  Also recommended omeprazole for 2-week trial to help with reflux.  Continue to monitor,Discussed strict return precautions. Patient verbalized understanding and is agreeable with plan.  Final Clinical Impressions(s) / UC Diagnoses   Final diagnoses:  Nausea  Negative pregnancy test     Discharge Instructions     [place] zofran si necesita para nausea Evita comidas malo para vescilua- lee los papels detras  Regrese si tiene mas vomitos, fiebre, dolor de [place]    ED Prescriptions    Medication Sig Dispense Auth. Provider   ondansetron (ZOFRAN ODT) 4 MG disintegrating tablet Take 1 tablet (4 mg total) by mouth every 8 (eight) hours as needed for nausea or vomiting. 20 tablet Ludivina Guymon, [place] C, PA-C     Controlled Substance Prescriptions [place] Controlled Substance Registry  consulted? Not Applicable   Janith Lima, [place] 02/15/19 1354

## 2019-02-16 ENCOUNTER — Encounter (HOSPITAL_COMMUNITY): Payer: Self-pay | Admitting: Emergency Medicine

## 2019-03-24 ENCOUNTER — Other Ambulatory Visit: Payer: Self-pay

## 2019-03-24 ENCOUNTER — Emergency Department (HOSPITAL_COMMUNITY)
Admission: EM | Admit: 2019-03-24 | Discharge: 2019-03-25 | Disposition: A | Discharge status: Home / Self Care | Payer: Self-pay | Attending: Emergency Medicine | Admitting: Emergency Medicine

## 2019-03-24 ENCOUNTER — Encounter (HOSPITAL_COMMUNITY): Payer: Self-pay | Admitting: *Deleted

## 2019-03-24 DIAGNOSIS — J45909 Unspecified asthma, uncomplicated: Secondary | ICD-10-CM | POA: Insufficient documentation

## 2019-03-24 DIAGNOSIS — M25511 Pain in right shoulder: Secondary | ICD-10-CM | POA: Insufficient documentation

## 2019-03-24 DIAGNOSIS — Z87891 Personal history of nicotine dependence: Secondary | ICD-10-CM | POA: Insufficient documentation

## 2019-03-24 DIAGNOSIS — R202 Paresthesia of skin: Secondary | ICD-10-CM | POA: Insufficient documentation

## 2019-03-24 MED ORDER — PREDNISONE 20 MG PO TABS: ORAL_TABLET | ORAL | 0 refills | Status: DC

## 2019-03-24 MED ORDER — NAPROXEN 250 MG PO TABS
500.0000 mg | ORAL_TABLET | Freq: Once | ORAL | Status: AC
  Administered 2019-03-25: 500 mg via ORAL
  Filled 2019-03-24: qty 2

## 2019-03-24 MED ORDER — METHOCARBAMOL 500 MG PO TABS: 500.0000 mg | ORAL_TABLET | Freq: Four times a day (QID) | ORAL | 0 refills | Status: DC

## 2019-03-24 NOTE — Discharge Instructions (Signed)
Please read and follow all provided instructions.  Your diagnoses today include:  1. Acute pain of right shoulder   2. Right hand paresthesia     Tests performed today include:  Vital signs - see below for your results today  Medications prescribed:   Robaxin (methocarbamol) - muscle relaxer medication  DO NOT drive or perform any activities that require you to be awake and alert because this medicine can make you drowsy.    Prednisone - steroid medicine   It is best to take this medication in the morning to prevent sleeping problems. If you are diabetic, monitor your blood sugar closely and stop taking Prednisone if blood sugar is over 300. Take with food to prevent stomach upset.   Take any prescribed medications only as directed.  Home care instructions:   Follow any educational materials contained in this packet  Please rest, use ice or heat on your back for the next several days  Do not lift, push, pull anything more than 10 pounds for the next week  Follow-up instructions: Please follow-up with your primary care provider in the next 1 week for further evaluation of your symptoms.   Return instructions:  SEEK IMMEDIATE MEDICAL ATTENTION IF YOU HAVE:  New numbness, tingling, weakness, or problem with the use of your arms or legs  Severe back pain not relieved with medications  Loss control of your bowels or bladder  Increasing pain in any areas of the body (such as chest or abdominal pain)  Shortness of breath, dizziness, or fainting.   Worsening nausea (feeling sick to your stomach), vomiting, fever, or sweats  Any other emergent concerns regarding your health   Additional Information:  Your vital signs today were: BP 130/74    Pulse 78    Temp 97.8 F (36.6 C) (Oral)    Resp 16    SpO2 99%  If your blood pressure (BP) was elevated above 135/85 this visit, please have this repeated by your doctor within one month. --------------

## 2019-03-24 NOTE — ED Triage Notes (Signed)
Via spanish interpreter, pt reporting right shoulder/arm pain and upper right back pain for several days, worse when she lifts her arm or moves her neck to the right. Denies injury.

## 2019-03-24 NOTE — ED Provider Notes (Signed)
[facility] EMERGENCY DEPARTMENT Provider Note   CSN: [id number] Arrival date & time: 03/24/19  2120    History   Chief Complaint Chief Complaint  Patient presents with  . Shoulder Pain    HPI Meghan Reeves is a 38 y.o. female.     Patient with history of migraine headaches, gallbladder disease, previous appendectomy --presents to the emergency department tonight with intermittent right shoulder pain with radiation into her right arm with associated tingling sensation in the right hand and forearm.  Spanish interpreter used to take history.  Pain is made somewhat worse with movement.  The tingling sensation is described as "ants crawling on my arm".  She denies any weakness in her hand and has not been dropping any items.  She denies any lower extremity symptoms including weakness or pain in her legs or trouble walking.  She has been using ginger tea at home which she thinks made her symptoms worse.  She also also had some pain in her right jaw which she thought was pinching a nerve causing her shoulder to hurt.  She has never had any surgeries on her neck or other neck problems.  She denies any chest pain or shortness of breath.  No fevers, nausea, vomiting, or diarrhea.  She denies any falls or other injuries.     Past Medical History:  Diagnosis Date  . Asthma, mild   . Medical history non-contributory     Patient Active Problem List   Diagnosis Date Noted  . Migraine without aura and with status migrainosus, not intractable 12/08/2018  . Medication overuse headache 12/08/2018  . Mixed headache 10/21/2018  . Post-dates pregnancy 09/06/2017  . Tobacco use disorder, continuous 02/03/2017  . Encounter for smoking cessation counseling 02/03/2017  . [redacted] weeks gestation of pregnancy 02/03/2017  . Less than [redacted] weeks gestation of pregnancy 12/23/2016  . Spanish speaking patient 10/01/2016  . Appendicitis, acute, with peritonitis s/p lap appendectomy  09/29/2016 09/29/2016  . Initiation of Depo Provera 07/29/2015    Past Surgical History:  Procedure Laterality Date  . APPENDECTOMY  09/2016  . CESAREAN SECTION N/A 09/07/2017   Procedure: CESAREAN SECTION;  Surgeon: Mora Bellman, MD;  Location: [place];  Service: Obstetrics;  Laterality: N/A;  . LAPAROSCOPIC APPENDECTOMY N/A 09/29/2016   Procedure: APPENDECTOMY LAPAROSCOPIC;  Surgeon: Greer Pickerel, MD;  Location: [facility];  Service: General;  Laterality: N/A;  . NO PAST SURGERIES       OB History    Gravida  2   Para  1   Term  1   Preterm  0   AB  1   Living  1     SAB  1   TAB  0   Ectopic  0   Multiple      Live Births  1            Home Medications    Prior to Admission medications   Medication Sig Start Date End Date Taking? Authorizing Provider  acetaminophen (TYLENOL) 325 MG tablet Take 650 mg by mouth every 6 (six) hours as needed for mild pain.    [provider]  etonogestrel (NEXPLANON) 68 MG IMPL implant 1 each by Subdermal route once.    [provider]  Fremanezumab-vfrm (AJOVY) 225 MG/1.5ML SOSY Inject 1.5 mLs into the skin every 30 (thirty) days. 12/07/18   Lomax, Amy, NP  hydrOXYzine (ATARAX/VISTARIL) 25 MG tablet Take 1 tablet (25 mg total) by mouth at  bedtime and may repeat dose one time if needed. 01/18/19   Scot Jun, FNP  ibuprofen (ADVIL,MOTRIN) 200 MG tablet Take 200 mg by mouth every 6 (six) hours as needed for mild pain.    [provider]  methocarbamol (ROBAXIN) 500 MG tablet Take 1 tablet (500 mg total) by mouth 4 (four) times daily. 03/24/19   Carlisle Cater, PA-C  metoCLOPramide (REGLAN) 10 MG tablet Take 1 tablet (10 mg total) by mouth 3 (three) times daily as needed for nausea (or headache). 12/29/18   Melvenia Beam, MD  nicotine (NICODERM CQ) 7 mg/24hr patch Place 1 patch (7 mg total) onto the skin daily. Patient not taking: Reported on 01/04/2019 02/03/17   Horald Pollen, MD   ondansetron (ZOFRAN ODT) 4 MG disintegrating tablet Take 1 tablet (4 mg total) by mouth every 8 (eight) hours as needed for nausea or vomiting. 02/15/19   Wieters, Hallie C, PA-C  ondansetron (ZOFRAN) 4 MG tablet Take 1 tablet (4 mg total) by mouth every 6 (six) hours. 01/04/19   McDonald, Mia A, PA-C  oxyCODONE-acetaminophen (PERCOCET/ROXICET) 5-325 MG tablet Take 2 tablets by mouth every 4 (four) hours as needed for severe pain. 01/04/19   McDonald, Mia A, PA-C  predniSONE (DELTASONE) 20 MG tablet 3 Tabs PO Days 1-3, then 2 tabs PO Days 4-6, then 1 tab PO Day 7-9, then Half Tab PO Day 10-12 03/24/19   Carlisle Cater, PA-C  rizatriptan (MAXALT) 10 MG tablet Take 1 tablet (10 mg total) by mouth as needed for migraine. May repeat in 2 hours if needed 12/07/18   Debbora Presto, NP    Family History Family History  Problem Relation Age of Onset  . Asthma Mother   . Migraines Sister     Social History Social History   Tobacco Use  . Smoking status: Former Smoker    Packs/day: 0.25    Years: 10.00    Pack years: 2.50    Types: Cigarettes  . Smokeless tobacco: Never Used  Substance Use Topics  . Alcohol use: No    Alcohol/week: 0.0 standard drinks  . Drug use: No     Allergies   Topamax [topiramate]   Review of Systems Review of Systems  Constitutional: Negative for fever and unexpected weight change.  Eyes: Negative for visual disturbance.  Respiratory: Negative for shortness of breath.   Cardiovascular: Negative for chest pain.  Gastrointestinal: Negative for abdominal pain, diarrhea, nausea and vomiting.       Negative for fecal incontinence.   Genitourinary: Negative for dysuria and vaginal bleeding.       Negative for urinary incontinence or retention.  Musculoskeletal: Positive for back pain and neck pain.  Neurological: Positive for numbness (paresthesia). Negative for weakness.     Physical Exam Updated Vital Signs BP 130/74   Pulse 78   Temp 97.8 F (36.6 C) (Oral)    Resp 16   SpO2 99%   Physical Exam Vitals signs and nursing note reviewed.  Constitutional:      Appearance: She is well-developed.  HENT:     Head: Normocephalic and atraumatic.  Eyes:     Conjunctiva/sclera: Conjunctivae normal.  Neck:     Musculoskeletal: Normal range of motion and neck supple.  Pulmonary:     Effort: Pulmonary effort is normal.  Abdominal:     Palpations: Abdomen is soft.     Tenderness: There is no abdominal tenderness. There is no guarding.  Musculoskeletal: Normal range of motion.  Right shoulder: She exhibits tenderness. She exhibits normal range of motion and no bony tenderness.     Right elbow: Normal.    Right wrist: Normal.     Cervical back: She exhibits tenderness (R paraspinous, trapezius). She exhibits normal range of motion and no bony tenderness.     Right upper arm: She exhibits tenderness. She exhibits no bony tenderness and no swelling.     Right forearm: Normal.     Right hand: Normal sensation (normal sensation at time of exam) noted.     Comments: No step-off noted with palpation of spine.   Skin:    General: Skin is warm and dry.     Findings: No rash.  Neurological:     Mental Status: She is alert.     Sensory: No sensory deficit.     Deep Tendon Reflexes: Reflexes are normal and symmetric.     Comments: 5/5 strength in entire lower extremities bilaterally. No sensation deficit.       ED Treatments / Results  Labs (all labs ordered are listed, but only abnormal results are displayed) Labs Reviewed - No data to display  EKG None  Radiology No results found.  Procedures Procedures (including critical care time)  Medications Ordered in ED Medications  naproxen (NAPROSYN) tablet 500 mg (has no administration in time range)     Initial Impression / Assessment and Plan / ED Course  I have reviewed the triage vital signs and the nursing notes.  Pertinent labs & imaging results that were available during my care of  the patient were reviewed by me and considered in my medical decision making (see chart for details).        Patient seen and examined.    Vital signs reviewed and are as follows: BP 118/80 (BP Location: Right Arm)   Pulse 66   Temp 99.1 F (37.3 C)   Resp 16   SpO2 100%   Patient has what sounds like muscles strain and spasm versus cervical radiculopathy.  She will be started on Robaxin and prednisone.  I encouraged her to follow-up with her primary care doctor for further evaluation.  Encouraged rest.  Patient counseled on proper use of muscle relaxant medication.  They were told not to drink alcohol, drive any vehicle, or do any dangerous activities while taking this medication.  Patient verbalized understanding.   The patient verbalizes understanding and agrees with the plan.    Final Clinical Impressions(s) / ED Diagnoses   Final diagnoses:  Acute pain of right shoulder  Right hand paresthesia   Patient with right shoulder pain.  She has muscular tenderness with palpation and movement.  She also describes a pain that shoots down into her right arm causing paresthesias in her distal arm.  She does not have any weakness on history or exam.  No lower extremity symptoms to suggest central cord syndrome.  No accompanying features suggestive of stroke tonight.  Will treat symptomatically with prednisone and Robaxin.  We will have the patient follow-up with her primary care doctor.  ED Discharge Orders         Ordered    predniSONE (DELTASONE) 20 MG tablet     03/24/19 2336    methocarbamol (ROBAXIN) 500 MG tablet  4 times daily     03/24/19 2336           Carlisle Cater, PA-C 03/24/19 [place], [place], MD 03/25/19 [phone]

## 2019-06-07 ENCOUNTER — Encounter: Payer: Self-pay | Admitting: Neurology

## 2019-06-07 ENCOUNTER — Other Ambulatory Visit: Payer: Self-pay

## 2019-06-07 ENCOUNTER — Ambulatory Visit (INDEPENDENT_AMBULATORY_CARE_PROVIDER_SITE_OTHER): Payer: Self-pay | Admitting: Neurology

## 2019-06-07 DIAGNOSIS — G43001 Migraine without aura, not intractable, with status migrainosus: Secondary | ICD-10-CM

## 2019-06-07 MED ORDER — AJOVY 225 MG/1.5ML ~~LOC~~ SOSY: 1.5000 mL | PREFILLED_SYRINGE | SUBCUTANEOUS | Status: DC

## 2019-06-07 MED ORDER — CYCLOBENZAPRINE HCL 10 MG PO TABS: 10.0000 mg | ORAL_TABLET | Freq: Every day | ORAL | 6 refills | Status: DC

## 2019-06-07 MED ORDER — RIZATRIPTAN BENZOATE 10 MG PO TABS: 10.0000 mg | ORAL_TABLET | ORAL | 11 refills | Status: DC | PRN

## 2019-06-07 MED ORDER — METOCLOPRAMIDE HCL 10 MG PO TABS: 10.0000 mg | ORAL_TABLET | Freq: Three times a day (TID) | ORAL | 11 refills | Status: DC | PRN

## 2019-06-07 NOTE — Patient Instructions (Signed)
Flexeril at bedtime  Cyclobenzaprine tablets Qu es este medicamento? La CICLOBENZAPRINA es un relajante muscular. Se utiliza para tratar [employer] y la rigidez de los msculos y espasmos musculares. Este medicamento puede ser utilizado para otros usos; si tiene alguna pregunta consulte con su proveedor de atencin mdica o con su farmacutico. MARCAS COMUNES: Fexmid, Flexeril Qu le debo informar a mi profesional de la salud antes de tomar este medicamento? Necesita saber si usted presenta alguno de los siguientes problemas o situaciones:  enfermedad cardiaca, pulso cardiaco irregular o ataque cardiaco previo  enfermedad heptica  problemas tiroideos  una reaccin alrgica o inusual a la ciclobenzaprina, antidepresivos tricclicos, lactosa, a otros medicamentos, alimentos, colorantes o conservadores  si est embarazada o buscando quedar embarazada  si est amamantando a un beb Cmo debo utilizar este medicamento? Tome este medicamento por va oral con un vaso de agua. Siga las instrucciones de la etiqueta del [place]. Si este Transport planner, tmelo con alimentos o con [employer]. No tome su medicamento con una frecuencia mayor a la indicada. Hable con su pediatra para informarse acerca del uso de este medicamento en nios. Puede requerir atencin especial. Sobredosis: Pngase en contacto inmediatamente con un centro toxicolgico o una sala de urgencia si usted cree que haya tomado demasiado medicamento. ATENCIN: [employer] es solo para usted. No comparta este medicamento con nadie. Qu sucede si me olvido de una dosis? Si olvida una dosis, tmela lo antes posible. Si es casi la hora de su dosis siguiente, tome slo esa dosis. No tome dosis adicionales o dobles. Qu puede interactuar con este medicamento? No use este medicamento con ninguno de los siguientes frmacos: IMAO, tales como Carbex, Eldepryl, Marplan, Nardil y Parnate medicamentos  narcticos para la tos safinamida Este medicamento tambin puede interactuar con los siguientes frmacos: alcohol bupropin antihistamnicos para Buyer, retail, tos y resfro ciertos medicamentos para la ansiedad o para dormir ciertos medicamentos para problemas de vejiga, tales como oxibutinina y tolterodina ciertos medicamentos para la depresin, tales como amitriptilina, fluoxetina, sertralina ciertos medicamentos para el mal de Parkinson, tales como benzatropina y trihexifenidilo ciertos medicamentos para convulsiones, tales como fenobarbital, primidona ciertos medicamentos para Training and development officer, tales como diciclomina e hiosciamina ciertos medicamentos para el mareo por movimiento, como escopolamina anestsicos generales, tales como halotano, isoflurano, metoxiflurano, propofol ipratropio anestsicos locales, tales como lidocana, pramoxina, tetracana medicamentos para relajar los msculos antes de una ciruga medicamentos narcticos para Conservation officer, historic buildings fenotiazinas, tales como clorpromazina, mesoridazina, Government social research officer, tioridazina verapamilo Puede ser que esta lista no menciona todas las posibles interacciones. Informe a su profesional de [employer]	[place] de [employer] productos a base de hierbas, medicamentos de [place] o suplementos nutritivos que est tomando. Si usted fuma, consume bebidas alcohlicas o si utiliza drogas ilegales, indqueselo tambin a su profesional de [employer]	[place]. Algunas sustancias pueden interactuar con su medicamento. A qu debo estar atento al usar [employer]? Informe a su mdico o a su profesional de la salud si sus sntomas no comienzan a mejorar o si empeoran. Puede experimentar somnolencia o mareos. No conduzca, no utilice maquinaria ni haga nada que Associate Professor en estado de alerta hasta que sepa cmo le afecta este medicamento. No se siente ni se ponga de pie con rapidez, especialmente si es un paciente de edad avanzada. Esto reduce el riesgo de mareos o [employer]. El  alcohol puede interferir con el efecto de este medicamento. Evite consumir bebidas alcohlicas. Si est tomando otro medicamento que tambin causa somnolencia, es posible que  tenga ms efectos secundarios. Entrguele a su proveedor de atencin mdica una lista de todos los medicamentos que [place]. Su mdico le dir cunto Dentist. No tome ms medicamento que lo indicado. Llame al servicio de emergencias para recibir ayuda si tiene problemas para respirar o somnolencia inusual. Se le podra secar la boca. Masticar chicle sin azcar, chupar caramelos duros y tomar agua en abundancia lo ayudar a mantener la boca hmeda. Si el problema no desaparece o es severo, consulte a su mdico. Qu efectos secundarios puedo tener al [employer] este medicamento? Efectos secundarios que debe informar a su mdico o a Barrister's clerk de la salud tan pronto como sea posible: Chief of Staff, como erupcin cutnea, comezn/picazn o urticarias, hinchazn de la cara, los labios o la lengua problemas respiratorios dolor en el pecho ritmo cardiaco rpido, irregular alucinaciones convulsiones cansancio o debilidad inusual Efectos secundarios que generalmente no requieren atencin mdica (debe informarlos a su mdico o a su profesional de la salud si persisten o si son molestos): dolor de cabeza nuseas, vmito Puede ser que esta lista no menciona todos los posibles efectos secundarios. Comunquese a su mdico por asesoramiento mdico [employer]. Usted puede informar los efectos secundarios a la FDA por telfono al 1-800-FDA-1088. Dnde debo guardar mi medicina? Mantngala fuera del alcance de los nios. [place]dela a [employer], entre 15 y 26 grados C (18 y 25 grados F). Mantenga el envase bien cerrado. Deseche todo el medicamento que no haya utilizado, despus de la fecha de vencimiento. ATENCIN: Este folleto es un resumen. Puede ser que no cubra toda la posible informacin. Si usted tiene  preguntas acerca de esta medicina, consulte con su mdico, su farmacutico o su profesional de Technical sales engineer.  2020 Elsevier/Gold Standard (2018-11-17 00:00:00)

## 2019-06-07 NOTE — Progress Notes (Signed)
[facility]    Provider:  Dr Jaynee Eagles Referring Provider: Emergency Room Primary Care Provider:  Scot Jun, FNP  CC:  Chronic daily headaches  Interval history 06/07/2019:  The headaches are better. Here with interpreter. Not as strong a little better. She takes Tylenol for back pain. She stopped the Fioricet. She has 4-5 headache days a month. Will help with [facility] [employer] assistance. Will give samples of Ajovy for 6 months until then and try to get it approved through [place] assistance as well. She has tension in the back of the head, tightness.   HPI:  Meghan Reeves is a 38 y.o. female here as requested by Emergency Room for headaches. She reports that she has headaches everyday. Headaches started several months ago. Milder headaches are described as a pressure sensation across the top of her head. Pressure is throbbing. Rated 5-6/10. She is nauseated at times with mild headaches. She has more severe headaches about 1-2 times a week. She describes these headaches as more unilateral (side varies). She feels a pounding sensation. Rated 7-8/10. These headaches are associated with sound and light sensitivity. She has a vision disturbance described as a white veil that she can see on the same side she is having headache. She is nauseated with headaches but does not vomit. She denies dizziness or syncope with headaches. She feels that stress is a trigger. She has not noticed any alleviating factors with exception of rest. She has noticed that headaches last all day if not treated. She is taking Fioricet twice daily most days (occasionally once daily.) She was given prescription for 90 tablets of Fioricet on 12/11 and has about 7-8 tablets left. She was given Topamax at one UC visit that caused numbness of her lip and she saw flashy lights. She took this for about a week then stopped. Here with interpreter. No other focal neurologic deficits, associated  symptoms, inciting events or modifiable factors.  She does have trouble going to sleep. She may fall asleep around 2-3am and gets up around 9am. She does not snore. She rarely wakes up with a headache. She denies dry mouth in the mornings.   She is taking Lexapro daily for anxiety. She feels that this medication is helping some but she feels sleepy during the day.   She had a normal CT of head in 09/2018. She has never had an MRI. She does not drink or smoke. She has a sister with migraines that is treated with Advil.   Reviewed notes, labs and imaging from outside physicians, which showed: Patient was referred from the emergency room.  She was seen at the emergency room in November 2019.  She is also seen Molli Barrows as well in the office earlier this month.  She reported having lots of headaches for the last couple months.  She had 3 ER visits into urgent care visits for the same.  She was taking Advil migraine on a daily basis.  She was tried on Topamax for prevention 100 mg at bedtime and she had side effects including anxiety.  Reviewed office notes from Dr. Kenton Kingfisher.  Patient was seen in the emergency room multiple times.  She is been taking a combination of pseudoephedrine and antihistamine and feels headache improved.  She reports headaches occur almost daily.  Localized to the left side of the head occasionally is like a tension sensation across the bilateral head.  Try Topamax did not feel the medication work and discontinued it.  Endorsed  nausea and light sensitivity when headaches occur.  She was given Fioricet 90 capsules.  Review of Systems: Patient complains of symptoms per HPI as well as the following symptoms headaches, insomnia. Pertinent negatives and positives per HPI. All others negative.   Social History   Socioeconomic History   Marital status: Significant Other    Spouse name: Not on file   Number of children: 1   Years of education: Not on file   Highest  education level: 6th grade  Occupational History   Not on file  Social Needs   Financial resource strain: Not on file   Food insecurity    Worry: Not on file    Inability: Not on file   Transportation needs    Medical: Not on file    Non-medical: Not on file  Tobacco Use   Smoking status: Former Smoker    Packs/day: 0.25    Years: 10.00    Pack years: 2.50    Types: Cigarettes   Smokeless tobacco: Never Used  Substance and Sexual Activity   Alcohol use: No    Alcohol/week: 0.0 standard drinks   Drug use: No   Sexual activity: Yes    Birth control/protection: None  Lifestyle   Physical activity    Days per week: Not on file    Minutes per session: Not on file   Stress: Not on file  Relationships   Social connections    Talks on phone: Not on file    Gets together: Not on file    Attends religious service: Not on file    Active member of club or organization: Not on file    Attends meetings of clubs or organizations: Not on file    Relationship status: Not on file   Intimate partner violence    Fear of current or ex partner: Not on file    Emotionally abused: Not on file    Physically abused: Not on file    Forced sexual activity: Not on file  Other Topics Concern   Not on file  Social History Narrative   ** Merged History Encounter **       Lives at home with boyfriend  Right handed Caffeine: 30 oz daily of coca cola    Family History  Problem Relation Age of Onset   Asthma Mother    Migraines Sister     Past Medical History:  Diagnosis Date   Asthma, mild    Medical history non-contributory     Patient Active Problem List   Diagnosis Date Noted   Migraine without aura and with status migrainosus, not intractable 12/08/2018   Medication overuse headache 12/08/2018   Mixed headache 10/21/2018   Post-dates pregnancy 09/06/2017   Tobacco use disorder, continuous 02/03/2017   Encounter for smoking cessation counseling  02/03/2017   [redacted] weeks gestation of pregnancy 02/03/2017   Less than [redacted] weeks gestation of pregnancy 12/23/2016   Spanish speaking patient 10/01/2016   Appendicitis, acute, with peritonitis s/p lap appendectomy 09/29/2016 09/29/2016   Initiation of Depo Provera 07/29/2015    Past Surgical History:  Procedure Laterality Date   APPENDECTOMY  09/2016   CESAREAN SECTION N/A 09/07/2017   Procedure: CESAREAN SECTION;  Surgeon: Mora Bellman, MD;  Location: [facility];  Service: Obstetrics;  Laterality: N/A;   LAPAROSCOPIC APPENDECTOMY N/A 09/29/2016   Procedure: APPENDECTOMY LAPAROSCOPIC;  Surgeon: Greer Pickerel, MD;  Location: [facility];  Service: General;  Laterality: N/A;   NO PAST SURGERIES  Current Outpatient Medications  Medication Sig Dispense Refill   acetaminophen (TYLENOL) 325 MG tablet Take 650 mg by mouth every 6 (six) hours as needed for mild pain.     cyclobenzaprine (FLEXERIL) 10 MG tablet Take 1 tablet (10 mg total) by mouth at bedtime. 30 tablet 6   etonogestrel (NEXPLANON) 68 MG IMPL implant 1 each by Subdermal route once.     Fremanezumab-vfrm (AJOVY) 225 MG/1.5ML SOSY Inject 1.5 mLs into the skin every 30 (thirty) days. 1 mL pen   hydrOXYzine (ATARAX/VISTARIL) 25 MG tablet Take 1 tablet (25 mg total) by mouth at bedtime and may repeat dose one time if needed. (Patient not taking: Reported on 06/07/2019) 30 tablet 0   ibuprofen (ADVIL,MOTRIN) 200 MG tablet Take 200 mg by mouth every 6 (six) hours as needed for mild pain.     methocarbamol (ROBAXIN) 500 MG tablet Take 1 tablet (500 mg total) by mouth 4 (four) times daily. (Patient not taking: Reported on 06/07/2019) 20 tablet 0   metoCLOPramide (REGLAN) 10 MG tablet Take 1 tablet (10 mg total) by mouth 3 (three) times daily as needed for nausea (or headache). 15 tablet 11   nicotine (NICODERM CQ) 7 mg/24hr patch Place 1 patch (7 mg total) onto the skin daily. (Patient not taking: Reported on 01/04/2019) 28  patch 0   ondansetron (ZOFRAN ODT) 4 MG disintegrating tablet Take 1 tablet (4 mg total) by mouth every 8 (eight) hours as needed for nausea or vomiting. (Patient not taking: Reported on 06/07/2019) 20 tablet 0   ondansetron (ZOFRAN) 4 MG tablet Take 1 tablet (4 mg total) by mouth every 6 (six) hours. (Patient not taking: Reported on 06/07/2019) 12 tablet 0   oxyCODONE-acetaminophen (PERCOCET/ROXICET) 5-325 MG tablet Take 2 tablets by mouth every 4 (four) hours as needed for severe pain. (Patient not taking: Reported on 06/07/2019) 5 tablet 0   predniSONE (DELTASONE) 20 MG tablet 3 Tabs PO Days 1-3, then 2 tabs PO Days 4-6, then 1 tab PO Day 7-9, then Half Tab PO Day 10-12 (Patient not taking: Reported on 06/07/2019) 20 tablet 0   rizatriptan (MAXALT) 10 MG tablet Take 1 tablet (10 mg total) by mouth as needed for migraine. May repeat in 2 hours if needed 10 tablet 11   No current facility-administered medications for this visit.     Allergies as of 06/07/2019 - Review Complete 06/07/2019  Allergen Reaction Noted   Topamax [topiramate] Other (See Comments) 10/18/2018    Vitals: BP 107/66 (BP Location: Right Arm, Patient Position: Sitting, Cuff Size: Normal)    Pulse 85    Temp 98 F (36.7 C) (Temporal)    Ht 5\' 1"  (1.549 m)    Wt 171 lb (77.6 kg)    BMI 32.31 kg/m  Last Weight:  Wt Readings from Last 1 Encounters:  06/07/19 171 lb (77.6 kg)   Last Height:   Ht Readings from Last 1 Encounters:  06/07/19 5\' 1"  (1.549 m)     Physical exam: Exam: Gen: NAD, conversant, well nourised, obese, well groomed                     CV: RRR, no MRG. No Carotid Bruits. No peripheral edema, warm, nontender Eyes: Conjunctivae clear without exudates or hemorrhage  Neuro: Detailed Neurologic Exam  Speech:    Speech is normal; fluent and spontaneous with normal comprehension.  Cognition:    The patient is oriented to person, place, and time;  recent and remote memory intact;     language  fluent;     normal attention, concentration,     fund of knowledge Cranial Nerves:    The pupils are equal, round, and reactive to light. The fundi are normal and spontaneous venous pulsations are present. Visual fields are full to finger confrontation. Extraocular movements are intact. Trigeminal sensation is intact and the muscles of mastication are normal. The face is symmetric. The palate elevates in the midline. Hearing intact. Voice is normal. Shoulder shrug is normal. The tongue has normal motion without fasciculations.   Coordination:    Normal finger to nose and heel to shin. Normal rapid alternating movements.   Gait:    Heel-toe and tandem gait are normal.   Motor Observation:    No asymmetry, no atrophy, and no involuntary movements noted. Tone:    Normal muscle tone.    Posture:    Posture is normal. normal erect    Strength:    Strength is V/V in the upper and lower limbs.      Sensation: intact to LT     Reflex Exam:  DTR's:    Deep tendon reflexes in the upper and lower extremities are normal bilaterally.   Toes:    The toes are downgoing bilaterally.   Clonus:    Clonus is absent.    Assessment/Plan:  Migraines, doing well on Ajovy.   Migraines: Discussed migraine management including acute management and preventative management. We'll start patient on both. Discussed Ajovy side effects especially teratogenicity do not get pregnant and use birth control. Gave her 6 months of samples, there is excellent literature and a large clinical trial showing CGRP medications are helpful in treating chronic migraines with medication overuse.  Remember to drink plenty of fluid, eat healthy meals and do not skip any meals. Try to eat protein with a every meal and eat a healthy snack such as fruit or nuts in between meals. Try to keep a regular sleep-wake schedule and try to exercise daily, particularly in the form of walking, 20-30 minutes a day, if you can.   Acutely:  Maxalt, Zofran and Reglan Preventative: Ajovy We helped her today with [facility] [employer] assistance. Gave samples of Ajovy for 6 months  and will try to get it approved through [place] assistance program.  Flexeril at bedtime:  She has tension in the back of the head, tightness.  . Meds ordered this encounter  Medications   rizatriptan (MAXALT) 10 MG tablet    Sig: Take 1 tablet (10 mg total) by mouth as needed for migraine. May repeat in 2 hours if needed    Dispense:  10 tablet    Refill:  11    Please do not refill early   metoCLOPramide (REGLAN) 10 MG tablet    Sig: Take 1 tablet (10 mg total) by mouth 3 (three) times daily as needed for nausea (or headache).    Dispense:  15 tablet    Refill:  11    NO EARLY REFILLS.   Fremanezumab-vfrm (AJOVY) 225 MG/1.5ML SOSY    Sig: Inject 1.5 mLs into the skin every 30 (thirty) days.    Dispense:  1 mL    Refill:  pen    Order Specific Question:   Lot Number?    Answer:   [id number]    Order Specific Question:   Expiration Date?    Answer:   05/09/2020    Order Specific Question:   NDC  Answer:   56387-564-33 [[id number]]    Order Specific Question:   Quantity    Answer:   6   cyclobenzaprine (FLEXERIL) 10 MG tablet    Sig: Take 1 tablet (10 mg total) by mouth at bedtime.    Dispense:  30 tablet    Refill:  6     Discussed; To prevent or relieve headaches, try the following:  Cool Compress. Lie down and place a cool compress on your head.   Avoid headache triggers. If certain foods or odors seem to have triggered your migraines in the past, avoid them. A headache diary might help you identify triggers.   Include physical activity in your daily routine. Try a daily walk or other moderate aerobic exercise.   Manage stress. Find healthy ways to cope with the stressors, such as delegating tasks on your to-do list.   Practice relaxation techniques. Try deep breathing, yoga, massage and visualization.   Eat regularly. Eating  regularly scheduled meals and maintaining a healthy diet might help prevent headaches. Also, drink plenty of fluids.   Follow a regular sleep schedule. Sleep deprivation might contribute to headaches  Consider biofeedback. With this mind-body technique, you learn to control certain bodily functions -- such as muscle tension, heart rate and blood pressure -- to prevent headaches or reduce headache pain.    Proceed to emergency room if you experience new or worsening symptoms or symptoms do not resolve, if you have new neurologic symptoms or if headache is severe, or for any concerning symptom.    A total of 15 minutes was spent face-to-face with this patient. Over half this time was spent on counseling patient on the  1. Migraine without aura and with status migrainosus, not intractable    diagnosis and different diagnostic and therapeutic options, counseling and coordination of care, risks ans benefits of management, compliance, or risk factor reduction and education.     Cc:  Scot Jun, FNP  Sarina Ill, MD  [facility] [address] [place], [place] [address]  Phone [phone] Fax [phone]

## 2019-06-21 ENCOUNTER — Telehealth: Payer: Self-pay | Admitting: Neurology

## 2019-06-21 NOTE — Telephone Encounter (Signed)
Called Language line and spoke to [name] and we called patient to tell her that I was going to send her a application for AJOVY that she would  have to submit and sent in her self. [name] also relayed on message that she could call if she had questions. Form has been mailed.

## 2019-06-23 ENCOUNTER — Other Ambulatory Visit: Payer: Self-pay

## 2019-06-23 DIAGNOSIS — Z20822 Contact with and (suspected) exposure to covid-19: Secondary | ICD-10-CM

## 2019-06-25 LAB — NOVEL CORONAVIRUS, NAA: SARS-CoV-2, NAA: NOT DETECTED

## 2019-06-26 ENCOUNTER — Telehealth: Payer: Self-pay | Admitting: Family Medicine

## 2019-06-26 NOTE — Telephone Encounter (Signed)
Patient is calling to receive negative COVID by way of [employer]. Patient by way of [employer] expressed understanding.  Thank you

## 2019-07-04 ENCOUNTER — Telehealth: Payer: Self-pay | Admitting: *Deleted

## 2019-07-04 NOTE — Telephone Encounter (Signed)
Patient stopped by the office.  States she is having headaches daily.  Please call.  She requests to be called with the  Interpreter line.

## 2019-07-04 NOTE — Telephone Encounter (Signed)
I went to the front desk and relayed to patient that she had to sign Ajovy her self. Patient is going to get one of her friends to help her . Patient has apt with Amy on 07/10/2019.

## 2019-07-04 NOTE — Telephone Encounter (Signed)
Noted thanks °

## 2019-07-05 ENCOUNTER — Ambulatory Visit: Payer: Self-pay | Admitting: Family Medicine

## 2019-07-10 ENCOUNTER — Ambulatory Visit: Payer: Self-pay | Admitting: Family Medicine

## 2019-07-10 ENCOUNTER — Other Ambulatory Visit: Payer: Self-pay

## 2019-07-10 ENCOUNTER — Encounter: Payer: Self-pay | Admitting: Family Medicine

## 2019-07-10 VITALS — BP 112/73 | HR 92 | Temp 98.2°F | Ht 61.0 in | Wt 172.2 lb

## 2019-07-10 DIAGNOSIS — G43001 Migraine without aura, not intractable, with status migrainosus: Secondary | ICD-10-CM

## 2019-07-10 NOTE — Patient Instructions (Signed)
Try Xyzal or Zyrtec over the counter for concerns of allergy symptoms (ask pharmacist if you need help)  Please make sure to drink at least 4 bottles of water  Continue Ajovy monthly and Flexeril as needed  Follow up in 6 months, sooner if needed   Rinitis alrgica en adultos Allergic Rhinitis, Adult La rinitis alrgica es una reaccin a los alrgenos que se encuentran en el aire. Los alrgenos son las partculas minsculas que estn en el aire y que hacen que el cuerpo tenga una reaccin IT consultant. Esta afeccin no se puede transmitir de [place] persona a otra (no es contagiosa). La rinitis alrgica no se puede curar, pero puede controlarse. [employer] tipos de rinitis alrgica:  Psychologist, counselling. Este tipo tambin se denomina fiebre del heno. Sucede nicamente durante ciertas pocas del ao.  Perenne. Este tipo puede ocurrir en cualquier momento del ao. Cules son las causas? Esta afeccin puede ser causada por lo siguiente:  El polen que proviene de los rboles, el pasto y las [place].  caros del polvo en Engineer, mining.  Caspa de las [employer].  Moho. Cules son los signos o los sntomas? Los sntomas de esta afeccin incluyen:  Estornudos.  Nariz tapada o que gotea (congestin nasal).  Abundante mucosidad en la parte posterior de la garganta (goteo posnasal).  Escozor en la Lawyer.  Lagrimeo.  Dificultad para dormir.  Estar somnoliento Agricultural consultant. Cmo se trata? No hay cura para esta afeccin. Debe evitar las cosas que desencadenan sus sntomas (alrgenos). El tratamiento puede ayudar a [employer] sntomas. Puede incluir:  Medicamentos que [employer] sntomas de la [place] antihistamnicos. Estos pueden administrarse en forma de inyeccin, aerosol nasal o comprimidos.  Vacunas que se administran hasta que el cuerpo se vuelve menos sensible al alrgeno (desensibilizacin).  Medicamentos ms potentes, si todos los dems tratamientos no han sido eficaces.  Siga estas indicaciones en su casa: Evite los alrgenos   Conozca a qu es Air cabin crew. Los alrgenos comunes incluyen el humo, polvo y [place].  Evtelos si puede. Hay algunas medidas que puede tomar para evitar los alrgenos: ? [employer] alfombras por pisos de [place], baldosas o vinilo. Las alfombras pueden retener la caspa de los animales y [place]. ? Limpie cualquier moho que encuentre en la casa. ? No fume. No permita que fumen en su casa. ? Cambie el filtro de la calefaccin y del aire acondicionado al menos una vez al mes. ? Durante la temporada de alergias:  Mantenga las ventanas cerradas todo el tiempo posible. Si es posible, use aire acondicionado cuando hay mucho polen en el aire.  Use un filtro especial para alergias con la caldera y el aire acondicionado.  Planee actividades al aire libre cuando las concentraciones de polen estn en su nivel ms bajo. Normalmente, esto es por la maana temprano o durante las horas de la noche.  Si sale al aire libre cuando la concentracin de polen es [place], use una mscara especial para personas con Set designer.  Cuando vuelva al interior, dese una ducha y [place] de ropa antes de sentarse en los muebles o en la cama. Indicaciones generales  No use ventiladores en su hogar.  No cuelgue ropa en el exterior para que se seque.  Use gafas para el sol para mantener el polen alejado de los ojos.  Lvese las manos enseguida despus de tocar a las [employer].  Tome los medicamentos de venta libre y los recetados solamente como se lo haya indicado el  mdico.  Concurra a todas las visitas de control como se lo haya indicado el mdico. Esto es importante. Comunquese con un mdico si:  Tiene fiebre.  Tiene tos que no desaparece (es persistente).  Comienza a emitir un sonido agudo al respirar (sibilancias).  Los sntomas no mejoran con Dispensing optician.  Le sale lquido espeso por la Lawyer.  Comienza a tener hemorragia nasal.  Solicite ayuda inmediatamente si:  Tiene la boca o los labios hinchados.  Tiene dificultad para respirar.  Se siente mareado o como si se fuera a desmayar.  Tiene transpiracin fra. Resumen  La rinitis alrgica es una reaccin a los alrgenos que se encuentran en el aire.  Esta afeccin puede ser causada por alrgenos. Estos [employer], los caros del polvo, la caspa de las mascotas y Building services engineer.  Los sntomas son goteo y picazn nasal, estornudos o Industrial/product designer. Tambin es posible que tenga dificultad para dormir o que sienta sueo Agricultural consultant.  El tratamiento incluye tomar medicamentos y Management consultant. Tambin es posible que deba recibir vacunas o tomar medicamentos ms potentes.  Solicite ayuda si tiene fiebre o tos que no se detiene. Solicite ayuda de inmediato si le falta el aire. Esta informacin no tiene Marine scientist el consejo del mdico. Asegrese de hacerle al mdico cualquier pregunta que tenga. Document Released: 03/30/2011 Document Revised: 06/29/2018 Document Reviewed: 06/29/2018 Elsevier Patient Education  [place] rebote por consumo excesivo de analgsicos (Analgesic Rebound Headache) Una cefalea de rebote por consumo excesivo de analgsicos, a veces llamada cefalea por abuso de medicamentos, es un dolor de cabeza que se manifiesta despus de que desaparece el efecto de los analgsicos tomados para tratar el dolor de cabeza original (primario). Cualquier tipo de dolor de cabeza primario puede regresar como una cefalea de rebote si una persona toma analgsicos regularmente ms de tres veces por semana para tratarlo. Los tipos de Social research officer, government de cabeza primario que se suelen TEFL teacher a las cefaleas de rebote incluyen:  Migraas.  Dolores de cabeza que surgen por una tensin muscular en la zona de la cabeza y del cuello (cefaleas tensionales).  Dolores de cabeza que aparecen y vuelven a Arts administrator (recurrentes) de un lado de la cabeza y  alrededor del ojo (cefaleas en racimos). Si las cefaleas de rebote continan, estas se convierten en dolores de cabeza diarios crnicos. CAUSAS Esta afeccin puede ser causada por el uso frecuente de:  Medicamentos de venta libre, como aspirina, ibuprofeno y paracetamol.  Antisinusticos y otros medicamentos que contienen cafena.  Analgsicos opiceos, como codena y [place]. SNTOMAS Los sntomas de la cefalea de rebote son los mismos que los del dolor de [place] original. Algunos de los sntomas de tipos especficos de cefaleas incluyen lo siguiente: Consulting civil engineer  Dolor pulstil e intermitente en uno o ambos lados de la cabeza.  Dolor intenso que dificulta las actividades cotidianas.  Dolor que empeora con la actividad fsica.  Nuseas, vmitos o ambos.  Dolor ante la exposicin a luces brillantes, ruidos fuertes o aromas intensos.  Sensibilidad general a las luces brillantes, a los ruidos fuertes o a los aromas intensos.  Cambios en la visin.  Adormecimiento de uno o [employer]. Cefalea tensional.  Presin alrededor de la cabeza.  Dolor "sordo" en la cabeza.  Dolor que siente sobre la frente y los lados de la cabeza.  Sensibilidad en los msculos de la cabeza, del cuello y de los hombros. Lilly en racimos  Dolor intenso que comienza  en un ojo, alrededor de un ojo o en la sien.  Enrojecimiento y lagrimeo del ojo del mismo lado del dolor.  Prpados cados o hinchados.  Dolor en un lado de la cabeza.  Nuseas.  Secrecin nasal.  Piel del rostro sudorosa y plida.  Agitacin. DIAGNSTICO Esta afeccin se diagnostica mediante:  Una revisin de sus antecedentes mdicos. Esto incluye la naturaleza de sus dolores de cabeza primarios.  Una revisin de los tipos de analgsicos que ha estado tomando para tratar las cefaleas y la frecuencia con que los toma. TRATAMIENTO Esta afeccin se puede tratar de la siguiente manera:  Interrumpir el uso  frecuente de los analgsicos. Al principio, esto puede [employer] cefaleas, pero, con el tiempo, el dolor debe volverse ms controlable, menos frecuente y [employer] Financial.  Consultar a un especialista en cefaleas. Tal vez este profesional pueda ayudarlo a Chief Technology Officer las cefaleas y ayudar a Programmer, multimedia que estas no tengan otra causa.  Emplear mtodos alternativos de alivio del estrs, como la acupuntura, la psicoterapia, la biorregulacin y los masajes tambin pueden ayudar. Hable con el mdico respecto de qu mtodo podra ser adecuado para su caso. [place] los medicamentos de venta libre y los recetados solamente como se lo haya indicado el mdico.  Interrumpa el uso repetido de los [place], como se lo haya indicado el mdico. Tal interrupcin puede ser difcil. Siga atentamente las indicaciones del mdico.  Evite los factores desencadenantes que se sabe le causan las cefaleas primarias.  Concurra a todas las visitas de control como se lo haya indicado el mdico. Esto es importante. SOLICITE ATENCIN MDICA SI:  Sigue teniendo cefaleas despus de Optometrist los tratamientos que el mdico recomend. SOLICITE ATENCIN MDICA DE INMEDIATO SI:  Tiene nuevas cefaleas.  Tiene cefaleas que son diferentes de las que tuvo en el pasado.  Tiene adormecimiento u hormigueos en los brazos o las piernas.  Tiene cambios en la visin o en el habla. Esta informacin no tiene Marine scientist el consejo del mdico. Asegrese de hacerle al mdico cualquier pregunta que tenga. Document Released: 01/22/2009 Document Revised: 09/15/2016 Document Reviewed: 03/30/2016 Elsevier Patient Education  2020 [employer].

## 2019-07-10 NOTE — Progress Notes (Signed)
Made any corrections needed, and agree with history, physical, neuro exam,assessment and plan as stated.     Antonia Ahern, MD [facility]  

## 2019-07-10 NOTE — Progress Notes (Signed)
PATIENT: Meghan Reeves DOB: 1981/02/24  REASON FOR VISIT: follow up HISTORY FROM: patient  Chief Complaint  Patient presents with   Follow-up     Room 2, with interpreter. headache f/u. "on and off. medication change worsened"     HISTORY OF PRESENT ILLNESS: Today 07/10/19 Meghan Reeves is a 38 y.o. female here today for follow up for migraines.  She is here with an interpreter who aids in obtaining history.  She has continued Ajovy injections monthly.  She does feel that these injections have helped some with reducing the intensity of her headaches.  She was given cyclobenzaprine at last visit.  She does feel that this helps with muscle tension.  She continues to have daily headaches. She reports pressure across her forehead. She has a throbbing sensation in the back of her head.  She does note runny nose, sore throat and body aches when she has worsening headaches.  She has not been treated for seasonal allergies in the past.  She drinks 2 bottles of water daily.  She does take ibuprofen at least 3-4 times weekly.  She has taken rizatriptan once but is not sure it helped.  HISTORY: (copied from Dr Cathren Laine note on 06/07/2019)  Interval history 06/07/2019:  The headaches are better. Here with interpreter. Not as strong a little better. She takes Tylenol for back pain. She stopped the Fioricet. She has 4-5 headache days a month. Will help with [facility] assistance. Will give samples of Ajovy for 6 months until then and try to get it approved through [place] assistance as well. She has tension in the back of the head, tightness.   HPI:  Meghan Reeves is a 38 y.o. female here as requested by Emergency Room for headaches. She reports that she has headaches everyday. Headaches started several months ago. Milder headaches are described as a pressure sensation across the top of her head. Pressure is throbbing. Rated 5-6/10. She is nauseated at  times with mild headaches. She has more severe headaches about 1-2 times a week. She describes these headaches as more unilateral (side varies). She feels a pounding sensation. Rated 7-8/10. These headaches are associated with sound and light sensitivity. She has a vision disturbance described as a white veil that she can see on the same side she is having headache. She is nauseated with headaches but does not vomit. She denies dizziness or syncope with headaches. She feels that stress is a trigger. She has not noticed any alleviating factors with exception of rest. She has noticed that headaches last all day if not treated. She is taking Fioricet twice daily most days (occasionally once daily.) She was given prescription for 90 tablets of Fioricet on 12/11 and has about 7-8 tablets left. She was given Topamax at one UC visit that caused numbness of her lip and she saw flashy lights. She took this for about a week then stopped. Here with interpreter. No other focal neurologic deficits, associated symptoms, inciting events or modifiable factors.  She does have trouble going to sleep. She may fall asleep around 2-3am and gets up around 9am. She does not snore. She rarely wakes up with a headache. She denies dry mouth in the mornings.   She is taking Lexapro daily for anxiety. She feels that this medication is helping some but she feels sleepy during the day.   She had a normal CT of head in 09/2018. She has never had an MRI. She does not  drink or smoke. She has a sister with migraines that is treated with Advil.   Reviewed notes, labs and imaging from outside physicians, which showed: Patient was referred from the emergency room.  She was seen at the emergency room in November 2019.  She is also seen Molli Barrows as well in the office earlier this month.  She reported having lots of headaches for the last couple months.  She had 3 ER visits into urgent care visits for the same.  She was taking Advil  migraine on a daily basis.  She was tried on Topamax for prevention 100 mg at bedtime and she had side effects including anxiety.  Reviewed office notes from Dr. Kenton Kingfisher.  Patient was seen in the emergency room multiple times.  She is been taking a combination of pseudoephedrine and antihistamine and feels headache improved.  She reports headaches occur almost daily.  Localized to the left side of the head occasionally is like a tension sensation across the bilateral head.  Try Topamax did not feel the medication work and discontinued it.  Endorsed nausea and light sensitivity when headaches occur.  She was given Fioricet 90 capsules.   REVIEW OF SYSTEMS: Out of a complete 14 system review of symptoms, the patient complains only of the following symptoms, headaches, weakness and all other reviewed systems are negative.   ALLERGIES: Allergies  Allergen Reactions   Topamax [Topiramate] Other (See Comments)    Blurry vision, numbness of lips    HOME MEDICATIONS: Outpatient Medications Prior to Visit  Medication Sig Dispense Refill   Fremanezumab-vfrm (AJOVY) 225 MG/1.5ML SOSY Inject 1.5 mLs into the skin every 30 (thirty) days. 1 mL pen   ibuprofen (ADVIL,MOTRIN) 200 MG tablet Take 200 mg by mouth every 6 (six) hours as needed for mild pain.     metoCLOPramide (REGLAN) 10 MG tablet Take 1 tablet (10 mg total) by mouth 3 (three) times daily as needed for nausea (or headache). 15 tablet 11   rizatriptan (MAXALT) 10 MG tablet Take 1 tablet (10 mg total) by mouth as needed for migraine. May repeat in 2 hours if needed 10 tablet 11   cyclobenzaprine (FLEXERIL) 10 MG tablet Take 1 tablet (10 mg total) by mouth at bedtime. 30 tablet 6   acetaminophen (TYLENOL) 325 MG tablet Take 650 mg by mouth every 6 (six) hours as needed for mild pain.     etonogestrel (NEXPLANON) 68 MG IMPL implant 1 each by Subdermal route once.     hydrOXYzine (ATARAX/VISTARIL) 25 MG tablet Take 1 tablet (25 mg total)  by mouth at bedtime and may repeat dose one time if needed. (Patient not taking: Reported on 06/07/2019) 30 tablet 0   methocarbamol (ROBAXIN) 500 MG tablet Take 1 tablet (500 mg total) by mouth 4 (four) times daily. (Patient not taking: Reported on 06/07/2019) 20 tablet 0   nicotine (NICODERM CQ) 7 mg/24hr patch Place 1 patch (7 mg total) onto the skin daily. (Patient not taking: Reported on 01/04/2019) 28 patch 0   ondansetron (ZOFRAN ODT) 4 MG disintegrating tablet Take 1 tablet (4 mg total) by mouth every 8 (eight) hours as needed for nausea or vomiting. (Patient not taking: Reported on 06/07/2019) 20 tablet 0   ondansetron (ZOFRAN) 4 MG tablet Take 1 tablet (4 mg total) by mouth every 6 (six) hours. (Patient not taking: Reported on 06/07/2019) 12 tablet 0   oxyCODONE-acetaminophen (PERCOCET/ROXICET) 5-325 MG tablet Take 2 tablets by mouth every 4 (four) hours as needed for  severe pain. (Patient not taking: Reported on 06/07/2019) 5 tablet 0   predniSONE (DELTASONE) 20 MG tablet 3 Tabs PO Days 1-3, then 2 tabs PO Days 4-6, then 1 tab PO Day 7-9, then Half Tab PO Day 10-12 (Patient not taking: Reported on 06/07/2019) 20 tablet 0   No facility-administered medications prior to visit.     PAST MEDICAL HISTORY: Past Medical History:  Diagnosis Date   Asthma, mild    Medical history non-contributory     PAST SURGICAL HISTORY: Past Surgical History:  Procedure Laterality Date   APPENDECTOMY  09/2016   CESAREAN SECTION N/A 09/07/2017   Procedure: CESAREAN SECTION;  Surgeon: Mora Bellman, MD;  Location: [facility];  Service: Obstetrics;  Laterality: N/A;   LAPAROSCOPIC APPENDECTOMY N/A 09/29/2016   Procedure: APPENDECTOMY LAPAROSCOPIC;  Surgeon: Greer Pickerel, MD;  Location: [facility];  Service: General;  Laterality: N/A;   NO PAST SURGERIES      FAMILY HISTORY: Family History  Problem Relation Age of Onset   Asthma Mother    Migraines Sister     SOCIAL HISTORY: Social  History   Socioeconomic History   Marital status: Significant Other    Spouse name: Not on file   Number of children: 1   Years of education: Not on file   Highest education level: 6th grade  Occupational History   Not on file  Social Needs   Financial resource strain: Not on file   Food insecurity    Worry: Not on file    Inability: Not on file   Transportation needs    Medical: Not on file    Non-medical: Not on file  Tobacco Use   Smoking status: Former Smoker    Packs/day: 0.25    Years: 10.00    Pack years: 2.50    Types: Cigarettes   Smokeless tobacco: Never Used  Substance and Sexual Activity   Alcohol use: No    Alcohol/week: 0.0 standard drinks   Drug use: No   Sexual activity: Yes    Birth control/protection: None  Lifestyle   Physical activity    Days per week: Not on file    Minutes per session: Not on file   Stress: Not on file  Relationships   Social connections    Talks on phone: Not on file    Gets together: Not on file    Attends religious service: Not on file    Active member of club or organization: Not on file    Attends meetings of clubs or organizations: Not on file    Relationship status: Not on file   Intimate partner violence    Fear of current or ex partner: Not on file    Emotionally abused: Not on file    Physically abused: Not on file    Forced sexual activity: Not on file  Other Topics Concern   Not on file  Social History Narrative   ** Merged History Encounter **       Lives at home with boyfriend  Right handed Caffeine: 30 oz daily of coca cola      PHYSICAL EXAM  Vitals:   07/10/19 1329  BP: 112/73  Pulse: 92  Temp: 98.2 F (36.8 C)  Weight: 172 lb 3.2 oz (78.1 kg)  Height: 5\' 1"  (1.549 m)   Body mass index is 32.54 kg/m.  Generalized: Well developed, in no acute distress  Neurological examination  Mentation: Alert oriented to time, place, history taking. Follows all  commands speech and  language fluent Cranial nerve II-XII: Pupils were equal round reactive to light. Extraocular movements were full, visual field were full on confrontational test. Motor: The motor testing reveals 5 over 5 strength of all 4 extremities. Good symmetric motor tone is noted throughout.  Coordination: Cerebellar testing reveals good finger-nose-finger and heel-to-shin bilaterally.  Gait and station: Gait is normal.    DIAGNOSTIC DATA (LABS, IMAGING, TESTING) - I reviewed patient records, labs, notes, testing and imaging myself where available.  No flowsheet data found.   Lab Results  Component Value Date   WBC 14.3 (H) 01/04/2019   HGB 13.9 01/04/2019   HCT 41.0 01/04/2019   MCV 91.5 01/04/2019   PLT 285 01/04/2019      Component Value Date/Time   NA 135 01/04/2019 0139   K 3.8 01/04/2019 0139   CL 102 01/04/2019 0139   CO2 22 01/04/2019 0139   GLUCOSE 130 (H) 01/04/2019 0139   BUN 15 01/04/2019 0139   CREATININE 0.95 01/04/2019 0139   CALCIUM 9.3 01/04/2019 0139   PROT 6.9 01/04/2019 0139   ALBUMIN 3.7 01/04/2019 0139   AST 46 (H) 01/04/2019 0139   ALT 35 01/04/2019 0139   ALKPHOS 72 01/04/2019 0139   BILITOT 0.5 01/04/2019 0139   GFRNONAA >60 01/04/2019 0139   GFRAA >60 01/04/2019 0139   Lab Results  Component Value Date   CHOL 209 (H) 01/05/2019   HDL 48 01/05/2019   LDLCALC 123 (H) 01/05/2019   TRIG 188 (H) 01/05/2019   CHOLHDL 4.4 01/05/2019   Lab Results  Component Value Date   HGBA1C 5.4 11/24/2018   No results found for: [id number] Lab Results  Component Value Date   TSH 2.320 11/24/2018       ASSESSMENT AND PLAN 38 y.o. year old female  has a past medical history of Asthma, mild and Medical history non-contributory. here with     ICD-10-CM   1. Migraine without aura and with status migrainosus, not intractable  G43.001     Meghan Reeves does note improvement in intensity and frequency of migraines after starting Ajovy in January.  Unfortunately,  she continues to have daily headaches that are milder in nature.  She has a tension type headache in the bifrontal area as well as a throbbing headache in the back of her head.  She does feel that Flexeril has helped with muscle tension.  She has noted concerns of a scratchy, sore throat as well as runny nose and feeling that her body aches when she has migrainous features.  She has never been treated for seasonal allergies.  I have advised that she consider taking either Xyzal or Zyrtec over-the-counter as directed by the packaging.  I have educated her on seasonal allergies and relationship with daily headaches.  She verbalizes understanding via her interpreter.  I have also asked her to reach out to the pharmacist should she have any difficulty obtaining these medications.  I encouraged her to drink at least 4 bottles of water daily.  I have encouraged regular activity.  She reports that she has filled out paperwork for [facility] health financial assistance.  She is not turned this paperwork and.  I have given her instructions on how to do so.  She will follow-up in 6 months, sooner if needed.  She verbalizes understanding and agreement with this plan.   No orders of the defined types were placed in this encounter.    No orders of the defined types were  placed in this encounter.     I spent 15 minutes with the patient. 50% of this time was spent counseling and educating patient on plan of care and medications.    Debbora Presto, FNP-C 07/10/2019, 2:25 PM [facility] [address], [place], [place] [address] [phone]

## 2019-09-27 ENCOUNTER — Other Ambulatory Visit: Payer: Self-pay | Admitting: *Deleted

## 2019-09-27 DIAGNOSIS — G43001 Migraine without aura, not intractable, with status migrainosus: Secondary | ICD-10-CM

## 2019-09-27 MED ORDER — AJOVY 225 MG/1.5ML ~~LOC~~ SOSY: 225.0000 mL | PREFILLED_SYRINGE | SUBCUTANEOUS | 3 refills | Status: DC

## 2019-09-28 ENCOUNTER — Telehealth: Payer: Self-pay | Admitting: *Deleted

## 2019-09-28 ENCOUNTER — Telehealth: Payer: Self-pay | Admitting: Family Medicine

## 2019-09-28 NOTE — Telephone Encounter (Signed)
Mandy with the [facility] team called stating they are missing the ICD-10 diagnoses code for the migraines. Please follow up.

## 2019-09-28 NOTE — Telephone Encounter (Signed)
Ajovy assistance application physician portion completed and signed (patient had previously completed in office). Form faxed to [employer]. Received a receipt of confirmation.

## 2019-09-28 NOTE — Telephone Encounter (Signed)
I called and spoke to [place] with [employer].  I relayed returning call from them about ICD 10:  I relayed that this is G43.001.  They have reached out to pt. Awaiting her call back.

## 2019-10-02 ENCOUNTER — Telehealth: Payer: Self-pay | Admitting: Family Medicine

## 2019-10-16 NOTE — Telephone Encounter (Signed)
Error

## 2019-11-06 ENCOUNTER — Ambulatory Visit: Payer: Self-pay

## 2019-12-13 ENCOUNTER — Ambulatory Visit (INDEPENDENT_AMBULATORY_CARE_PROVIDER_SITE_OTHER): Payer: Self-pay | Admitting: Family Medicine

## 2019-12-13 ENCOUNTER — Encounter: Payer: Self-pay | Admitting: Family Medicine

## 2019-12-13 ENCOUNTER — Telehealth: Payer: Self-pay

## 2019-12-13 ENCOUNTER — Other Ambulatory Visit: Payer: Self-pay

## 2019-12-13 ENCOUNTER — Other Ambulatory Visit: Payer: Self-pay | Admitting: Neurology

## 2019-12-13 VITALS — BP 116/78 | HR 88 | Temp 98.0°F | Ht 61.0 in | Wt 178.4 lb

## 2019-12-13 DIAGNOSIS — G43001 Migraine without aura, not intractable, with status migrainosus: Secondary | ICD-10-CM

## 2019-12-13 MED ORDER — AMITRIPTYLINE HCL 10 MG PO TABS: 10.0000 mg | ORAL_TABLET | Freq: Every day | ORAL | 11 refills | Status: DC

## 2019-12-13 NOTE — Telephone Encounter (Signed)
Called patient to do their pre-visit COVID screening(CJ, [id number]).  Call went to voicemail. Unable to do prescreening.

## 2019-12-13 NOTE — Patient Instructions (Signed)
Mantenimiento de Technical sales engineer en [place] [facility], Female Adoptar un estilo de vida saludable y recibir atencin preventiva son importantes para promover la salud y Musician. Consulte al mdico sobre:  El esquema adecuado para hacerse pruebas y exmenes peridicos.  Cosas que puede hacer por su cuenta para prevenir enfermedades y [employer]. Qu debo saber sobre la dieta, el peso y el ejercicio? Consuma una dieta saludable   Consuma una dieta que incluya muchas verduras, frutas, productos lcteos con bajo contenido de [place] y Advertising account planner.  No consuma muchos alimentos ricos en grasas slidas, azcares agregados o sodio. Mantenga un peso saludable El ndice de masa muscular [facility] [place]) se [place] para identificar problemas de [place]. Proporciona una estimacin de la grasa corporal basndose en el peso y la altura. Su mdico puede ayudarle a Radiation protection practitioner [place] y a Scientist, forensic o Theatre manager un peso saludable. Haga ejercicio con regularidad Haga ejercicio con regularidad. Esta es una de las prcticas ms importantes que puede hacer por su salud. La mayora de los adultos deben seguir estas pautas:  Optometrist, al menos, 185mnutos de actividad fsica por semana. El ejercicio debe aumentar la frecuencia cardaca y hNature conservation officertranspirar (ejercicio de intensidad moderada).  Hacer ejercicios de fortalecimiento por lo mHalliburton Companypor semana. Agregue esto a su plan de ejercicio de intensidad moderada.  Pasar menos tiempo sentados. Incluso la actividad fsica ligera puede ser beneficiosa. Controle sus niveles de colesterol y lpidos en la sangre Comience a realizarse anlisis de lpidos y cResearch officer, trade unionen la sangre a los 20aos y luego reptalos cada 5aos. Hgase controlar los niveles de colesterol con mayor frecuencia si:  Sus niveles de lpidos y colesterol son altos.  Es mayor de 40aos.  Presenta un alto riesgo de padecer enfermedades cardacas. Qu debo saber sobre las pruebas de  deteccin del cncer? Segn su historia clnica y sus antecedentes familiares, es posible que deba realizarse pruebas de deteccin del cncer en diferentes edades. Esto puede incluir pruebas de deteccin de lo siguiente:  Cncer de mama.  Cncer de cuello uterino.  Cncer colorrectal.  Cncer de piel.  Cncer de pulmn. Qu debo saber sobre la enfermedad cardaca, la diabetes y la hipertensin arterial? Presin arterial y enfermedad cardaca  La hipertensin arterial causa enfermedades cardacas y aSerbiael riesgo de accidente cerebrovascular. Es ms probable que esto se manifieste en las personas que tienen lecturas de presin arterial alta, tienen ascendencia africana o tienen sobrepeso.  Hgase controlar la presin arterial: ? Cada 3 a 5 aos si tiene entre 18 y 389aos. ? Todos los aos si es mayor de 4[place] Diabetes Realcese exmenes de deteccin de la diabetes con regularidad. Este anlisis revisa el nivel de azcar en la sangre en aStronghurst Hgase las pruebas de deteccin:  Cada tresaos despus de los 428aosde edad si tiene un peso normal y un bajo riesgo de padecer diabetes.  Con ms frecuencia y a partir de uSt. Petersedad inferior si tiene sobrepeso o un alto riesgo de padecer diabetes. Qu debo saber sobre la prevencin de infecciones? Hepatitis B Si tiene un riesgo ms alto de contraer hepatitis B, debe someterse a un examen de deteccin de este virus. Hable con el mdico para averiguar si tiene riesgo de contraer la infeccin por hepatitis B. Hepatitis C Se recomienda el anlisis a:  THexion Specialty Chemicals1945 y 1965.  Todas las personas que tengan un riesgo de haber contrado hepatitis C. Enfermedades de transmisin sexual (ETS)  Hgase las  pruebas de deteccin de ITS, incluidas la gonorrea y la clamidia, si: ? Es sexualmente activa y es menor de 24aos. ? Es mayor de 24aos, y el mdico le informa que corre riesgo de tener este tipo de  infecciones. ? La actividad sexual ha cambiado desde que le hicieron la ltima prueba de deteccin y tiene un riesgo mayor de tener clamidia o gonorrea. Pregntele al mdico si usted tiene riesgo.  Pregntele al mdico si usted tiene un alto riesgo de contraer VIH. El mdico tambin puede recomendarle un medicamento recetado para ayudar a evitar la infeccin por el VIH. Si elige tomar medicamentos para prevenir el VIH, primero debe hacerse los anlisis de deteccin del VIH. Luego debe hacerse anlisis cada 3meses mientras est tomando los medicamentos. Embarazo  Si est por dejar de menstruar (fase premenopusica) y usted puede quedar embarazada, busque asesoramiento antes de quedar embarazada.  Tome de 400 a 800microgramos (mcg) de cido flico todos los das si queda embarazada.  Pida mtodos de control de la natalidad (anticonceptivos) si desea evitar un embarazo no deseado. Osteoporosis y menopausia La osteoporosis es una enfermedad en la que los huesos pierden los minerales y la fuerza por el avance de la edad. El resultado pueden ser fracturas en los huesos. Si tiene 65aos o ms, o si est en riesgo de sufrir osteoporosis y fracturas, pregunte a su mdico si debe:  Hacerse pruebas de deteccin de prdida sea.  Tomar un suplemento de calcio o de vitamina D para reducir el riesgo de fracturas.  Recibir terapia de reemplazo hormonal (TRH) para tratar los sntomas de la menopausia. Siga estas instrucciones en su casa: Estilo de vida  No consuma ningn producto que contenga nicotina o tabaco, como cigarrillos, cigarrillos electrnicos y tabaco de mascar. Si necesita ayuda para dejar de fumar, consulte al mdico.  No consuma drogas.  No comparta agujas.  Solicite ayuda a su mdico si necesita apoyo o informacin para abandonar las drogas. Consumo de alcohol  No beba alcohol si: ? Su mdico le indica no hacerlo. ? Est embarazada, puede estar embarazada o est tratando de quedar  embarazada.  Si bebe alcohol: ? Limite la cantidad que consume de 0 a 1 medida por da. ? Limite la ingesta si est amamantando.  Est atento a la cantidad de alcohol que hay en las bebidas que toma. En los [place], una medida equivale a una botella de cerveza de 12oz (355ml), un vaso de vino de 5oz (148ml) o un vaso de una bebida alcohlica de alta graduacin de 1oz (44ml). Instrucciones generales  Realcese los estudios de rutina de la salud, dentales y de la vista.  Mantngase al da con las vacunas.  Infrmele a su mdico si: ? Se siente deprimida con frecuencia. ? Alguna vez ha sido vctima de maltrato o no se siente segura en su casa. Resumen  Adoptar un estilo de vida saludable y recibir atencin preventiva son importantes para promover la salud y el bienestar.  Siga las instrucciones del mdico acerca de una dieta saludable, el ejercicio y la realizacin de pruebas o exmenes para detectar enfermedades.  Siga las instrucciones del mdico con respecto al control del colesterol y la presin arterial. Esta informacin no tiene como fin reemplazar el consejo del mdico. Asegrese de hacerle al mdico cualquier pregunta que tenga. Document Revised: 11/16/2018 Document Reviewed: 11/16/2018 Elsevier Patient Education  2020 Elsevier Inc.  

## 2019-12-13 NOTE — Progress Notes (Addendum)
PATIENT: Meghan Reeves DOB: Mar 09, 1981  REASON FOR VISIT: follow up HISTORY FROM: patient  Chief Complaint  Patient presents with  . Follow-up    6 mon f/u. Interpreter present. Rm 1. She stated that she would like to discuss her Ajovy. She stated that she doesn't have insurance and she cant afford it even though she applied for fianancial assistance. She would like to know is there another less expensive option. She would also like to know the price of the medication as well.      HISTORY OF PRESENT ILLNESS: Today 12/13/19 Meghan Reeves is a 39 y.o. female here today for follow up for migraines. She is doing well on Ajovy. She is having about 3-4 migraines per month. Unfortunately, she is uninsured and is unable to continue Ajovy. She has been receiving samples for the past year. She has one injection left. We have tried to help her with [employer] assistance program as well as patient assistance with Ajovy. She is not a [place] citizen and does not qualify for assistance programs. She is asking to start another medication that is more affordable. She has tried topiramate in the past that caused worsening anxiety.   Meghan Reeves, Meghan Reeves interpreter, assists with visit today   HISTORY: (copied from my note on 07/10/2019)  Meghan Reeves is a 39 y.o. female here today for follow up for migraines.  She is here with an interpreter who aids in obtaining history.  She has continued Ajovy injections monthly.  She does feel that these injections have helped some with reducing the intensity of her headaches.  She was given cyclobenzaprine at last visit.  She does feel that this helps with muscle tension.  She continues to have daily headaches. She reports pressure across her forehead. She has a throbbing sensation in the back of her head.  She does note runny nose, sore throat and body aches when she has worsening headaches.  She has not been treated for seasonal  allergies in the past.  She drinks 2 bottles of water daily.  She does take ibuprofen at least 3-4 times weekly.  She has taken rizatriptan once but is not sure it helped.  HISTORY: (copied from Dr Cathren Laine note on 06/07/2019)  Interval history 06/07/2019:The headaches are better.Here with interpreter.Not as strong a little better. She takes Tylenol for back pain. She stopped the Fioricet. She has 4-5 headache days a month. Will help with [facility] Financial assistance. Will give samples of Ajovy for 6 months until then and try to get it approved through [place] assistance as well. She has tension in the back of the head, tightness.  [id number] Meghan Reeves a 39 y.o.femalehere as requested by Emergency Room for headaches. She reports that she has headaches everyday. Headaches started several months ago. Milder headaches are described as a pressure sensation across the top of her head. Pressure is throbbing. Rated 5-6/10. She is nauseated at times with mild headaches. She has more severe headaches about 1-2 times a week. She describes these headaches as more unilateral (side varies). She feels a pounding sensation. Rated 7-8/10. These headaches are associated with sound and light sensitivity. She has a vision disturbance described as a white veil that she can see on the same side she is having headache. She is nauseated with headaches but does not vomit. She denies dizziness or syncope with headaches. She feels that stress is a trigger. She has not noticed any alleviating factors with exception of rest.  She has noticed that headaches last all day if not treated. She is taking Fioricet twice daily most days (occasionally once daily.) She was given prescription for 90 tablets of Fioricet on 12/11 and has about 7-8 tablets left. She was given Topamax at one UC visit that caused numbness of her lip and she saw flashy lights. She took this for about a week then stopped. Here with interpreter.  No other focal neurologic deficits, associated symptoms, inciting events or modifiable factors.  She does have trouble going to sleep. She may fall asleep around 2-3am and gets up around 9am. She does not snore. She rarely wakes up with a headache. She denies dry mouth in the mornings.   She is taking Lexapro daily for anxiety. She feels that this medication is helping some but she feels sleepy during the day.   She had a normal CT of head in 09/2018. She has never had an MRI. She does not drink or smoke. She has a sister with migraines that is treated with Advil.   Reviewed notes, labs and imaging from outside physicians, which showed: Patient was referred from the emergency room. She was seen at the emergency room in November 2019. She is also seen Molli Barrows as well in the office earlier this month. She reported having lots of headaches for the last couple months. She had 3 ER visits into urgent care visits for the same. She was taking Advil migraine on a daily basis. She was tried on Topamax for prevention 100 mg at bedtime and she had side effects including anxiety.  Reviewed office notes from Dr. Kenton Kingfisher. Patient was seen in the emergency room multiple times. She is been taking a combination of pseudoephedrine and antihistamine and feels headache improved. She reports headaches occur almost daily. Localized to the left side of the head occasionally is like a tension sensation across the bilateral head. Try Topamax did not feel the medication work and discontinued it. Endorsed nausea and light sensitivity when headaches occur. She was given Fioricet 90 capsules.   REVIEW OF SYSTEMS: Out of a complete 14 system review of symptoms, the patient complains only of the following symptoms, headaches and all other reviewed systems are negative.   ALLERGIES: Allergies  Allergen Reactions  . Topamax [Topiramate] Other (See Comments)    Blurry vision, numbness of lips    HOME  MEDICATIONS: Outpatient Medications Prior to Visit  Medication Sig Dispense Refill  . Fremanezumab-vfrm (AJOVY) 225 MG/1.5ML SOSY Inject 225 mLs into the skin every 30 (thirty) days. 1.5 mL 3  . ibuprofen (ADVIL,MOTRIN) 200 MG tablet Take 200 mg by mouth every 6 (six) hours as needed for mild pain.    Marland Kitchen metoCLOPramide (REGLAN) 10 MG tablet Take 1 tablet (10 mg total) by mouth 3 (three) times daily as needed for nausea (or headache). 15 tablet 11  . rizatriptan (MAXALT) 10 MG tablet Take 1 tablet (10 mg total) by mouth as needed for migraine. May repeat in 2 hours if needed 10 tablet 11   No facility-administered medications prior to visit.    PAST MEDICAL HISTORY: Past Medical History:  Diagnosis Date  . Asthma, mild   . Medical history non-contributory     PAST SURGICAL HISTORY: Past Surgical History:  Procedure Laterality Date  . APPENDECTOMY  09/2016  . CESAREAN SECTION N/A 09/07/2017   Procedure: CESAREAN SECTION;  Surgeon: Mora Bellman, MD;  Location: [facility];  Service: Obstetrics;  Laterality: N/A;  . LAPAROSCOPIC APPENDECTOMY N/A 09/29/2016  Procedure: APPENDECTOMY LAPAROSCOPIC;  Surgeon: Greer Pickerel, MD;  Location: [facility];  Service: General;  Laterality: N/A;  . NO PAST SURGERIES      FAMILY HISTORY: Family History  Problem Relation Age of Onset  . Asthma Mother   . Migraines Sister     SOCIAL HISTORY: Social History   Socioeconomic History  . Marital status: Significant Other    Spouse name: Not on file  . Number of children: 1  . Years of education: Not on file  . Highest education level: 6th grade  Occupational History  . Not on file  Tobacco Use  . Smoking status: Former Smoker    Packs/day: 0.25    Years: 10.00    Pack years: 2.50    Types: Cigarettes  . Smokeless tobacco: Never Used  Substance and Sexual Activity  . Alcohol use: No    Alcohol/week: 0.0 standard drinks  . Drug use: No  . Sexual activity: Yes    Birth  control/protection: None  Other Topics Concern  . Not on file  Social History Narrative   ** Merged History Encounter **       Lives at home with boyfriend  Right handed Caffeine: 30 oz daily of coca cola   Social Determinants of Health   Financial Resource Strain:   . Difficulty of Paying Living Expenses: Not on file  Food Insecurity:   . Worried About Charity fundraiser in the Last Year: Not on file  . Ran Out of Food in the Last Year: Not on file  Transportation Needs:   . Lack of Transportation (Medical): Not on file  . Lack of Transportation (Non-Medical): Not on file  Physical Activity:   . Days of Exercise per Week: Not on file  . Minutes of Exercise per Session: Not on file  Stress:   . Feeling of Stress : Not on file  Social Connections:   . Frequency of Communication with Friends and Family: Not on file  . Frequency of Social Gatherings with Friends and Family: Not on file  . Attends Religious Services: Not on file  . Active Member of Clubs or Organizations: Not on file  . Attends Archivist Meetings: Not on file  . Marital Status: Not on file  Intimate Partner Violence:   . Fear of Current or Ex-Partner: Not on file  . Emotionally Abused: Not on file  . Physically Abused: Not on file  . Sexually Abused: Not on file      PHYSICAL EXAM  Vitals:   12/13/19 1512  BP: 116/78  Pulse: 88  Temp: 98 F (36.7 C)  TempSrc: Oral  Weight: 178 lb 6.4 oz (80.9 kg)  Height: 5\' 1"  (1.549 m)   Body mass index is 33.71 kg/m.  Generalized: Well developed, in no acute distress  Cardiology: normal rate and rhythm, no murmur noted Neurological examination  Mentation: Alert oriented to time, place, history taking. Follows all commands speech and language fluent Cranial nerve II-XII: Pupils were equal round reactive to light. Extraocular movements were full, visual field were full  Motor: The motor testing reveals 5 over 5 strength of all 4 extremities. Good  symmetric motor tone is noted throughout.  Gait and station: Gait is normal.   DIAGNOSTIC DATA (LABS, IMAGING, TESTING) - I reviewed patient records, labs, notes, testing and imaging myself where available.  No flowsheet data found.   Lab Results  Component Value Date   WBC 14.3 (H) 01/04/2019   HGB  13.9 01/04/2019   HCT 41.0 01/04/2019   MCV 91.5 01/04/2019   PLT 285 01/04/2019      Component Value Date/Time   NA 135 01/04/2019 0139   K 3.8 01/04/2019 0139   CL 102 01/04/2019 0139   CO2 22 01/04/2019 0139   GLUCOSE 130 (H) 01/04/2019 0139   BUN 15 01/04/2019 0139   CREATININE 0.95 01/04/2019 0139   CALCIUM 9.3 01/04/2019 0139   PROT 6.9 01/04/2019 0139   ALBUMIN 3.7 01/04/2019 0139   AST 46 (H) 01/04/2019 0139   ALT 35 01/04/2019 0139   ALKPHOS 72 01/04/2019 0139   BILITOT 0.5 01/04/2019 0139   GFRNONAA >60 01/04/2019 0139   GFRAA >60 01/04/2019 0139   Lab Results  Component Value Date   CHOL 209 (H) 01/05/2019   HDL 48 01/05/2019   LDLCALC 123 (H) 01/05/2019   TRIG 188 (H) 01/05/2019   CHOLHDL 4.4 01/05/2019   Lab Results  Component Value Date   HGBA1C 5.4 11/24/2018   No results found for: VITAMINB12 Lab Results  Component Value Date   TSH 2.320 11/24/2018       ASSESSMENT AND PLAN 39 y.o. year old female  has a past medical history of Asthma, mild and Medical history non-contributory. here with   No diagnosis found.   She is doing well on Ajovy but, unfortunately is unable to continue therapy as she is uninsured and unable to obtain insurance in the near future. We will discontinue Ajovy and start amitriptyline. I have educated her on appropriate use and potential side effects with this medication. She will continue adequate hydration and well balanced diet. Treat allergy symptoms as directed. She will call with any new or worsening symptoms, otherwise, she will follow up in 6-12 months. She verbalizes understanding and agreement with this plan.   No  orders of the defined types were placed in this encounter.    Meds ordered this encounter  Medications  . amitriptyline (ELAVIL) 10 MG tablet    Sig: Take 1 tablet (10 mg total) by mouth at bedtime.    Dispense:  30 tablet    Refill:  11    Order Specific Question:   Supervising Provider    Answer:   Melvenia Beam [id number]      I spent 15 minutes with the patient. 50% of this time was spent counseling and educating patient on plan of care and medications.    Debbora Presto, FNP-C 12/13/2019, 4:13 PM [facility] [address], [place], [place] [address] [phone]  Made any corrections needed, and agree with history, physical, neuro exam,assessment and plan as stated.     Sarina Ill, MD [facility]

## 2019-12-13 NOTE — Patient Instructions (Addendum)
We will try amitriptyline 10mg  at bedtime   Continue rizatriptan and metoclopramide as needed   Please drink 50 ounces of water daily   Follow up in 6-12 months   Amitriptyline tablets Qu es este medicamento? La AMITRIPTILINA se utiliza para tratar la depresin. Este medicamento puede ser utilizado para otros usos; si tiene alguna pregunta consulte con su proveedor de atencin mdica o con su farmacutico. MARCAS COMUNES: Elavil, Vanatrip Qu le debo informar a mi profesional de la salud antes de tomar este medicamento? Necesita saber si usted presenta alguno de los siguientes problemas o situaciones:  problemas de alcoholismo  asma, dificultad al respirar  trastorno bipolar o esquizofrenia  dificultad para orinar, problema de prstata  glaucoma  enfermedad cardiaca o ataque cardiaco previo  enfermedad heptica  hipertiroidismo  convulsiones  ideas o planes suicidos, intentos de suicidio previos o antecendentes familiares de intentos de suicidio  una reaccin alrgica o inusual a la amitriptilina, otros medicamentos, alimentos, colorantes o conservadores  si est embarazada o buscando quedar embarazada  si est amamantando a un beb Cmo debo utilizar este medicamento? [employer] medicamento por va oral con un poco de [place]. Siga las instrucciones de la etiqueta del [place]. Puede tomar las tabletas con o sin alimentos. Tome su medicamento a intervalos regulares. No lo tome con una frecuencia mayor a la indicada. No deje de tomar Coca[employer]Cola de repente a menos que as lo indique su mdico. Dejar de Insurance account manager medicamento demasiado rpido puede causar efectos secundarios graves o podra empeorar su afeccin. Su farmacutico le dar una Gua del medicamento especial (MedGuide, nombre en ingls) con cada receta y en cada ocasin que la vuelva a surtir. Asegrese de leer esta informacin cada vez cuidadosamente. Hable con su pediatra para informarse acerca  del uso de este medicamento en nios. Puede requerir atencin especial. Sobredosis: Pngase en contacto inmediatamente con un centro toxicolgico o una sala de urgencia si usted cree que haya tomado demasiado medicamento. ATENCIN: [employer] es solo para usted. No comparta este medicamento con nadie. Qu sucede si me olvido de una dosis? Si olvida una dosis, tmela lo antes posible. Si es casi la hora de la prxima dosis, tome slo esa dosis. No tome dosis adicionales o dobles. Qu puede interactuar con este medicamento? No use este medicamento con ninguno de los siguientes frmacos: trixido de arsnico ciertos medicamentos usados para regular la frecuencia cardiaca anormal o tratar otras afecciones cardiacas cisaprida droperidol halofantrina linezolida IMAO, tales como Carbex, Eldepryl, Marplan, N[place]il y Parnate azul de metileno otros medicamentos para la depresin mental fenotiazinas, tales como perfenacina, tioridazina y Chief of Staff pimozida probucol procarbazina esparfloxacino hierba de [place] Este medicamento tambin puede Counselling psychologist con los siguientes frmacos: atropina y frmacos relacionados, tales como hiosciamina, escopolamina, tolterodina y otros barbitricos para inducir el sueo o para el tratamiento de convulsiones, tal como el fenobarbital cimetidina disulfirm etclorvinol hormonas tiroideas, como levotiroxina ziprasidona Puede ser que esta lista no menciona todas las posibles interacciones. Informe a su profesional de [employer] Home	[place] de [employer] productos a base de hierbas, medicamentos de [place] o suplementos nutritivos que est tomando. Si usted fuma, consume bebidas alcohlicas o si utiliza drogas ilegales, indqueselo tambin a su profesional de [employer]	[place]. Algunas sustancias pueden interactuar con su medicamento. A qu debo estar atento al usar Coca-Cola? Informe a su mdico si sus sntomas no mejoran o si empeoran. Visite a su mdico o a Barrister's clerk de la  salud para Scientist, water quality evolucin  peridicamente. Debido que puede ser necesario tomar este medicamento durante varias semanas para que sea posible observar sus efectos en forma [place], es importante que sigue su tratamiento como recetado por su mdico. Los pacientes y sus familias deben estar atentos si empeora la depresin o ideas suicidas. Tambin est atento a cambios repentinos o severos de emocin, tales como el sentirse ansioso, agitado, lleno de pnico, irritable, hostil, agresivo, impulsivo, inquietud severa, demasiado excitado y hiperactivo o dificultad para conciliar el sueo. Si esto ocurre, especialmente al comenzar con un tratamiento antidepresivo o al cambiar de dosis, comunquese con su profesional de KB [employer]	[place]. Puede experimentar mareos o somnolencia. No conduzca ni utilice maquinaria ni haga nada que Associate Professor en estado de alerta hasta que sepa cmo le afecta este medicamento. No se siente ni se ponga de pie con rapidez, especialmente si es un paciente de edad avanzada. Esto reduce el riesgo de mareos o [employer]. El alcohol puede interferir con el efecto de este medicamento. Evite consumir bebidas alcohlicas. No se trate usted mismo si tiene tos, resfro o Set designer sin Teacher, adult education con su mdico o con su profesional de KB Home	[place]. Algunos ingredientes pueden aumentar los posibles efectos secundarios. Se le podr secar la boca. Masticar chicle sin azcar, chupar caramelos duros y tomar agua en abundancia le ayudar a mantener la boca hmeda. Si el problema no desaparece o es severo, consulte a su mdico. Este medicamento puede resecarle los ojos y provocar visin borrosa. Si [place] lentes de contacto, puede sentir ciertas molestias. Las gotas lubricantes pueden ser tiles. Si el problema no desaparece o es severo, consulte con su mdico de los ojos. Este medicamento causar estreimiento. Trate de evacuar los intestinos al menos cada 2  3 das. Si no evacua los intestinos durante 3 das,  comunquese con su mdico o con su profesional de [employer]	[place]. Este medicamento puede aumentar la sensibilidad al sol. Mantngase fuera de Administrator. Si no lo puede evitar, utilice ropa protectora y crema de Photographer. No utilice lmparas solares, camas solares ni cabinas solares. Qu efectos secundarios puedo tener al [employer] este medicamento? Efectos secundarios que debe informar a su mdico o a Barrister's clerk de la salud tan pronto como sea posible: Chief of Staff, como erupcin cutnea, comezn/picazn o urticarias, e hinchazn de la cara, los labios o la lengua ansiedad problemas respiratorios cambios en la visin confusin estado de nimo elevado, menor necesidad de dormir, pensamientos acelerados, conducta impulsiva dolor ocular ritmo cardiaco rpido, irregular sensacin de desmayos o aturdimiento, cadas sensacin de agitacin, enojo o irritabilidad fiebre con aumento de la sudoracin alucinaciones, prdida del contacto con la realidad convulsiones rigidez de los msculos ideas suicidas u otros cambios en el estado de nimo hormigueo, Social research officer, government o entumecimiento de los pies o las manos dificultad para Garment/textile technologist o cambios en el volumen de orina dificultad para conciliar el sueo cansancio o debilidad inusual vmito color amarillento de los ojos o la piel Efectos secundarios que generalmente no requieren atencin mdica (infrmelos a su mdico o a Barrister's clerk de la salud si persisten o si son molestos): cambios en el deseo o desempeo sexual cambios en el apetito o el peso estreimiento mareos boca seca nuseas cansancio temblores Higher education careers adviser Puede ser que esta lista no menciona todos los posibles efectos secundarios. Comunquese a su mdico por asesoramiento mdico [employer]. Usted puede informar los efectos secundarios a la FDA por telfono al 1-800-FDA-1088. Dnde debo guardar mi medicina? Mantngala fuera del alcance de los  nios. Gurdela a temperatura  ambiente, entre 20 y 85 grados C (10 y 89 grados F). Deseche todo el medicamento que no haya utilizado, despus de su fecha de vencimiento. ATENCIN: Este folleto es un resumen. Puede ser que no cubra toda la posible informacin. Si usted tiene preguntas acerca de esta medicina, consulte con su mdico, su farmacutico o su profesional de Technical sales engineer.  2020 Elsevier/Gold Standard (2019-06-20 00:00:00)   Cefalea migraosa Migraine Headache Una cefalea migraosa es un dolor muy intenso y punzante en uno o ambos lados de la cabeza. Este tipo de dolor de cabeza tambin puede causar otros sntomas. Puede durar desde 4horas hasta 3das. Hable con su mdico sobre las cosas que pueden causar (desencadenar) esta afeccin. Cules son las causas? Se desconoce la causa exacta de esta afeccin. Esta afeccin puede desencadenarse o ser causada por lo siguiente:  Consumo de alcohol.  Consumo de cigarrillos.  Tomar medicamentos como por ejemplo: ? Medicamentos para Best boy torcico (nitroglicerina). ? Anticonceptivos orales. ? Estrgeno. ? Algunos medicamentos para la presin arterial.  Comer o beber ciertos productos.  Hacer actividad fsica. Otros factores que pueden provocar cefalea migraosa son los siguientes:  Tener el perodo menstrual.  Glennis Brink.  Hambre.  Estrs.  No dormir lo suficiente o dormir demasiado.  Cambios climticos.  Cansancio (fatiga). Qu incrementa el riesgo?  Tener entre 25 y 38aos de edad.  Ser mujer.  Tener antecedentes familiares de cefalea migraosa.  Ser de Science writer.  Tener depresin o ansiedad.  Tener mucho sobrepeso. Cules son los signos o los sntomas?  Un dolor punzante. Este dolor puede [employer] siguientes caractersticas: ? Audiological scientist en cualquier regin de la cabeza, tanto de un lado como de [place]. ? Puede dificultar las actividades cotidianas. ? Puede empeorar con la actividad fsica. ? Puede empeorar con las  luces brillantes o los ruidos fuertes.  Otros sntomas pueden incluir: ? Ganas de vomitar (nuseas). ? Vmitos. ? Mareos. ? Sensibilidad a las luces brillantes, los ruidos fuertes o [employer].  Antes de tener una cefalea migraosa, puede recibir seales de advertencia (aura). Un aura puede incluir: ? Ver luces intermitentes o tener puntos ciegos. ? Ver puntos brillantes, halos o lneas en zigzag. ? Tener una visin en tnel o visin borrosa. ? Sentir entumecimiento u hormigueo. ? Tener dificultad para hablar. ? Tener msculos dbiles.  Algunas personas tienen sntomas despus de una cefalea migraosa (fase posdromal), como los siguientes: ? Cansancio. ? Dificultad para pensar (concentrarse). Cmo se trata?  Tomar medicamentos para: ? Best boy. ? Aliviar la sensacin de Higher education careers adviser. ? Prevenir las Psychologist, occupational.  El tratamiento tambin puede incluir lo siguiente: ? Tomar sesiones de acupuntura. ? Evitar los alimentos que provocan las cefaleas migraosas. ? Aprender Orland Jarred de controlar las funciones corporales (biorretroalimentacin). ? [place] para ayudarlo a Civil engineer, contracting y lidiar con los pensamientos negativos (terapia cognitivo conductual). Siga estas instrucciones en su casa: Medicamentos  [employer] de venta libre y los recetados solamente como se lo haya indicado el mdico.  Consulte a su mdico si el medicamento que le recetaron: ? Hace que sea necesario que evite conducir o usar maquinaria pesada. ? Puede causarle dificultad para defecar (estreimiento). Es posible que deba tomar estas medidas para prevenir o tratar los problemas para defecar:  Electronics engineer suficiente lquido para Contractor pis (la orina) de color amarillo plido.  Tomar medicamentos recetados o de [employer].  Comer alimentos ricos en fibra. Entre ellos, frijoles, cereales integrales y  frutas y verduras frescas.  Limitar los alimentos con alto contenido de grasa y Location manager.  Estos incluyen alimentos fritos o dulces. Estilo de vida  No beba alcohol.  No consuma ningn producto que contenga nicotina o tabaco, como cigarrillos, cigarrillos electrnicos y tabaco de Higher education careers adviser. Si necesita ayuda para dejar de fumar, consulte al mdico.  Duerma como mnimo 8horas todas las noches.  Limite el estrs y [place]. Indicaciones generales      Lleve un registro diario para Neurosurgeon lo que Financial trader. Registre, por ejemplo, lo siguiente: ? Lo que usted come y bebe. ? El tiempo que duerme. ? Algn cambio en lo que come o bebe. ? Algn cambio en sus medicamentos.  Si tiene una cefalea migraosa: ? Evite los factores que [employer] sntomas, como las luces brillantes. ? Resulta til acostarse en una habitacin oscura y silenciosa. ? No conduzca vehculos ni opere maquinaria pesada. ? Pregntele al mdico qu actividades son seguras para usted.  Concurra a todas las visitas de seguimiento como se lo haya indicado el mdico. Esto es importante. Comunquese con un mdico si:  Tiene una cefalea migraosa que es diferente o peor que otras que ha tenido.  Tiene ms de [address] de cefalea por mes. Solicite ayuda inmediatamente si:  La cefalea migraosa [employer].  La cefalea migraosa dura ms de 72 horas.  Tiene fiebre.  Presenta rigidez en el cuello.  Tiene dificultad para ver.  Siente debilidad en los msculos o que no puede controlarlos.  Comienza a perder el equilibrio continuamente.  Comienza a tener dificultad para caminar.  Pierde el conocimiento (se desmaya).  Tiene una convulsin. Resumen  [place] cefalea migraosa es un dolor muy intenso y punzante en uno o ambos lados de la cabeza. Estos dolores de [place] tambin pueden causar otros sntomas.  Esta afeccin puede tratarse con medicamentos y cambios en el estilo de vida.  Lleve un registro diario para Neurosurgeon lo que Chief Operating Officer.  Comunquese con un mdico si tiene una cefalea migraosa que es diferente o peor que otras que ha tenido.  Comunquese con el mdico si tiene ms de 15 das de cefalea en un mes. Esta informacin no tiene Marine scientist el consejo del mdico. Asegrese de hacerle al mdico cualquier pregunta que tenga. Document Revised: 01/06/2019 Document Reviewed: 01/06/2019 Elsevier Patient Education  [place].

## 2019-12-14 ENCOUNTER — Other Ambulatory Visit (HOSPITAL_COMMUNITY)
Admission: RE | Admit: 2019-12-14 | Discharge: 2019-12-14 | Disposition: A | Discharge status: Home / Self Care | Payer: Self-pay | Source: Ambulatory Visit | Attending: Internal Medicine | Admitting: Internal Medicine

## 2019-12-14 ENCOUNTER — Encounter: Payer: Self-pay | Admitting: Internal Medicine

## 2019-12-14 ENCOUNTER — Ambulatory Visit (INDEPENDENT_AMBULATORY_CARE_PROVIDER_SITE_OTHER): Payer: Self-pay | Admitting: Internal Medicine

## 2019-12-14 VITALS — BP 115/76 | HR 76 | Temp 97.3°F | Resp 17 | Ht 61.0 in | Wt 177.0 lb

## 2019-12-14 DIAGNOSIS — Z0000 Encounter for general adult medical examination without abnormal findings: Secondary | ICD-10-CM

## 2019-12-14 DIAGNOSIS — Z124 Encounter for screening for malignant neoplasm of cervix: Secondary | ICD-10-CM

## 2019-12-14 DIAGNOSIS — Z113 Encounter for screening for infections with a predominantly sexual mode of transmission: Secondary | ICD-10-CM

## 2019-12-14 DIAGNOSIS — Z13228 Encounter for screening for other metabolic disorders: Secondary | ICD-10-CM

## 2019-12-14 DIAGNOSIS — Z975 Presence of (intrauterine) contraceptive device: Secondary | ICD-10-CM

## 2019-12-14 NOTE — Progress Notes (Signed)
Subjective:    Meghan Reeves - 39 y.o. female MRN [id number]  Date of birth: 04/21/1981  HPI  Meghan Reeves is here for annual exam. Reports no concerns today. Has never had an abnormal PAP. Accepts STD screening. Has Nexplanon in place for >1 year. Placed at [facility] Department. Would like to have it removed because it is bothering her.    Health Maintenance Due  Topic Date Due  . PAP SMEAR-Modifier  03/21/2012    -  reports that she has quit smoking. Her smoking use included cigarettes. She has a 2.50 pack-year smoking history. She has never used smokeless tobacco. - Review of Systems: Per HPI. - Past Medical History: Patient Active Problem List   Diagnosis Date Noted  . Migraine without aura and with status migrainosus, not intractable 12/08/2018  . Medication overuse headache 12/08/2018  . Mixed headache 10/21/2018  . Post-dates pregnancy 09/06/2017  . Tobacco use disorder, continuous 02/03/2017  . Encounter for smoking cessation counseling 02/03/2017  . [redacted] weeks gestation of pregnancy 02/03/2017  . Less than [redacted] weeks gestation of pregnancy 12/23/2016  . Spanish speaking patient 10/01/2016  . Appendicitis, acute, with peritonitis s/p lap appendectomy 09/29/2016 09/29/2016  . Initiation of Depo Provera 07/29/2015   - Medications: reviewed and updated   Objective:   Physical Exam BP 115/76   Pulse 76   Temp (!) 97.3 F (36.3 C) (Temporal)   Resp 17   Ht 5\' 1"  (1.549 m)   Wt 177 lb (80.3 kg)   SpO2 98%   BMI 33.44 kg/m  Physical Exam  Constitutional: She is oriented to person, place, and time and well-developed, well-nourished, and in no distress.  HENT:  Head: Normocephalic and atraumatic.  Mouth/Throat: Oropharynx is clear and moist.  Eyes: Pupils are equal, round, and reactive to light. Conjunctivae and EOM are normal.  Neck: No thyromegaly present.  Cardiovascular: Normal rate, regular rhythm, normal heart sounds and intact distal  pulses.  No murmur heard. Pulmonary/Chest: Effort normal and breath sounds normal. No respiratory distress. She has no wheezes.  Abdominal: Soft. Bowel sounds are normal. She exhibits no distension. There is no abdominal tenderness. There is no rebound and no guarding.  Genitourinary:    Genitourinary Comments: Exam performed in the presence of a chaperone. External genitalia within normal limits.  Vaginal mucosa pink, moist, normal rugae.  Nonfriable cervix without lesions, no discharge or bleeding noted on speculum exam.  Bimanual exam revealed normal, nongravid uterus.  No cervical motion tenderness. No adnexal masses bilaterally.     Musculoskeletal:        General: No deformity or edema. Normal range of motion.     Cervical back: Normal range of motion and neck supple.  Lymphadenopathy:    She has no cervical adenopathy.  Neurological: She is alert and oriented to person, place, and time. Gait normal.  Skin: Skin is warm and dry. No rash noted. She is not diaphoretic.  Psychiatric: Mood, affect and judgment normal.           Assessment & Plan:   1. Annual physical exam Counseled on 150 minutes of exercise per week, healthy eating (including decreased daily intake of saturated fats, cholesterol, added sugars, sodium), STI prevention, routine healthcare maintenance.  2. Cervical cancer screening - Cytology - PAP([employer])  3. Screening for metabolic disorder - Comprehensive metabolic panel - Lipid panel  4. Screening for STD (sexually transmitted disease) - Cervicovaginal ancillary only - HIV Antibody (routine testing w rflx) -  RPR  5. Nexplanon in place Schedule visit to have removed per patient request.     Phill Myron, D.O. 12/14/2019, 2:55 PM Primary Care at [facility]

## 2019-12-15 LAB — CERVICOVAGINAL ANCILLARY ONLY
Bacterial Vaginitis (gardnerella): POSITIVE — AB
Candida Glabrata: NEGATIVE
Candida Vaginitis: NEGATIVE
Chlamydia: NEGATIVE
Comment: NEGATIVE
Comment: NEGATIVE
Comment: NEGATIVE
Comment: NEGATIVE
Comment: NEGATIVE
Comment: NORMAL
Neisseria Gonorrhea: NEGATIVE
Trichomonas: NEGATIVE

## 2019-12-15 LAB — COMPREHENSIVE METABOLIC PANEL
ALT: 19 IU/L (ref 0–32)
AST: 19 IU/L (ref 0–40)
Albumin/Globulin Ratio: 1.6 (ref 1.2–2.2)
Albumin: 4.2 g/dL (ref 3.8–4.8)
Alkaline Phosphatase: 88 IU/L (ref 39–117)
BUN/Creatinine Ratio: 14 (ref 9–23)
BUN: 12 mg/dL (ref 6–20)
Bilirubin Total: 0.5 mg/dL (ref 0.0–1.2)
CO2: 21 mmol/L (ref 20–29)
Calcium: 9 mg/dL (ref 8.7–10.2)
Chloride: 104 mmol/L (ref 96–106)
Creatinine, Ser: 0.83 mg/dL (ref 0.57–1.00)
GFR calc Af Amer: 104 mL/min/{1.73_m2} (ref >59)
GFR calc non Af Amer: 90 mL/min/{1.73_m2} (ref >59)
Globulin, Total: 2.6 g/dL (ref 1.5–4.5)
Glucose: 90 mg/dL (ref 65–99)
Potassium: 4.2 mmol/L (ref 3.5–5.2)
Sodium: 141 mmol/L (ref 134–144)
Total Protein: 6.8 g/dL (ref 6.0–8.5)

## 2019-12-15 LAB — RPR: RPR Ser Ql: NONREACTIVE

## 2019-12-15 LAB — LIPID PANEL
Chol/HDL Ratio: 4.4 ratio (ref 0.0–4.4)
Cholesterol, Total: 193 mg/dL (ref 100–199)
HDL: 44 mg/dL (ref >39)
LDL Chol Calc (NIH): 109 mg/dL — ABNORMAL HIGH (ref 0–99)
Triglycerides: 229 mg/dL — ABNORMAL HIGH (ref 0–149)
VLDL Cholesterol Cal: 40 mg/dL (ref 5–40)

## 2019-12-15 LAB — HIV ANTIBODY (ROUTINE TESTING W REFLEX): HIV Screen 4th Generation wRfx: NONREACTIVE

## 2019-12-18 ENCOUNTER — Other Ambulatory Visit: Payer: Self-pay | Admitting: Internal Medicine

## 2019-12-18 DIAGNOSIS — B9689 Other specified bacterial agents as the cause of diseases classified elsewhere: Secondary | ICD-10-CM

## 2019-12-18 DIAGNOSIS — N760 Acute vaginitis: Secondary | ICD-10-CM

## 2019-12-18 LAB — CYTOLOGY - PAP
Comment: NEGATIVE
Diagnosis: NEGATIVE
High risk HPV: NEGATIVE

## 2019-12-18 MED ORDER — METRONIDAZOLE 500 MG PO TABS: 500.0000 mg | ORAL_TABLET | Freq: Two times a day (BID) | ORAL | 0 refills | Status: DC

## 2019-12-19 ENCOUNTER — Telehealth: Payer: Self-pay | Admitting: Neurology

## 2019-12-19 NOTE — Telephone Encounter (Signed)
Patient in the lobby with an interpreter stating [employer] company/pharmacy needs more information to give her the medication cheaper. She can wait about 15 mins in the lobby; otherwise cal [phone]

## 2019-12-19 NOTE — Telephone Encounter (Signed)
I spoke to pt and interpreter about her medications.  She has one more ajovy , I relayed that she can use this up, and take with the amitriptyline.  The amitriptyline is to be used daily. If this does not seem to work to let us know and then medication may be changed.  They both verbalized understanding. [facility] p/w signed if pap is viable, will check with referrals.

## 2019-12-25 ENCOUNTER — Telehealth: Payer: Self-pay | Admitting: *Deleted

## 2019-12-25 NOTE — Telephone Encounter (Signed)
I called [employer], spoke to [place].  She stated that they do have PAP for pts that do not have insurance as long as they are [place] resident.  I completed service and prescription form and to Debbora Presto, NP for signature.  Will fax to them once signed.

## 2019-12-25 NOTE — Telephone Encounter (Signed)
I called and spoke to [facility] rep about PAP available for noninsured pts.  She stated if pt is [place] resident/immigrant can see if qualifies.  Will need ICD 10 on form (this is missed a lot).  Will place.  (I only see only (passport), verified with front desk).  Will proceed with trying to get assistance.

## 2019-12-25 NOTE — Telephone Encounter (Signed)
ICD 10 code we can use is G43.009

## 2019-12-25 NOTE — Telephone Encounter (Signed)
Noted  

## 2020-01-02 ENCOUNTER — Telehealth: Payer: Self-pay

## 2020-01-02 NOTE — Patient Instructions (Signed)
Nexplanon Instructions After Insertion   Keep bandage clean and dry for 24 hours   May use ice/Tylenol/Ibuprofen for soreness or pain   If you develop fever, drainage or increased warmth from incision site-contact office immediately [phone]  Instrucciones de Nexplanon despus de la eliminacin    Mantenga el vendaje limpio y seco durante 24 horas.  Puede usar hielo / Tylenol / Ibuprofeno para Conservation officer, historic buildings o el dolor  Si presenta fiebre, secrecin o aumento de Careers adviser de la incisin, comunquese con la oficina de inmediato [phone]

## 2020-01-02 NOTE — Telephone Encounter (Signed)
Called patient to do their pre-visit COVID screening([id number]).  Call went to voicemail. Unable to do prescreening.

## 2020-01-03 ENCOUNTER — Ambulatory Visit (INDEPENDENT_AMBULATORY_CARE_PROVIDER_SITE_OTHER): Payer: Self-pay | Admitting: Internal Medicine

## 2020-01-03 ENCOUNTER — Other Ambulatory Visit: Payer: Self-pay

## 2020-01-03 ENCOUNTER — Encounter: Payer: Self-pay | Admitting: Internal Medicine

## 2020-01-03 VITALS — BP 120/78 | HR 84 | Temp 97.3°F | Resp 17 | Wt 179.8 lb

## 2020-01-03 DIAGNOSIS — N760 Acute vaginitis: Secondary | ICD-10-CM

## 2020-01-03 DIAGNOSIS — Z3046 Encounter for surveillance of implantable subdermal contraceptive: Secondary | ICD-10-CM

## 2020-01-03 DIAGNOSIS — Z30011 Encounter for initial prescription of contraceptive pills: Secondary | ICD-10-CM

## 2020-01-03 DIAGNOSIS — B9689 Other specified bacterial agents as the cause of diseases classified elsewhere: Secondary | ICD-10-CM

## 2020-01-03 MED ORDER — NORETHINDRONE 0.35 MG PO TABS: 1.0000 | ORAL_TABLET | Freq: Every day | ORAL | 11 refills | Status: DC

## 2020-01-03 MED ORDER — METRONIDAZOLE 500 MG PO TABS: 500.0000 mg | ORAL_TABLET | Freq: Two times a day (BID) | ORAL | 0 refills | Status: DC

## 2020-01-03 NOTE — Progress Notes (Signed)
Patient notified of results & recommendations. Expressed understanding.

## 2020-01-03 NOTE — Progress Notes (Signed)
  Subjective:    Meghan Reeves - 39 y.o. female MRN [id number]  Date of birth: 1980-11-18  HPI  Meghan Reeves is here for Nexplanon removal. She attributes placement of Nexplanon as cause of worsening of her migraine headaches. She endorses migraines with aura. Would like to switch to a pill form a birth control. Would like to wait about one month between methods to see if headaches improve.     -  reports that she has quit smoking. Her smoking use included cigarettes. She has a 2.50 pack-year smoking history. She has never used smokeless tobacco. - Review of Systems: Per HPI. - Past Medical History: Patient Active Problem List   Diagnosis Date Noted  . Nexplanon in place 12/14/2019  . Migraine without aura and with status migrainosus, not intractable 12/08/2018  . Medication overuse headache 12/08/2018  . Tobacco use disorder, continuous 02/03/2017  . Encounter for smoking cessation counseling 02/03/2017  . Spanish speaking patient 10/01/2016  . Appendicitis, acute, with peritonitis s/p lap appendectomy 09/29/2016 09/29/2016   - Medications: reviewed and updated   Objective:   Physical Exam BP 120/78   Pulse 84   Temp (!) 97.3 F (36.3 C) (Temporal)   Resp 17   Wt 179 lb 12.8 oz (81.6 kg)   SpO2 98%   BMI 33.97 kg/m  Physical Exam  Constitutional: She is oriented to person, place, and time and well-developed, well-nourished, and in no distress. No distress.  Cardiovascular: Normal rate.  Pulmonary/Chest: Effort normal. No respiratory distress.  Musculoskeletal:        General: Normal range of motion.  Neurological: She is alert and oriented to person, place, and time.  Skin: Skin is warm and dry. She is not diaphoretic.  Psychiatric: Affect and judgment normal.      PROCEDURE NOTE: NEXPLANON  REMOVALt Patient given informed consent and signed copy in the chart. Left arm area prepped and draped in the usual sterile fashion. Five cc of  lidocaine with epinephrine 1% used for local anesthesia. A small stab incision was made close to the nexplanon with scalpel. Hemostats were used to withdraw the nexplanon. A small bandage was applied over steri strips.  No complications.Patient given follow up instructions should she experience redness, swelling at sight or fever in the next 24 hours. Patient was reminded this totally removes her nexplanon contraceptive devise. (she can now potentially conceive)     Assessment & Plan:   1. Encounter for Nexplanon removal Patient tolerated procedure well. After care instructions were given.   2. Encounter for initial prescription of contraceptive pills Discussed with patient I would advise against combined OCPs given reported migraines with aura. She elects to start POPs for contraception. Instructed to use back up method in interim and after starting medication because she plans to wait sometime before initiating POPs to see if headaches improve after removal of Nepxlanon.  - norethindrone (ORTHO MICRONOR) 0.35 MG tablet; Take 1 tablet (0.35 mg total) by mouth daily.  Dispense: 1 Package; Refill: [place], D.O. 01/03/2020, 3:06 PM Primary Care at [facility]

## 2020-01-09 NOTE — Telephone Encounter (Signed)
I followed up on the [facility] PAP and per rep, pt is not legal citizen. (she was not eligible).

## 2020-02-16 ENCOUNTER — Ambulatory Visit: Payer: Self-pay | Attending: Family Medicine

## 2020-02-16 ENCOUNTER — Other Ambulatory Visit: Payer: Self-pay

## 2020-04-06 ENCOUNTER — Other Ambulatory Visit: Payer: Self-pay

## 2020-04-06 ENCOUNTER — Encounter (HOSPITAL_COMMUNITY): Payer: Self-pay | Admitting: Emergency Medicine

## 2020-04-06 ENCOUNTER — Emergency Department (HOSPITAL_COMMUNITY)
Admission: EM | Admit: 2020-04-06 | Discharge: 2020-04-07 | Disposition: A | Discharge status: Home / Self Care | Payer: Self-pay | Attending: Emergency Medicine | Admitting: Emergency Medicine

## 2020-04-06 DIAGNOSIS — K8020 Calculus of gallbladder without cholecystitis without obstruction: Secondary | ICD-10-CM | POA: Insufficient documentation

## 2020-04-06 DIAGNOSIS — R1011 Right upper quadrant pain: Secondary | ICD-10-CM

## 2020-04-06 DIAGNOSIS — Z79899 Other long term (current) drug therapy: Secondary | ICD-10-CM | POA: Insufficient documentation

## 2020-04-06 DIAGNOSIS — K8050 Calculus of bile duct without cholangitis or cholecystitis without obstruction: Secondary | ICD-10-CM | POA: Insufficient documentation

## 2020-04-06 DIAGNOSIS — Z87891 Personal history of nicotine dependence: Secondary | ICD-10-CM | POA: Insufficient documentation

## 2020-04-06 DIAGNOSIS — Z793 Long term (current) use of hormonal contraceptives: Secondary | ICD-10-CM | POA: Insufficient documentation

## 2020-04-06 DIAGNOSIS — J45909 Unspecified asthma, uncomplicated: Secondary | ICD-10-CM | POA: Insufficient documentation

## 2020-04-06 MED ORDER — SODIUM CHLORIDE 0.9% FLUSH: 3.0000 mL | Freq: Once | INTRAVENOUS | Status: DC

## 2020-04-06 NOTE — ED Triage Notes (Signed)
Pt c/o RUQ pain that radiates to her back and shortness of breath. Symptoms have been progressing, pt states she has gallstones but has been unable to follow-up with a surgeon due to finances. Denies nausea/vomiting.

## 2020-04-07 ENCOUNTER — Emergency Department (HOSPITAL_COMMUNITY): Payer: Self-pay

## 2020-04-07 LAB — URINALYSIS, ROUTINE W REFLEX MICROSCOPIC
Bilirubin Urine: NEGATIVE
Glucose, UA: NEGATIVE mg/dL
Hgb urine dipstick: NEGATIVE
Ketones, ur: NEGATIVE mg/dL
Leukocytes,Ua: NEGATIVE
Nitrite: NEGATIVE
Protein, ur: NEGATIVE mg/dL
Specific Gravity, Urine: 1.025 (ref 1.005–1.030)
pH: 6 (ref 5.0–8.0)

## 2020-04-07 LAB — CBC
HCT: 40.7 % (ref 36.0–46.0)
Hemoglobin: 14.2 g/dL (ref 12.0–15.0)
MCH: 31.1 pg (ref 26.0–34.0)
MCHC: 34.9 g/dL (ref 30.0–36.0)
MCV: 89.1 fL (ref 80.0–100.0)
Platelets: 324 10*3/uL (ref 150–400)
RBC: 4.57 MIL/uL (ref 3.87–5.11)
RDW: 11.9 % (ref 11.5–15.5)
WBC: 11.3 10*3/uL — ABNORMAL HIGH (ref 4.0–10.5)
nRBC: 0 % (ref 0.0–0.2)

## 2020-04-07 LAB — COMPREHENSIVE METABOLIC PANEL
ALT: 29 U/L (ref 0–44)
AST: 32 U/L (ref 15–41)
Albumin: 3.7 g/dL (ref 3.5–5.0)
Alkaline Phosphatase: 84 U/L (ref 38–126)
Anion gap: 13 (ref 5–15)
BUN: 11 mg/dL (ref 6–20)
CO2: 22 mmol/L (ref 22–32)
Calcium: 8.9 mg/dL (ref 8.9–10.3)
Chloride: 102 mmol/L (ref 98–111)
Creatinine, Ser: 0.98 mg/dL (ref 0.44–1.00)
GFR calc Af Amer: >60 mL/min (ref >60)
GFR calc non Af Amer: >60 mL/min (ref >60)
Glucose, Bld: 164 mg/dL — ABNORMAL HIGH (ref 70–99)
Potassium: 3.3 mmol/L — ABNORMAL LOW (ref 3.5–5.1)
Sodium: 137 mmol/L (ref 135–145)
Total Bilirubin: 0.9 mg/dL (ref 0.3–1.2)
Total Protein: 6.9 g/dL (ref 6.5–8.1)

## 2020-04-07 LAB — I-STAT BETA HCG BLOOD, ED (MC, WL, AP ONLY): I-stat hCG, quantitative: <5 m[IU]/mL (ref <5)

## 2020-04-07 LAB — LIPASE, BLOOD: Lipase: 23 U/L (ref 11–51)

## 2020-04-07 MED ORDER — ONDANSETRON HCL 4 MG PO TABS: 4.0000 mg | ORAL_TABLET | Freq: Three times a day (TID) | ORAL | 0 refills | Status: DC | PRN

## 2020-04-07 MED ORDER — OXYCODONE HCL 5 MG PO TABS: 5.0000 mg | ORAL_TABLET | Freq: Four times a day (QID) | ORAL | 0 refills | Status: AC | PRN

## 2020-04-07 MED ORDER — OXYCODONE-ACETAMINOPHEN 5-325 MG PO TABS
1.0000 | ORAL_TABLET | Freq: Once | ORAL | Status: AC
  Administered 2020-04-07: 1 via ORAL
  Filled 2020-04-07: qty 1

## 2020-04-07 NOTE — ED Notes (Signed)
Pt transported to [facility]

## 2020-04-07 NOTE — Discharge Instructions (Signed)
You were seen in the ER for abdominal pain  Lab work is normal.  Ultrasound showed multiple gallstones in your gallbladder but no infection or obstruction  You will likely continue to have pain usually after meals due to these gallstones.  Please call general surgery clinic and make an appointment for reevaluation, they may recommend elective surgery to remove the gallbladder.  Return to the ER for worsening, constant pain despite prescribed pain medicine, fever, chest pain or shortness of breath  For pain and inflammation you can use a combination of ibuprofen and acetaminophen.  Take [phone] mg acetaminophen (tylenol) every 6 hours or 600 mg ibuprofen (advil, motrin) every 6 hours.  You can take these separately or combine them every 6 hours for maximum pain control. Do not exceed 4,000 mg acetaminophen or 2,400 mg ibuprofen in a 24 hour period.  Do not take ibuprofen containing products if you have history of kidney disease, ulcers, GI bleeding, severe acid reflux, or take a blood thinner.  Do not take acetaminophen if you have liver disease.   For break through and/or severe pain despite ibuprofen and acetaminophen regimen, take 5 mg oxycodone every 4 hours.  Oxycodone is a narcotic pain medication that has risk of overdose, death, dependence and abuse. Mild and expected side effects include nausea, stomach upset, drowsiness, constipation. Do not consume alcohol, drive or use heavy machinery while taking this medication. Do not leave unattended around children. Flush any remaining pills that you do not use and do not share.  The emergency department has a strict policy regarding prescription of narcotic medications. We prescribe a short course for acute, new pain or injuries. We are unable to refill this medication in the emergency department for chronic pain or repeatedly.  Refill need to be done by specialist or primary care provider or pain clinic.  Contact your primary care provider or  specialist for chronic pain management and refill on narcotic medications.   Ondansetron 4 mg every 8 hours for nausea   Vino a sala de emergencias por dolor abdominal  Los analisis de sangre estan normal. La ecografa mostr mltiples clculos biliares en la vescula biliar, pero sin infeccin ni obstruccin.  Es probable que contine sintiendo dolor generalmente despus de las comidas debido a estos clculos biliares.  Llame a la clnica de [place] general y programe una cita para una reevaluacin, es posible que le recomienden una ciruga electiva para removerle la vescula biliar.  Regrese a la sala de Mining engineer es constante y que [place] a pesar de los analgsicos recetados, Systems analyst, Social research officer, government de pecho o dificultad para respirar  Para el dolor y la inflamacin, puede usar una combinacin de ibuprofeno y Music therapist. Tome [phone] mg de acetaminofn (tylenol) cada 6 horas o 600 mg de ibuprofeno (advil, motrin) cada 6 horas. Puede tomarlos por separado o combinarlos cada 6 horas para obtener el mximo control del dolor. No exceda los 4,000 mg de acetaminofeno o 2,400 mg de ibuprofeno en un perodo de 24 horas. No tome productos que contengan ibuprofeno si tiene antecedentes de enfermedad renal, lceras, sangrado gastrointestinal, reflujo cido severo o toma un anticoagulante. No tome acetaminofn si tiene una enfermedad heptica.  Para el dolor intenso que no mejora con ibuprofen o acteaminofen, tome 5 mg de oxicodona cada 4 horas. La oxicodona es un analgsico narctico que tiene riesgo de sobredosis, [place], dependencia y abuso. Los efectos secundarios leves y esperados incluyen nuseas, malestar estomacal, somnolencia, estreimiento. No consuma alcohol, no conduzca ni  utilice maquinaria pesada mientras est tomando [employer]. No lo deje desatendido cerca de los nios. Elimine las pldoras restantes que no use y no comparta. El departamento de emergencias tiene una poltica  estricta con respecto a la prescripcin de medicamentos narcticos. Prescribimos un curso corto para lesiones o dolores agudos nuevos. No podemos volver a surtir Electronics engineer de emergencias para el dolor crnico o repetidamente. El resurtido debe ser realizado por Teaching laboratory technician, un proveedor de atencin primaria o una clnica del dolor. Comunquese con su proveedor de atencin primaria o especialista para el manejo del dolor crnico y reabastecimiento de medicamentos narcticos.  Ondansetron 5 mg cada 8 horas para las nauseas

## 2020-04-07 NOTE — ED Provider Notes (Signed)
[facility] EMERGENCY DEPARTMENT Provider Note   CSN: [id number] Arrival date & time: 04/06/20  2348     History Chief Complaint  Patient presents with  . Abdominal Pain    Meghan Reeves is a 39 y.o. female with known gallbladder stones presents to the ER for evaluation of "gallbladder pain".  Patient reports having intermittent pain like this for about a year.  He was told she had gallstones and told to go to general surgery but unfortunately states she was unable to do this due to finances.  She recently got her orange card and plans on following up.  States last night 4 hours after she had dinner the pain returned and it was severe.  It did not go away with Advil.  The pain is located in the right upper quadrant and it radiates to her right flank, epigastrium.  Feels like a squeeze.  Has associated nausea.  Patient reports having similar pain but milder for the last year usually after eating.  She denies any fever, vomiting.  She denies any recent exertional chest pain or shortness of breath.  Denies any changes in her bowel movements, dysuria, hematuria.  No distal extremity pain, weakness or numbness.  History of C-section.  HPI     Past Medical History:  Diagnosis Date  . Asthma, mild   . Medical history non-contributory     Patient Active Problem List   Diagnosis Date Noted  . Nexplanon in place 12/14/2019  . Migraine without aura and with status migrainosus, not intractable 12/08/2018  . Medication overuse headache 12/08/2018  . Tobacco use disorder, continuous 02/03/2017  . Encounter for smoking cessation counseling 02/03/2017  . Spanish speaking patient 10/01/2016  . Appendicitis, acute, with peritonitis s/p lap appendectomy 09/29/2016 09/29/2016    Past Surgical History:  Procedure Laterality Date  . APPENDECTOMY  09/2016  . CESAREAN SECTION N/A 09/07/2017   Procedure: CESAREAN SECTION;  Surgeon: Mora Bellman, MD;  Location: [facility];  Service: Obstetrics;  Laterality: N/A;  . LAPAROSCOPIC APPENDECTOMY N/A 09/29/2016   Procedure: APPENDECTOMY LAPAROSCOPIC;  Surgeon: Greer Pickerel, MD;  Location: [facility];  Service: General;  Laterality: N/A;  . NO PAST SURGERIES       OB History    Gravida  2   Para  1   Term  1   Preterm  0   AB  1   Living  1     SAB  1   TAB  0   Ectopic  0   Multiple      Live Births  1           Family History  Problem Relation Age of Onset  . Asthma Mother   . Migraines Sister     Social History   Tobacco Use  . Smoking status: Former Smoker    Packs/day: 0.25    Years: 10.00    Pack years: 2.50    Types: Cigarettes  . Smokeless tobacco: Never Used  Substance Use Topics  . Alcohol use: No    Alcohol/week: 0.0 standard drinks  . Drug use: No    Home Medications Prior to Admission medications   Medication Sig Start Date End Date Taking? Authorizing Provider  amitriptyline (ELAVIL) 10 MG tablet Take 1 tablet (10 mg total) by mouth at bedtime. 12/13/19   Lomax, Amy, NP  Fremanezumab-vfrm (AJOVY) 225 MG/1.5ML SOSY Inject 225 mLs into the skin every 30 (thirty) days. 09/27/19  Lomax, Amy, NP  metoCLOPramide (REGLAN) 10 MG tablet TAKE 1 TABLET(10 MG) BY MOUTH THREE TIMES DAILY AS NEEDED FOR NAUSEA OR HEADACHE 12/14/19   Lomax, Amy, NP  metroNIDAZOLE (FLAGYL) 500 MG tablet Take 1 tablet (500 mg total) by mouth 2 (two) times daily. 01/03/20   Nicolette Bang, DO  norethindrone (ORTHO MICRONOR) 0.35 MG tablet Take 1 tablet (0.35 mg total) by mouth daily. 01/03/20   Nicolette Bang, DO  ondansetron (ZOFRAN) 4 MG tablet Take 1 tablet (4 mg total) by mouth every 8 (eight) hours as needed for nausea or vomiting. 04/07/20   Kinnie Feil, PA-C  oxyCODONE (OXY IR/ROXICODONE) 5 MG immediate release tablet Take 1 tablet (5 mg total) by mouth every 6 (six) hours as needed for up to 3 days for severe pain. 04/07/20 04/10/20  Kinnie Feil, PA-C    rizatriptan (MAXALT) 10 MG tablet Take 1 tablet (10 mg total) by mouth as needed for migraine. May repeat in 2 hours if needed 06/07/19   Melvenia Beam, MD    Allergies    Topamax [topiramate]  Review of Systems   Review of Systems  Gastrointestinal: Positive for abdominal pain and nausea.  All other systems reviewed and are negative.   Physical Exam Updated Vital Signs BP 133/76   Pulse 64   Temp 98.1 F (36.7 C) (Oral)   Resp 16   SpO2 100%   Physical Exam Vitals and nursing note reviewed.  Constitutional:      Appearance: She is well-developed.     Comments: Non toxic in NAD  HENT:     Head: Normocephalic and atraumatic.     Nose: Nose normal.  Eyes:     Conjunctiva/sclera: Conjunctivae normal.  Cardiovascular:     Rate and Rhythm: Normal rate and regular rhythm.  Pulmonary:     Effort: Pulmonary effort is normal.     Breath sounds: Normal breath sounds.  Abdominal:     General: Bowel sounds are normal.     Palpations: Abdomen is soft.     Tenderness: There is abdominal tenderness in the right upper quadrant.     Comments: Mild tendenress with deep palpation of RUQ. No guarding. No G/R/R. No suprapubic or CVA tenderness. Negative Murphy's and McBurney's. Active BS to lower quadrants. Small reducible umbilical hernia, normal skin and slight discomfort with reduction.   Musculoskeletal:        General: Normal range of motion.     Cervical back: Normal range of motion.  Skin:    General: Skin is warm and dry.     Capillary Refill: Capillary refill takes less than 2 seconds.  Neurological:     Mental Status: She is alert.  Psychiatric:        Behavior: Behavior normal.     ED Results / Procedures / Treatments   Labs (all labs ordered are listed, but only abnormal results are displayed) Labs Reviewed  COMPREHENSIVE METABOLIC PANEL - Abnormal; Notable for the following components:      Result Value   Potassium 3.3 (*)    Glucose, Bld 164 (*)    All other  components within normal limits  CBC - Abnormal; Notable for the following components:   WBC 11.3 (*)    All other components within normal limits  URINALYSIS, ROUTINE W REFLEX MICROSCOPIC - Abnormal; Notable for the following components:   APPearance HAZY (*)    All other components within normal limits  LIPASE, BLOOD  I-STAT  BETA HCG BLOOD, ED ([facility], [facility], [facility] ONLY)    EKG EKG Interpretation  Date/Time:  Saturday Apr 06 2020 23:50:46 EDT Ventricular Rate:  82 PR Interval:  126 QRS Duration: 76 QT Interval:  394 QTC Calculation: 460 R Axis:   66 Text Interpretation: Normal sinus rhythm Normal ECG Confirmed by Lajean Saver [phone]) on 04/07/2020 9:04:38 AM   Radiology US Abdomen Limited RUQ  Result Date: 04/07/2020 CLINICAL DATA:  Right upper quadrant pain EXAM: ULTRASOUND ABDOMEN LIMITED RIGHT UPPER QUADRANT COMPARISON:  None. FINDINGS: Gallbladder: Within the gallbladder, there are echogenic foci which move and shadow consistent with cholelithiasis. Largest gallstone measures 5 mm in length. No gallbladder wall thickening or pericholecystic fluid. No sonographic Murphy sign noted by sonographer. Common bile duct: Diameter: 5 mm. No intrahepatic or extrahepatic biliary duct dilatation. Liver: No focal lesion identified. Within normal limits in parenchymal echogenicity. Portal vein is patent on color Doppler imaging with normal direction of blood flow towards the liver. Other: None. IMPRESSION: Cholelithiasis. No gallbladder wall thickening or pericholecystic fluid. Study otherwise unremarkable. Electronically Signed   By: Lowella Grip III M.D.   On: 04/07/2020 08:52    Procedures Procedures (including critical care time)  Medications Ordered in ED Medications  sodium chloride flush (NS) 0.9 % injection 3 mL (3 mLs Intravenous Not Given 04/07/20 0828)  oxyCODONE-acetaminophen (PERCOCET/ROXICET) 5-325 MG per tablet 1 tablet (1 tablet Oral Given 04/07/20 [id number])    ED Course  I have  reviewed the triage vital signs and the nursing notes.  Pertinent labs & imaging results that were available during my care of the patient were reviewed by me and considered in my medical decision making (see chart for details).  Clinical Course as of Apr 08 927  Sun Apr 07, 2020  [id number] Potassium(!): 3.3 [CG]  0813 WBC(!): 11.3 [CG]  0813 Appearance(!): HAZY [CG]  0907 IMPRESSION: Cholelithiasis. No gallbladder wall thickening or pericholecystic fluid.  Study otherwise unremarkable.    US Abdomen Limited RUQ [CG]  0907 Normal sinus rhythm Normal ECG Confirmed by Lajean Saver [phone]) on 04/07/2020 9:04:38 AM  ED EKG [CG]    Clinical Course User Index [CG] Kinnie Feil, PA-C   MDM Rules/Calculators/A&P                      Patient's EMR and nursing notes reviewed to assist with history and MDM.  Seen approximately 1 year ago for similar pain and noted to have multiple gallstones.  Unable to follow-up with general surgery due to finances.  Now has orange card.  Exam reveals focal right upper quadrant tenderness but no guarding.  Negative Murphy's.  No CVA tenderness.  ER work-up initiated in triage personally reviewed and interpreted-no leukocytosis.  Normal LFTs and lipase.  Urinalysis without signs of infection or RBCs.  She has no urinary symptoms.  We will add right upper quadrant ultrasound.  Will order Percocet for pain.  Reassess.  0926: Patient reevaluated and has had mild improvement in her pain.  Discussed ultrasound findings confirm multiple gallstones but no cholecystitis, obstruction.  DDx includes symptomatic biliary colic, GERD.  Patient reported mild epigastric chest discomfort but ACS, cardiac etiology is less likely given her postprandial symptoms, chronicity of symptoms and ultrasound findings.  Recommended high doses of Tylenol, ibuprofen and Zofran for symptom control.  Recommended diet changes.  Will give short prescription for oxycodone.   Encouraged follow-up with general surgery.  Patient in agreement. Final Clinical Impression(s) / ED Diagnoses  Final diagnoses:  RUQ abdominal pain  Biliary colic  Calculus of gallbladder without cholecystitis without obstruction    Rx / DC Orders ED Discharge Orders         Ordered    oxyCODONE (OXY IR/ROXICODONE) 5 MG immediate release tablet  Every 6 hours PRN     04/07/20 0920    ondansetron (ZOFRAN) 4 MG tablet  Every 8 hours PRN     04/07/20 0920           Kinnie Feil, PA-C 04/07/20 [id number]    Lajean Saver, MD 04/07/20 [phone]

## 2020-04-09 ENCOUNTER — Telehealth: Payer: Self-pay | Admitting: Internal Medicine

## 2020-04-09 NOTE — Telephone Encounter (Signed)
Pt came in to request a change in referral. Pt states she was seen by the hospital on 04/06/2020 and was referred to [facility] surgery which does not accept her CAFA coverage. Please resend referral to general surgery in [place] in order for pt to receive a 100% discount.

## 2020-04-17 ENCOUNTER — Other Ambulatory Visit: Payer: Self-pay | Admitting: Internal Medicine

## 2020-04-17 DIAGNOSIS — K8050 Calculus of bile duct without cholangitis or cholecystitis without obstruction: Secondary | ICD-10-CM

## 2020-04-17 NOTE — Telephone Encounter (Signed)
I put in a new referral for patient for Gen Surg. Thanks!   Phill Myron, D.O. Primary Care at [facility]  04/17/2020, 2:36 PM

## 2020-04-22 NOTE — Telephone Encounter (Signed)
Good Afternoon   Thank you  Dr  Juleen China   Patient have an appointment at [facility] Surgery  6/17 /21 @ 11:30AM

## 2020-04-22 NOTE — Telephone Encounter (Signed)
Wonderful, thank you!   Phill Myron, D.O. Primary Care at [facility]  04/22/2020, 4:11 PM

## 2020-04-25 ENCOUNTER — Encounter: Payer: Self-pay | Admitting: Surgery

## 2020-04-25 ENCOUNTER — Ambulatory Visit (INDEPENDENT_AMBULATORY_CARE_PROVIDER_SITE_OTHER): Payer: Self-pay | Admitting: Surgery

## 2020-04-25 ENCOUNTER — Other Ambulatory Visit: Payer: Self-pay

## 2020-04-25 ENCOUNTER — Ambulatory Visit: Payer: Self-pay | Admitting: Surgery

## 2020-04-25 ENCOUNTER — Other Ambulatory Visit
Admission: RE | Admit: 2020-04-25 | Discharge: 2020-04-25 | Disposition: A | Discharge status: Home / Self Care | Payer: HRSA Program | Source: Ambulatory Visit | Attending: Surgery | Admitting: Surgery

## 2020-04-25 VITALS — BP 127/85 | HR 87 | Temp 97.7°F | Resp 12 | Ht 64.0 in | Wt 190.0 lb

## 2020-04-25 DIAGNOSIS — K432 Incisional hernia without obstruction or gangrene: Secondary | ICD-10-CM | POA: Insufficient documentation

## 2020-04-25 DIAGNOSIS — Z20822 Contact with and (suspected) exposure to covid-19: Secondary | ICD-10-CM | POA: Insufficient documentation

## 2020-04-25 DIAGNOSIS — Z01812 Encounter for preprocedural laboratory examination: Secondary | ICD-10-CM | POA: Insufficient documentation

## 2020-04-25 DIAGNOSIS — K8010 Calculus of gallbladder with chronic cholecystitis without obstruction: Secondary | ICD-10-CM

## 2020-04-25 DIAGNOSIS — M6208 Separation of muscle (nontraumatic), other site: Secondary | ICD-10-CM

## 2020-04-25 LAB — SARS CORONAVIRUS 2 (TAT 6-24 HRS): SARS Coronavirus 2: NEGATIVE

## 2020-04-25 NOTE — H&P (View-Only) (Signed)
Patient ID: Meghan Reeves, female   DOB: May 01, 1981, 39 y.o.   MRN: [id number]  Chief Complaint: Right upper quadrant abdominal pain  History of Present Illness  Interpreter present for entirety of appointment.  Meghan Reeves is a 39 y.o. female with a history of 3 episodes of right upper quadrant abdominal pain with radiation to the epigastrium and substernal area.  Seen in ED on May 30 where an ultrasound confirmed presence of gallstones, uncomplicated with the common bile duct diameter of 5 mm.  She has associated pyrosis, but denies nausea or vomiting.  She reports the pain has been associated with fatty food intolerance.  She denies any pain today.  Her bowel movements are irregular depending upon what she eats.  She denies any fevers or chills.  Past Medical History Past Medical History:  Diagnosis Date  . Asthma, mild   . Medical history non-contributory       Past Surgical History:  Procedure Laterality Date  . APPENDECTOMY  09/2016  . CESAREAN SECTION N/A 09/07/2017   Procedure: CESAREAN SECTION;  Surgeon: Mora Bellman, MD;  Location: [facility];  Service: Obstetrics;  Laterality: N/A;  . LAPAROSCOPIC APPENDECTOMY N/A 09/29/2016   Procedure: APPENDECTOMY LAPAROSCOPIC;  Surgeon: Greer Pickerel, MD;  Location: [facility];  Service: General;  Laterality: N/A;  . NO PAST SURGERIES      Allergies  Allergen Reactions  . Topamax [Topiramate] Other (See Comments)    Blurry vision, numbness of lips    Current Outpatient Medications  Medication Sig Dispense Refill  . amitriptyline (ELAVIL) 10 MG tablet Take 1 tablet (10 mg total) by mouth at bedtime. 30 tablet 11  . metoCLOPramide (REGLAN) 10 MG tablet TAKE 1 TABLET(10 MG) BY MOUTH THREE TIMES DAILY AS NEEDED FOR NAUSEA OR HEADACHE 15 tablet 1  . ondansetron (ZOFRAN) 4 MG tablet Take 1 tablet (4 mg total) by mouth every 8 (eight) hours as needed for nausea or vomiting. 4 tablet 0  . rizatriptan (MAXALT)  10 MG tablet Take 1 tablet (10 mg total) by mouth as needed for migraine. May repeat in 2 hours if needed 10 tablet 11   No current facility-administered medications for this visit.    Family History Family History  Problem Relation Age of Onset  . Asthma Mother   . Migraines Sister       Social History Social History   Tobacco Use  . Smoking status: Former Smoker    Packs/day: 0.25    Years: 10.00    Pack years: 2.50    Types: Cigarettes  . Smokeless tobacco: Never Used  Vaping Use  . Vaping Use: Never used  Substance Use Topics  . Alcohol use: No    Alcohol/week: 0.0 standard drinks  . Drug use: No        Review of Systems  Constitutional: Positive for malaise/fatigue.  HENT: Negative.   Eyes: Negative.   Respiratory: Negative.   Cardiovascular: Negative.   Gastrointestinal: Positive for diarrhea and heartburn.  Genitourinary: Negative.   Skin: Negative.   Neurological: Positive for headaches.  Psychiatric/Behavioral: Negative.       Physical Exam Blood pressure 127/85, pulse 87, temperature 97.7 F (36.5 C), temperature source Oral, resp. rate 12, height 5\' 4"  (1.626 m), weight 190 lb (86.2 kg), SpO2 96 %, unknown if currently breastfeeding. Last Weight  Most recent update: 04/25/2020 11:20 AM   Weight  86.2 kg (190 lb)  CONSTITUTIONAL: Well developed, and nourished, appropriately responsive and aware without distress.   EYES: Sclera non-icteric.   EARS, NOSE, MOUTH AND THROAT: Mask worn.  Hearing is intact to voice.  NECK: Trachea is midline, and there is no jugular venous distension.  LYMPH NODES:  Lymph nodes in the neck are not enlarged. RESPIRATORY:  Lungs are clear, and breath sounds are equal bilaterally. Normal respiratory effort without pathologic use of accessory muscles. CARDIOVASCULAR: Heart is regular in rate and rhythm. GI: The abdomen is soft, nontender, and nondistended.  There is subjective right upper quadrant tenderness on  palpation with deep respirations, but no Murphy sign.  There were no palpable masses. I did not appreciate hepatosplenomegaly. There were normal bowel sounds.  There is a supraumbilical fascial defect, likely incisional in nature with associated diastases recti.   MUSCULOSKELETAL:  Symmetrical muscle tone appreciated in all four extremities.    SKIN: Skin turgor is normal. No pathologic skin lesions appreciated.  NEUROLOGIC:  Motor and sensation appear grossly normal.  Cranial nerves are grossly without defect. PSYCH:  Alert and oriented to person, place and time. Affect is appropriate for situation.  Data Reviewed I have personally reviewed what is currently available of the patient's imaging, recent labs and medical records.   Labs:  CBC Latest Ref Rng & Units 04/07/2020 01/04/2019 11/24/2018  WBC 4.0 - 10.5 K/uL 11.3(H) 14.3(H) 8.7  Hemoglobin 12.0 - 15.0 g/dL 14.2 13.9 15.2  Hematocrit 36 - 46 % 40.7 41.0 43.1  Platelets 150 - 400 K/uL 324 285 299   CMP Latest Ref Rng & Units 04/07/2020 12/14/2019 01/04/2019  Glucose 70 - 99 mg/dL 164(H) 90 130(H)  BUN 6 - 20 mg/dL 11 12 15   Creatinine 0.44 - 1.00 mg/dL 0.98 0.83 0.95  Sodium 135 - 145 mmol/L 137 141 135  Potassium 3.5 - 5.1 mmol/L 3.3(L) 4.2 3.8  Chloride 98 - 111 mmol/L 102 104 102  CO2 22 - 32 mmol/L 22 21 22   Calcium 8.9 - 10.3 mg/dL 8.9 9.0 9.3  Total Protein 6.5 - 8.1 g/dL 6.9 6.8 6.9  Total Bilirubin 0.3 - 1.2 mg/dL 0.9 0.5 0.5  Alkaline Phos 38 - 126 U/L 84 88 72  AST 15 - 41 U/L 32 19 46(H)  ALT 0 - 44 U/L 29 19 35      Imaging: Radiology review:  ULTRASOUND ABDOMEN LIMITED RIGHT UPPER QUADRANT  COMPARISON:  None.  FINDINGS: Gallbladder:  Within the gallbladder, there are echogenic foci which move and shadow consistent with cholelithiasis. Largest gallstone measures 5 mm in length. No gallbladder wall thickening or pericholecystic fluid. No sonographic Murphy sign noted by sonographer.  Common bile  duct:  Diameter: 5 mm. No intrahepatic or extrahepatic biliary duct dilatation.  Liver:  No focal lesion identified. Within normal limits in parenchymal echogenicity. Portal vein is patent on color Doppler imaging with normal direction of blood flow towards the liver.  Other: None.  IMPRESSION: Cholelithiasis. No gallbladder wall thickening or pericholecystic fluid.  Study otherwise unremarkable.   Electronically Signed   By: Lowella Grip III M.D.   On: 04/07/2020 08:52  Within last 24 hrs: No results found.  Assessment    Chronic calculus cholecystitis Ventral hernia, diastasis recti.  Patient Active Problem List   Diagnosis Date Noted  . Nexplanon in place 12/14/2019  . Migraine without aura and with status migrainosus, not intractable 12/08/2018  . Medication overuse headache 12/08/2018  . Tobacco use disorder, continuous 02/03/2017  . Encounter for smoking  cessation counseling 02/03/2017  . Spanish speaking patient 10/01/2016  . Appendicitis, acute, with peritonitis s/p lap appendectomy 09/29/2016 09/29/2016    Plan    Robotic cholecystectomy, open repair of ventral hernia.   This patient was seen and examined, and I concur with the H&P associated with this note.  This was discussed thoroughly.  Optimal plan is for robotic cholecystectomy.  Risks and benefits have been discussed with the patient which include but are not limited to anesthesia, bleeding, infection, biliary ductal injury or stenosis, other associated unanticipated injuries affiliated with laparoscopic surgery.  I believe there is the desire to proceed, interpreter utilized as needed.  Questions elicited and answered to satisfaction.  No guarantees ever expressed or implied.  Face-to-face time spent with the patient and accompanying care providers(if present) was 30 minutes, with more than 50% of the time spent counseling, educating, and coordinating care of the patient.      Ronny Bacon M.D., FACS 04/25/2020, 12:03 PM

## 2020-04-25 NOTE — Progress Notes (Signed)
Patient ID: Meghan Reeves, female   DOB: 09/23/1981, 39 y.o.   MRN: [id number]  Chief Complaint: Right upper quadrant abdominal pain  History of Present Illness  Interpreter present for entirety of appointment.  Meghan Reeves is a 39 y.o. female with a history of 3 episodes of right upper quadrant abdominal pain with radiation to the epigastrium and substernal area.  Seen in ED on May 30 where an ultrasound confirmed presence of gallstones, uncomplicated with the common bile duct diameter of 5 mm.  She has associated pyrosis, but denies nausea or vomiting.  She reports the pain has been associated with fatty food intolerance.  She denies any pain today.  Her bowel movements are irregular depending upon what she eats.  She denies any fevers or chills.  Past Medical History Past Medical History:  Diagnosis Date   Asthma, mild    Medical history non-contributory       Past Surgical History:  Procedure Laterality Date   APPENDECTOMY  09/2016   CESAREAN SECTION N/A 09/07/2017   Procedure: CESAREAN SECTION;  Surgeon: Mora Bellman, MD;  Location: [facility];  Service: Obstetrics;  Laterality: N/A;   LAPAROSCOPIC APPENDECTOMY N/A 09/29/2016   Procedure: APPENDECTOMY LAPAROSCOPIC;  Surgeon: Greer Pickerel, MD;  Location: [facility];  Service: General;  Laterality: N/A;   NO PAST SURGERIES      Allergies  Allergen Reactions   Topamax [Topiramate] Other (See Comments)    Blurry vision, numbness of lips    Current Outpatient Medications  Medication Sig Dispense Refill   amitriptyline (ELAVIL) 10 MG tablet Take 1 tablet (10 mg total) by mouth at bedtime. 30 tablet 11   metoCLOPramide (REGLAN) 10 MG tablet TAKE 1 TABLET(10 MG) BY MOUTH THREE TIMES DAILY AS NEEDED FOR NAUSEA OR HEADACHE 15 tablet 1   ondansetron (ZOFRAN) 4 MG tablet Take 1 tablet (4 mg total) by mouth every 8 (eight) hours as needed for nausea or vomiting. 4 tablet 0   rizatriptan (MAXALT)  10 MG tablet Take 1 tablet (10 mg total) by mouth as needed for migraine. May repeat in 2 hours if needed 10 tablet 11   No current facility-administered medications for this visit.    Family History Family History  Problem Relation Age of Onset   Asthma Mother    Migraines Sister       Social History Social History   Tobacco Use   Smoking status: Former Smoker    Packs/day: 0.25    Years: 10.00    Pack years: 2.50    Types: Cigarettes   Smokeless tobacco: Never Used  Scientific laboratory technician Use: Never used  Substance Use Topics   Alcohol use: No    Alcohol/week: 0.0 standard drinks   Drug use: No        Review of Systems  Constitutional: Positive for malaise/fatigue.  HENT: Negative.   Eyes: Negative.   Respiratory: Negative.   Cardiovascular: Negative.   Gastrointestinal: Positive for diarrhea and heartburn.  Genitourinary: Negative.   Skin: Negative.   Neurological: Positive for headaches.  Psychiatric/Behavioral: Negative.       Physical Exam Blood pressure 127/85, pulse 87, temperature 97.7 F (36.5 C), temperature source Oral, resp. rate 12, height 5\' 4"  (1.626 m), weight 190 lb (86.2 kg), SpO2 96 %, unknown if currently breastfeeding. Last Weight  Most recent update: 04/25/2020 11:20 AM   Weight  86.2 kg (190 lb)  CONSTITUTIONAL: Well developed, and nourished, appropriately responsive and aware without distress.   EYES: Sclera non-icteric.   EARS, NOSE, MOUTH AND THROAT: Mask worn.  Hearing is intact to voice.  NECK: Trachea is midline, and there is no jugular venous distension.  LYMPH NODES:  Lymph nodes in the neck are not enlarged. RESPIRATORY:  Lungs are clear, and breath sounds are equal bilaterally. Normal respiratory effort without pathologic use of accessory muscles. CARDIOVASCULAR: Heart is regular in rate and rhythm. GI: The abdomen is soft, nontender, and nondistended.  There is subjective right upper quadrant tenderness on  palpation with deep respirations, but no Murphy sign.  There were no palpable masses. I did not appreciate hepatosplenomegaly. There were normal bowel sounds.  There is a supraumbilical fascial defect, likely incisional in nature with associated diastases recti.   MUSCULOSKELETAL:  Symmetrical muscle tone appreciated in all four extremities.    SKIN: Skin turgor is normal. No pathologic skin lesions appreciated.  NEUROLOGIC:  Motor and sensation appear grossly normal.  Cranial nerves are grossly without defect. PSYCH:  Alert and oriented to person, place and time. Affect is appropriate for situation.  Data Reviewed I have personally reviewed what is currently available of the patient's imaging, recent labs and medical records.   Labs:  CBC Latest Ref Rng & Units 04/07/2020 01/04/2019 11/24/2018  WBC 4.0 - 10.5 K/uL 11.3(H) 14.3(H) 8.7  Hemoglobin 12.0 - 15.0 g/dL 14.2 13.9 15.2  Hematocrit 36 - 46 % 40.7 41.0 43.1  Platelets 150 - 400 K/uL 324 285 299   CMP Latest Ref Rng & Units 04/07/2020 12/14/2019 01/04/2019  Glucose 70 - 99 mg/dL 164(H) 90 130(H)  BUN 6 - 20 mg/dL 11 12 15   Creatinine 0.44 - 1.00 mg/dL 0.98 0.83 0.95  Sodium 135 - 145 mmol/L 137 141 135  Potassium 3.5 - 5.1 mmol/L 3.3(L) 4.2 3.8  Chloride 98 - 111 mmol/L 102 104 102  CO2 22 - 32 mmol/L 22 21 22   Calcium 8.9 - 10.3 mg/dL 8.9 9.0 9.3  Total Protein 6.5 - 8.1 g/dL 6.9 6.8 6.9  Total Bilirubin 0.3 - 1.2 mg/dL 0.9 0.5 0.5  Alkaline Phos 38 - 126 U/L 84 88 72  AST 15 - 41 U/L 32 19 46(H)  ALT 0 - 44 U/L 29 19 35      Imaging: Radiology review:  ULTRASOUND ABDOMEN LIMITED RIGHT UPPER QUADRANT  COMPARISON:  None.  FINDINGS: Gallbladder:  Within the gallbladder, there are echogenic foci which move and shadow consistent with cholelithiasis. Largest gallstone measures 5 mm in length. No gallbladder wall thickening or pericholecystic fluid. No sonographic Murphy sign noted by sonographer.  Common bile  duct:  Diameter: 5 mm. No intrahepatic or extrahepatic biliary duct dilatation.  Liver:  No focal lesion identified. Within normal limits in parenchymal echogenicity. Portal vein is patent on color Doppler imaging with normal direction of blood flow towards the liver.  Other: None.  IMPRESSION: Cholelithiasis. No gallbladder wall thickening or pericholecystic fluid.  Study otherwise unremarkable.   Electronically Signed   By: Lowella Grip III M.D.   On: 04/07/2020 08:52  Within last 24 hrs: No results found.  Assessment    Chronic calculus cholecystitis Ventral hernia, diastasis recti.  Patient Active Problem List   Diagnosis Date Noted   Nexplanon in place 12/14/2019   Migraine without aura and with status migrainosus, not intractable 12/08/2018   Medication overuse headache 12/08/2018   Tobacco use disorder, continuous 02/03/2017   Encounter for smoking  cessation counseling 02/03/2017   Spanish speaking patient 10/01/2016   Appendicitis, acute, with peritonitis s/p lap appendectomy 09/29/2016 09/29/2016    Plan    Robotic cholecystectomy, open repair of ventral hernia.   This patient was seen and examined, and I concur with the H&P associated with this note.  This was discussed thoroughly.  Optimal plan is for robotic cholecystectomy.  Risks and benefits have been discussed with the patient which include but are not limited to anesthesia, bleeding, infection, biliary ductal injury or stenosis, other associated unanticipated injuries affiliated with laparoscopic surgery.  I believe there is the desire to proceed, interpreter utilized as needed.  Questions elicited and answered to satisfaction.  No guarantees ever expressed or implied.  Face-to-face time spent with the patient and accompanying care providers(if present) was 30 minutes, with more than 50% of the time spent counseling, educating, and coordinating care of the patient.      Ronny Bacon M.D., FACS 04/25/2020, 12:03 PM

## 2020-04-25 NOTE — Patient Instructions (Addendum)
Our surgery scheduler will call you to schedule your surgery. Please have the Pink  surgery form available when speaking with her.  Cholelithiasis  La colelitiasis tambin recibe el nombre de "clculos biliares". Es un tipo de enfermedad de la vescula biliar. La vescula biliar es un rgano que almacena un lquido (bilis) que ayuda a Publishing copy las [place]. Los clculos biliares podran no causar sntomas (clculos biliares silenciosos) hasta provocar una obstruccin; luego, pueden causar dolor (ataque de la vescula biliar). Siga estas indicaciones en su casa:  Tome los medicamentos de venta libre y los recetados solamente como se lo haya indicado el mdico.  Mantenga un peso saludable.  Consuma alimentos saludables. Esto incluye lo siguiente: ? Comer una menor cantidad de alimentos grasos, como los alimentos fritos. ? Comer una menor cantidad de carbohidratos refinados. Los carbohidratos refinados son los panes y los cereales muy procesados, como el pan blanco y el arroz blanco. En cambio, elegir cereales integrales, como el pan integral o el arroz integral. ? Consumir ms fibra. [place], las frutas frescas y los frijoles son fuentes saludables de [place].  Concurra a todas las visitas de seguimiento como se lo haya indicado el mdico. Esto es importante. Comunquese con un mdico si:  De repente, siente dolor en el costado superior derecho del vientre (abdomen). El dolor podra extenderse hasta el hombro derecho o el pecho. Estos pueden ser sntomas de un ataque de la vescula biliar.  Siente Higher education careers adviser (tiene nuseas).  Devuelve (vomita).  Le han diagnosticado clculos biliares que no presentan sntomas y tiene lo siguiente: ? Dolor abdominal. ? Molestias, ardor o sensacin de plenitud en la parte superior del vientre (empacho). Solicite ayuda de inmediato si:  De repente, siente dolor en el costado superior derecho del vientre que dura ms de 2horas.  Tiene dolor  abdominal que dura ms de 5horas.  Tiene fiebre o siente escalofros.  Sigue sintiendo nuseas o vomitando.  Nota que la piel o la parte blanca del ojo estn amarillas (ictericia).  Hace pis (orina) de color oscuro.  Su materia fecal (heces) es de tono muy claro. Resumen  La colelitiasis tambin recibe el nombre de "clculos biliares".  La vescula biliar es un rgano que almacena un lquido (bilis) que ayuda a Publishing copy las [place].  Los clculos biliares silenciosos son clculos biliares que no causan sntomas.  Un ataque de la vescula biliar podra causar un dolor repentino en el costado superior derecho del vientre. El dolor podra extenderse hasta el hombro derecho o el pecho. Si esto ocurre, comunquese con el mdico.  Si le aparece un dolor repentino en el costado superior derecho del vientre que dura ms de 2horas, busque ayuda de inmediato. Esta informacin no tiene Marine scientist el consejo del mdico. Asegrese de hacerle al mdico cualquier pregunta que tenga. Document Revised: 04/26/2017 Document Reviewed: 04/19/2013 Elsevier Patient Education  [place] de alimentacin para problemas de vescula biliar Gallbladder Eating Plan Si tiene una afeccin de la vescula biliar, puede tener problemas para digerir las grasas. Consumir una dieta con bajo contenido de grasas puede [employer] sntomas, y puede ser beneficiosa antes y despus de [place] de extraccin de vescula biliar (colecistectoma). El mdico puede recomendarle que trabaje con un especialista en dietas y alimentacin (nutricionista) para que lo ayude a reducir la cantidad de grasas en su dieta. Consejos para seguir este plan [place] generales  Limite el consumo de grasas a menos del 30% del total de caloras diarias.  Si usted ingiere alrededor de 1800 caloras diarias, esto es menos de 60 gramos (g) de Materials engineer.  La grasa es una parte importante de una dieta saludable. Consumir  una dieta con bajo contenido de grasas puede dificultar mantener un peso corporal saludable. Pregunte a su nutricionista qu cantidad de grasas, caloras y otros nutrientes necesita diariamente.  Haga comidas pequeas y frecuentes Medical sales representative de tres comidas abundantes.  Beba de 8 a 10 vasos de lquido por [employer]. Beba suficiente lquido como para mantener la orina clara o de color amarillo plido.  Limite el consumo de alcohol a no ms de 66medida por da si es mujer y no est [place], y 84medidas por da si es hombre. Una medida equivale a 12oz (378ml) de cerveza, 5oz (170ml) de vino o 1oz (75ml) de bebidas alcohlicas de alta graduacin. Lea las etiquetas de los alimentos  Consulte la informacin nutricional en las etiquetas de los alimentos para conocer la cantidad de grasas por porcin. Elija alimentos con menos de 3 gramos de grasas por porcin. De compras  Elija alimentos saludables sin grasas o con bajo contenido de [place]. Busque las palabras "sin grasa", "bajo en grasas" o "con bajo contenido de grasas".  Evite comprar alimentos procesados o envasados. Coccin  Para cocinar opte por mtodos con bajo contenido de grasa, como hornear, hervir, Interior and spatial designer y Holiday representative.  Cocine con pequeas cantidades de grasas saludables, como aceite de [place], aceite de semilla de [place], aceite de canola o [place]. Qu alimentos se recomiendan?   [place] y verduras frescas, congeladas o enlatadas.  Cereales integrales.  Leche y yogur semidescremados y descremados.  Lysbeth Galas, aves sin piel, pescado, huevos y legumbres.  Suplementos proteicos con bajo contenido de grasas, en polvo o lquidos.  Hierbas y especias. Qu alimentos no se recomiendan?  Alimentos muy grasos. Entre estos se incluyen productos panificados, comida rpida, cortes de carne con grasa, helados, pan francs, rosquillas dulces, pizza, pan de queso, alimentos cubiertos con [place], salsas con  crema o queso.  Comidas fritas. Se incluyen papas fritas, tempura, pescado rebozado, milanesas de pollo, panes fritos y dulces.  Alimentos con [employer].  Alimentos que causan gases o meteorismo. Resumen  Una dieta de bajo contenido graso puede ser beneficiosa si tiene una afeccin de la vescula biliar o puede hacerla antes y despus de someterse a una ciruga de vescula.  Limite el consumo de grasas a menos del 30% del total de caloras diarias. Esto es casi 60 gramos de grasa si usted ingiere 1800 caloras diarias.  Haga comidas pequeas y frecuentes Medical sales representative de tres comidas abundantes. Esta informacin no tiene Marine scientist el consejo del mdico. Asegrese de hacerle al mdico cualquier pregunta que tenga. Document Revised: 06/01/2017 Document Reviewed: 06/01/2017 Elsevier Patient Education  [place].

## 2020-04-26 ENCOUNTER — Telehealth: Payer: Self-pay | Admitting: Surgery

## 2020-04-26 NOTE — Telephone Encounter (Signed)
With interpreter present patient has been advised of Pre-Admission date/time, COVID Testing date and Surgery date.  Surgery Date: 04/29/20 Preadmission Testing Date: 04/29/20 (Office, patient to arrive 2 hours early per pre-admit, arrive at 9:30 am) Covid Testing Date: 04/25/20 - patient advised to go to the [facility] ([place]) between 8a-1p   Patient has been made aware to arrive at 9:30 am on 04/29/20, again interpretor was present at time of office visit and patient voices understanding.

## 2020-04-29 ENCOUNTER — Encounter
Admission: RE | Disposition: A | Discharge status: Home / Self Care | Payer: Self-pay | Source: Home / Self Care | Attending: Surgery

## 2020-04-29 ENCOUNTER — Ambulatory Visit
Admission: RE | Admit: 2020-04-29 | Discharge: 2020-04-29 | Disposition: A | Discharge status: Home / Self Care | Payer: Self-pay | Attending: Surgery | Admitting: Surgery

## 2020-04-29 ENCOUNTER — Other Ambulatory Visit: Payer: Self-pay

## 2020-04-29 ENCOUNTER — Ambulatory Visit: Payer: Self-pay | Admitting: Anesthesiology

## 2020-04-29 ENCOUNTER — Encounter: Payer: Self-pay | Admitting: Surgery

## 2020-04-29 DIAGNOSIS — G43909 Migraine, unspecified, not intractable, without status migrainosus: Secondary | ICD-10-CM | POA: Insufficient documentation

## 2020-04-29 DIAGNOSIS — Z79899 Other long term (current) drug therapy: Secondary | ICD-10-CM | POA: Insufficient documentation

## 2020-04-29 DIAGNOSIS — M6208 Separation of muscle (nontraumatic), other site: Secondary | ICD-10-CM

## 2020-04-29 DIAGNOSIS — K8010 Calculus of gallbladder with chronic cholecystitis without obstruction: Secondary | ICD-10-CM | POA: Insufficient documentation

## 2020-04-29 DIAGNOSIS — J45909 Unspecified asthma, uncomplicated: Secondary | ICD-10-CM | POA: Insufficient documentation

## 2020-04-29 DIAGNOSIS — Z87891 Personal history of nicotine dependence: Secondary | ICD-10-CM | POA: Insufficient documentation

## 2020-04-29 DIAGNOSIS — K430 Incisional hernia with obstruction, without gangrene: Secondary | ICD-10-CM | POA: Insufficient documentation

## 2020-04-29 DIAGNOSIS — K432 Incisional hernia without obstruction or gangrene: Secondary | ICD-10-CM

## 2020-04-29 HISTORY — PX: VENTRAL HERNIA REPAIR: SHX424

## 2020-04-29 LAB — CBC WITH DIFFERENTIAL/PLATELET
Abs Immature Granulocytes: 0.02 10*3/uL (ref 0.00–0.07)
Basophils Absolute: 0 10*3/uL (ref 0.0–0.1)
Basophils Relative: 0 %
Eosinophils Absolute: 0.2 10*3/uL (ref 0.0–0.5)
Eosinophils Relative: 3 %
HCT: 39.9 % (ref 36.0–46.0)
Hemoglobin: 14.7 g/dL (ref 12.0–15.0)
Immature Granulocytes: 0 %
Lymphocytes Relative: 34 %
Lymphs Abs: 2.8 10*3/uL (ref 0.7–4.0)
MCH: 31.1 pg (ref 26.0–34.0)
MCHC: 36.8 g/dL — ABNORMAL HIGH (ref 30.0–36.0)
MCV: 84.5 fL (ref 80.0–100.0)
Monocytes Absolute: 0.5 10*3/uL (ref 0.1–1.0)
Monocytes Relative: 7 %
Neutro Abs: 4.7 10*3/uL (ref 1.7–7.7)
Neutrophils Relative %: 56 %
Platelets: 283 10*3/uL (ref 150–400)
RBC: 4.72 MIL/uL (ref 3.87–5.11)
RDW: 11.9 % (ref 11.5–15.5)
WBC: 8.3 10*3/uL (ref 4.0–10.5)
nRBC: 0 % (ref 0.0–0.2)

## 2020-04-29 LAB — COMPREHENSIVE METABOLIC PANEL
ALT: 19 U/L (ref 0–44)
AST: 22 U/L (ref 15–41)
Albumin: 3.9 g/dL (ref 3.5–5.0)
Alkaline Phosphatase: 77 U/L (ref 38–126)
Anion gap: 4 — ABNORMAL LOW (ref 5–15)
BUN: 13 mg/dL (ref 6–20)
CO2: 26 mmol/L (ref 22–32)
Calcium: 8.6 mg/dL — ABNORMAL LOW (ref 8.9–10.3)
Chloride: 107 mmol/L (ref 98–111)
Creatinine, Ser: 0.77 mg/dL (ref 0.44–1.00)
GFR calc Af Amer: >60 mL/min (ref >60)
GFR calc non Af Amer: >60 mL/min (ref >60)
Glucose, Bld: 101 mg/dL — ABNORMAL HIGH (ref 70–99)
Potassium: 3.9 mmol/L (ref 3.5–5.1)
Sodium: 137 mmol/L (ref 135–145)
Total Bilirubin: 0.8 mg/dL (ref 0.3–1.2)
Total Protein: 7.2 g/dL (ref 6.5–8.1)

## 2020-04-29 LAB — POCT PREGNANCY, URINE: Preg Test, Ur: NEGATIVE

## 2020-04-29 SURGERY — CHOLECYSTECTOMY, ROBOT-ASSISTED, LAPAROSCOPIC
Anesthesia: General

## 2020-04-29 MED ORDER — BUPIVACAINE LIPOSOME 1.3 % IJ SUSP: 20.0000 mL | Freq: Once | INTRAMUSCULAR | Status: DC

## 2020-04-29 MED ORDER — FENTANYL CITRATE (PF) 100 MCG/2ML IJ SOLN
INTRAMUSCULAR | Status: AC
  Filled 2020-04-29: qty 2

## 2020-04-29 MED ORDER — OXYCODONE HCL 5 MG PO TABS: 5.0000 mg | ORAL_TABLET | Freq: Once | ORAL | Status: AC | PRN

## 2020-04-29 MED ORDER — GABAPENTIN 300 MG PO CAPS
ORAL_CAPSULE | ORAL | Status: AC
  Administered 2020-04-29: 300 mg via ORAL
  Filled 2020-04-29: qty 1

## 2020-04-29 MED ORDER — CHLORHEXIDINE GLUCONATE 0.12 % MT SOLN: 15.0000 mL | Freq: Once | OROMUCOSAL | Status: AC

## 2020-04-29 MED ORDER — BUPIVACAINE LIPOSOME 1.3 % IJ SUSP
INTRAMUSCULAR | Status: AC
  Filled 2020-04-29: qty 20

## 2020-04-29 MED ORDER — LIDOCAINE HCL (CARDIAC) PF 100 MG/5ML IV SOSY
PREFILLED_SYRINGE | INTRAVENOUS | Status: DC | PRN
  Administered 2020-04-29: 50 mg via INTRAVENOUS

## 2020-04-29 MED ORDER — DEXAMETHASONE SODIUM PHOSPHATE 10 MG/ML IJ SOLN
INTRAMUSCULAR | Status: AC
  Filled 2020-04-29: qty 1

## 2020-04-29 MED ORDER — OXYCODONE HCL 5 MG PO TABS
ORAL_TABLET | ORAL | Status: AC
  Administered 2020-04-29: 5 mg via ORAL
  Filled 2020-04-29: qty 1

## 2020-04-29 MED ORDER — CHLORHEXIDINE GLUCONATE 0.12 % MT SOLN
OROMUCOSAL | Status: AC
  Administered 2020-04-29: 15 mL via OROMUCOSAL
  Filled 2020-04-29: qty 15

## 2020-04-29 MED ORDER — CEFAZOLIN SODIUM-DEXTROSE 2-4 GM/100ML-% IV SOLN
2.0000 g | INTRAVENOUS | Status: AC
  Administered 2020-04-29: 2 g via INTRAVENOUS

## 2020-04-29 MED ORDER — FENTANYL CITRATE (PF) 100 MCG/2ML IJ SOLN
INTRAMUSCULAR | Status: AC
  Administered 2020-04-29: 25 ug via INTRAVENOUS
  Filled 2020-04-29: qty 2

## 2020-04-29 MED ORDER — KETOROLAC TROMETHAMINE 30 MG/ML IJ SOLN
INTRAMUSCULAR | Status: AC
  Filled 2020-04-29: qty 1

## 2020-04-29 MED ORDER — ORAL CARE MOUTH RINSE: 15.0000 mL | Freq: Once | OROMUCOSAL | Status: AC

## 2020-04-29 MED ORDER — FENTANYL CITRATE (PF) 100 MCG/2ML IJ SOLN
INTRAMUSCULAR | Status: DC | PRN
  Administered 2020-04-29: 25 ug via INTRAVENOUS
  Administered 2020-04-29 (×2): 50 ug via INTRAVENOUS
  Administered 2020-04-29: 25 ug via INTRAVENOUS
  Administered 2020-04-29: 50 ug via INTRAVENOUS

## 2020-04-29 MED ORDER — LACTATED RINGERS IV SOLN: INTRAVENOUS | Status: DC

## 2020-04-29 MED ORDER — BUPIVACAINE-EPINEPHRINE (PF) 0.25% -1:200000 IJ SOLN
INTRAMUSCULAR | Status: DC | PRN
  Administered 2020-04-29: 18 mL

## 2020-04-29 MED ORDER — ROCURONIUM BROMIDE 100 MG/10ML IV SOLN
INTRAVENOUS | Status: DC | PRN
  Administered 2020-04-29: 30 mg via INTRAVENOUS
  Administered 2020-04-29: 50 mg via INTRAVENOUS

## 2020-04-29 MED ORDER — CELECOXIB 200 MG PO CAPS: 200.0000 mg | ORAL_CAPSULE | ORAL | Status: AC

## 2020-04-29 MED ORDER — HYDROCODONE-ACETAMINOPHEN 5-325 MG PO TABS: 1.0000 | ORAL_TABLET | Freq: Four times a day (QID) | ORAL | 0 refills | Status: DC | PRN

## 2020-04-29 MED ORDER — MIDAZOLAM HCL 2 MG/2ML IJ SOLN
INTRAMUSCULAR | Status: DC | PRN
  Administered 2020-04-29 (×2): 1 mg via INTRAVENOUS

## 2020-04-29 MED ORDER — ACETAMINOPHEN 500 MG PO TABS: 1000.0000 mg | ORAL_TABLET | ORAL | Status: AC

## 2020-04-29 MED ORDER — PROPOFOL 10 MG/ML IV BOLUS
INTRAVENOUS | Status: AC
  Filled 2020-04-29: qty 20

## 2020-04-29 MED ORDER — BUPIVACAINE-EPINEPHRINE (PF) 0.25% -1:200000 IJ SOLN
INTRAMUSCULAR | Status: AC
  Filled 2020-04-29: qty 30

## 2020-04-29 MED ORDER — PROPOFOL 10 MG/ML IV BOLUS
INTRAVENOUS | Status: DC | PRN
  Administered 2020-04-29: 150 mg via INTRAVENOUS

## 2020-04-29 MED ORDER — FENTANYL CITRATE (PF) 100 MCG/2ML IJ SOLN: 25.0000 ug | INTRAMUSCULAR | Status: DC | PRN

## 2020-04-29 MED ORDER — ACETAMINOPHEN 500 MG PO TABS
ORAL_TABLET | ORAL | Status: AC
  Administered 2020-04-29: 1000 mg via ORAL
  Filled 2020-04-29: qty 2

## 2020-04-29 MED ORDER — CHLORHEXIDINE GLUCONATE CLOTH 2 % EX PADS
6.0000 | MEDICATED_PAD | Freq: Once | CUTANEOUS | Status: AC
  Administered 2020-04-29: 6 via TOPICAL

## 2020-04-29 MED ORDER — MIDAZOLAM HCL 2 MG/2ML IJ SOLN
INTRAMUSCULAR | Status: AC
  Filled 2020-04-29: qty 2

## 2020-04-29 MED ORDER — CHLORHEXIDINE GLUCONATE CLOTH 2 % EX PADS: 6.0000 | MEDICATED_PAD | Freq: Once | CUTANEOUS | Status: DC

## 2020-04-29 MED ORDER — IBUPROFEN 800 MG PO TABS
800.0000 mg | ORAL_TABLET | Freq: Three times a day (TID) | ORAL | 0 refills | Status: DC | PRN
Start: 2020-04-29 — End: 2020-12-17

## 2020-04-29 MED ORDER — SUGAMMADEX SODIUM 200 MG/2ML IV SOLN
INTRAVENOUS | Status: DC | PRN
  Administered 2020-04-29: 200 mg via INTRAVENOUS

## 2020-04-29 MED ORDER — CELECOXIB 200 MG PO CAPS
ORAL_CAPSULE | ORAL | Status: AC
  Administered 2020-04-29: 200 mg via ORAL
  Filled 2020-04-29: qty 1

## 2020-04-29 MED ORDER — MEPERIDINE HCL 50 MG/ML IJ SOLN: 6.2500 mg | INTRAMUSCULAR | Status: DC | PRN

## 2020-04-29 MED ORDER — PROMETHAZINE HCL 25 MG/ML IJ SOLN: 6.2500 mg | INTRAMUSCULAR | Status: DC | PRN

## 2020-04-29 MED ORDER — KETOROLAC TROMETHAMINE 30 MG/ML IJ SOLN
INTRAMUSCULAR | Status: DC | PRN
  Administered 2020-04-29: 30 mg via INTRAVENOUS

## 2020-04-29 MED ORDER — ONDANSETRON HCL 4 MG/2ML IJ SOLN
INTRAMUSCULAR | Status: DC | PRN
  Administered 2020-04-29: 4 mg via INTRAVENOUS

## 2020-04-29 MED ORDER — ONDANSETRON HCL 4 MG/2ML IJ SOLN
INTRAMUSCULAR | Status: AC
  Filled 2020-04-29: qty 2

## 2020-04-29 MED ORDER — DEXAMETHASONE SODIUM PHOSPHATE 10 MG/ML IJ SOLN
INTRAMUSCULAR | Status: DC | PRN
  Administered 2020-04-29: 10 mg via INTRAVENOUS

## 2020-04-29 MED ORDER — GABAPENTIN 300 MG PO CAPS: 300.0000 mg | ORAL_CAPSULE | ORAL | Status: AC

## 2020-04-29 MED ORDER — CEFAZOLIN SODIUM-DEXTROSE 2-4 GM/100ML-% IV SOLN
INTRAVENOUS | Status: AC
  Filled 2020-04-29: qty 100

## 2020-04-29 MED ORDER — INDOCYANINE GREEN 25 MG IV SOLR
2.5000 mg | Freq: Once | INTRAVENOUS | Status: AC
  Administered 2020-04-29: 2.5 mg via INTRAVENOUS
  Filled 2020-04-29: qty 10

## 2020-04-29 MED ORDER — OXYCODONE HCL 5 MG/5ML PO SOLN: 5.0000 mg | Freq: Once | ORAL | Status: AC | PRN

## 2020-04-29 MED ORDER — ROCURONIUM BROMIDE 10 MG/ML (PF) SYRINGE
PREFILLED_SYRINGE | INTRAVENOUS | Status: AC
  Filled 2020-04-29: qty 10

## 2020-04-29 MED ORDER — FAMOTIDINE 20 MG PO TABS
20.0000 mg | ORAL_TABLET | Freq: Once | ORAL | Status: AC
  Administered 2020-04-29: 20 mg via ORAL

## 2020-04-29 SURGICAL SUPPLY — 60 items
BAG INFUSER PRESSURE 100CC (MISCELLANEOUS) IMPLANT
BLADE CLIPPER SURG (BLADE) ×3 IMPLANT
CANISTER SUCT 1200ML W/VALVE (MISCELLANEOUS) ×3 IMPLANT
CANNULA REDUC XI 12-8 STAPL (CANNULA) ×1
CANNULA REDUC XI 12-8MM STAPL (CANNULA) ×1
CANNULA REDUCER 12-8 DVNC XI (CANNULA) ×1 IMPLANT
CHLORAPREP W/TINT 26 (MISCELLANEOUS) ×3 IMPLANT
CLIP VESOLOCK MED LG 6/CT (CLIP) ×3 IMPLANT
COVER TIP SHEARS 8 DVNC (MISCELLANEOUS) ×1 IMPLANT
COVER TIP SHEARS 8MM DA VINCI (MISCELLANEOUS) ×2
COVER WAND RF STERILE (DRAPES) ×3 IMPLANT
DECANTER SPIKE VIAL GLASS SM (MISCELLANEOUS) ×3 IMPLANT
DEFOGGER SCOPE WARMER CLEARIFY (MISCELLANEOUS) ×3 IMPLANT
DERMABOND ADVANCED (GAUZE/BANDAGES/DRESSINGS) ×2
DERMABOND ADVANCED .7 DNX12 (GAUZE/BANDAGES/DRESSINGS) ×1 IMPLANT
DRAPE ARM DVNC X/XI (DISPOSABLE) ×4 IMPLANT
DRAPE COLUMN DVNC XI (DISPOSABLE) ×1 IMPLANT
DRAPE DA VINCI XI ARM (DISPOSABLE) ×8
DRAPE DA VINCI XI COLUMN (DISPOSABLE) ×2
DRAPE LAPAROTOMY 100X77 ABD (DRAPES) ×3 IMPLANT
ELECT CAUTERY BLADE 6.4 (BLADE) ×3 IMPLANT
ELECT REM PT RETURN 9FT ADLT (ELECTROSURGICAL) ×3
ELECTRODE REM PT RTRN 9FT ADLT (ELECTROSURGICAL) ×1 IMPLANT
GLOVE ORTHO TXT STRL SZ7.5 (GLOVE) ×6 IMPLANT
GOWN STRL REUS W/ TWL LRG LVL3 (GOWN DISPOSABLE) ×4 IMPLANT
GOWN STRL REUS W/TWL LRG LVL3 (GOWN DISPOSABLE) ×8
GRASPER SUT TROCAR 14GX15 (MISCELLANEOUS) IMPLANT
IRRIGATION STRYKERFLOW (MISCELLANEOUS) IMPLANT
IRRIGATOR STRYKERFLOW (MISCELLANEOUS)
IRRIGATOR SUCT 8 DISP DVNC XI (IRRIGATION / IRRIGATOR) IMPLANT
IRRIGATOR SUCTION 8MM XI DISP (IRRIGATION / IRRIGATOR)
IV NS IRRIG 3000ML ARTHROMATIC (IV SOLUTION) IMPLANT
KIT PINK PAD W/HEAD ARE REST (MISCELLANEOUS) ×3
KIT PINK PAD W/HEAD ARM REST (MISCELLANEOUS) ×1 IMPLANT
KIT TURNOVER KIT A (KITS) ×3 IMPLANT
LABEL OR SOLS (LABEL) ×3 IMPLANT
NEEDLE HYPO 22GX1.5 SAFETY (NEEDLE) ×3 IMPLANT
NEEDLE INSUFFLATION 14GA 120MM (NEEDLE) IMPLANT
NS IRRIG 500ML POUR BTL (IV SOLUTION) ×3 IMPLANT
PACK BASIN MINOR (MISCELLANEOUS) ×3 IMPLANT
PACK LAP CHOLECYSTECTOMY (MISCELLANEOUS) ×3 IMPLANT
POUCH SPECIMEN RETRIEVAL 10MM (ENDOMECHANICALS) ×3 IMPLANT
SEAL CANN UNIV 5-8 DVNC XI (MISCELLANEOUS) ×5 IMPLANT
SEAL XI 5MM-8MM UNIVERSAL (MISCELLANEOUS) ×10
SET TUBE SMOKE EVAC HIGH FLOW (TUBING) ×3 IMPLANT
SOLUTION ELECTROLUBE (MISCELLANEOUS) ×3 IMPLANT
SPONGE LAP 18X18 RF (DISPOSABLE) ×3 IMPLANT
STAPLER SKIN PROX 35W (STAPLE) IMPLANT
SUT ETHIBOND 0 MO6 C/R (SUTURE) ×3 IMPLANT
SUT MNCRL 4-0 (SUTURE) ×2
SUT MNCRL 4-0 27XMFL (SUTURE) ×1
SUT VIC AB 2-0 SH 27 (SUTURE)
SUT VIC AB 2-0 SH 27XBRD (SUTURE) IMPLANT
SUT VIC AB 3-0 SH 27 (SUTURE) ×2
SUT VIC AB 3-0 SH 27X BRD (SUTURE) ×1 IMPLANT
SUT VICRYL 0 AB UR-6 (SUTURE) ×3 IMPLANT
SUTURE MNCRL 4-0 27XMF (SUTURE) ×1 IMPLANT
SYR 20ML LL LF (SYRINGE) ×3 IMPLANT
SYR BULB IRRIG 60ML STRL (SYRINGE) ×3 IMPLANT
TROCAR Z-THREAD FIOS 11X100 BL (TROCAR) ×3 IMPLANT

## 2020-04-29 NOTE — Transfer of Care (Signed)
Immediate Anesthesia Transfer of Care Note  Patient: Meghan Reeves  Procedure(s) Performed: XI ROBOTIC ASSISTED LAPAROSCOPIC CHOLECYSTECTOMY (N/A ) HERNIA REPAIR VENTRAL ADULT, Open (N/A )  Patient Location: PACU  Anesthesia Type:General  Level of Consciousness: drowsy and patient cooperative  Airway & Oxygen Therapy: Patient Spontanous Breathing and Patient connected to face mask oxygen  Post-op Assessment: Report given to RN and Post -op Vital signs reviewed and stable  Post vital signs: Reviewed and stable  Last Vitals:  Vitals Value Taken Time  BP 131/78 04/29/20 1430  Temp    Pulse 87 04/29/20 1431  Resp 15 04/29/20 1431  SpO2 98 % 04/29/20 1431  Vitals shown include unvalidated device data.  Last Pain:  Vitals:   04/29/20 1427  TempSrc:   PainSc: (P) Asleep         Complications: No complications documented.

## 2020-04-29 NOTE — Discharge Instructions (Addendum)

## 2020-04-29 NOTE — Anesthesia Preprocedure Evaluation (Signed)
Anesthesia Evaluation  Patient identified by MRN, date of birth, ID band Patient awake    Reviewed: Allergy & Precautions, NPO status , Patient's Chart, lab work & pertinent test results  History of Anesthesia Complications Negative for: history of anesthetic complications  Airway Mallampati: III  TM Distance: >3 FB Neck ROM: Full    Dental no notable dental hx.    Pulmonary asthma (no recent inhaler use) , neg sleep apnea, former smoker,    breath sounds clear to auscultation- rhonchi (-) wheezing      Cardiovascular Exercise Tolerance: Good (-) hypertension(-) CAD, (-) Past MI, (-) Cardiac Stents and (-) CABG  Rhythm:Regular Rate:Normal - Systolic murmurs and - Diastolic murmurs    Neuro/Psych  Headaches, neg Seizures negative psych ROS   GI/Hepatic negative GI ROS, Neg liver ROS,   Endo/Other  negative endocrine ROSneg diabetes  Renal/GU negative Renal ROS     Musculoskeletal negative musculoskeletal ROS (+)   Abdominal (+) + obese,   Peds  Hematology negative hematology ROS (+)   Anesthesia Other Findings Past Medical History: No date: Asthma, mild No date: Medical history non-contributory   Reproductive/Obstetrics                             Anesthesia Physical Anesthesia Plan  ASA: II  Anesthesia Plan: General   Post-op Pain Management:    Induction: Intravenous  PONV Risk Score and Plan: 2 and Ondansetron, Dexamethasone and Midazolam  Airway Management Planned: Oral ETT  Additional Equipment:   Intra-op Plan:   Post-operative Plan: Extubation in OR  Informed Consent: I have reviewed the patients History and Physical, chart, labs and discussed the procedure including the risks, benefits and alternatives for the proposed anesthesia with the patient or authorized representative who has indicated his/her understanding and acceptance.     Dental advisory given  Plan  Discussed with: CRNA and Anesthesiologist  Anesthesia Plan Comments:         Anesthesia Quick Evaluation

## 2020-04-29 NOTE — Interval H&P Note (Signed)
History and Physical Interval Note:  04/29/2020 11:43 AM  Meghan Reeves  has presented today for surgery, with the diagnosis of Cholelithiasis, ventra hernai.  The various methods of treatment have been discussed with the patient and family. After consideration of risks, benefits and other options for treatment, the patient has consented to  Procedure(s): XI ROBOTIC ASSISTED LAPAROSCOPIC CHOLECYSTECTOMY (N/A) [place], Open (N/A) as a surgical intervention.  The patient's history has been reviewed, patient examined, no change in status, stable for surgery.  I have reviewed the patient's chart and labs.  Questions were answered to the patient's satisfaction.     Ronny Bacon, M.D., [facility]  04/29/2020 ; 11:44 AM

## 2020-04-29 NOTE — Op Note (Signed)
Robotic cholecystectomy  Pre-operative Diagnosis: Chronic calculus cholecystitis Epigastric incisional hernia.  Post-operative Diagnosis:  Same.  Procedure: Robotic assisted laparoscopic cholecystectomy.  Open repair of incisional hernia,   Surgeon: Ronny Bacon, M.D., FACS  Anesthesia: General. with endotracheal tube  Findings: 1 cm fascial defect.,  Multiple small stones.  Estimated Blood Loss: 15 mL         Drains: None         Specimens: Gallbladder           Complications: none  Procedure Details  The patient was seen again in the Holding Room.  2.5 mg dose of ICG was administered intravenously.  The benefits, complications, treatment options, risks and expected outcomes were discussed with the patient. The likelihood of improving the patient's symptoms with return to their baseline status is good.  The patient and/or family concurred with the proposed plan, giving informed consent, again alternatives reviewed.  The patient was taken to Operating Room, identified, and the procedure verified as robotic assisted laparoscopic cholecystectomy. Prior to the induction of general anesthesia, antibiotic prophylaxis was administered. VTE prophylaxis was in place. General endotracheal anesthesia was then administered and tolerated well. The patient was positioned in the supine position.  After the induction, the abdomen was prepped with Chloraprep and draped in the sterile fashion.  A Time Out was held and the above information confirmed. After local infiltration of quarter percent Marcaine with epinephrine, stab incision was made left upper quadrant.  Just below the costal margin approximately midclavicular line the Veres needle is passed with sensation of the layers to penetrate the abdominal wall and into the peritoneum.  Saline drop test is confirmed peritoneal placement.  Insufflation is initiated with carbon dioxide to pressures of 15 mmHg. Right infra-umbilical local infiltration  with quarter percent Marcaine with epinephrine is utilized.  Made a 12 mm incision on the supraumbilical bulge, the hernia sac was distended with pneumoperitoneum and there was some incarcerated omentum within it scarred.  I opened the hernia sac, excised the omentum and reduced the residual omentum into the peritoneal cavity.  I advanced an robotic 12 mm port under direct visualization into the peritoneal cavity.  Once the peritoneum was penetrated, insufflation was transferred.  The trocar was then advanced into the abdominal cavity under direct visualization. Pneumoperitoneum was then continued with CO2 at 14 mmHg or less and tolerated well without any adverse changes in the patient's vital signs.  Two 8.5-mm ports were placed in the left lower quadrant and laterally, and one to the right lower quadrant, all under direct vision. All skin incisions  were infiltrated with a local anesthetic agent before making the incision and placing the trocars.  The patient was positioned  in reverse Trendelenburg, tilted the patient's left side down.  Da Vinci XI robot was then positioned on to the patient's left side, and docked. The gallbladder was identified, the fundus grasped via the arm 4 Prograsp and retracted cephalad. Adhesions were lysed with scissors and cautery.  The infundibulum was identified grasped and retracted laterally, exposing the peritoneum overlying the triangle of [place]. This was then opened and dissected using cautery & scissors. An extended critical view of the cystic duct and cystic artery was obtained, aided by the ICG via FireFly which enabled ready visualization of the ductal anatomy.    The cystic duct was clearly identified and dissected to isolation.   Artery well isolated and clipped, and the cystic duct was triple clipped and divided with scissors, leaving two on  the remaining stump.  A single clip was applied to the cystic artery, the gallbladder aspect was cauterized with bipolar, and  sharply divided with monopolar scissors. The gallbladder was taken from the gallbladder fossa in a retrograde fashion with the electrocautery. The gallbladder was removed and placed in an Endocatch bag. The robot was undocked and moved away from the operative field. The gallbladder and Endocatch sac were then removed through the supra umbilical port site.   Inspection of the right upper quadrant was performed. No bleeding, bile duct injury or leak, or bowel injury was noted. The supra-umbilical fascial defect was closed with interrumpted 0 Vicryl sutures under direct visualization. Pneumoperitoneum was released and ports removed.  4-0 subcuticular Monocryl was used to close the skin. Dermabond was  applied.  The patient was then extubated and brought to the recovery room in stable condition. Sponge, lap, and needle counts were correct at closure and at the conclusion of the case.               Ronny Bacon, M.D., [facility] 04/29/2020 2:25 PM

## 2020-04-29 NOTE — Progress Notes (Signed)
Pt c/o some lower mid sternal pain. Pt states pain diminishes when pressure is applied. Dr. Rosey Bath notified. Acknowledged. Orders received.

## 2020-04-29 NOTE — Anesthesia Procedure Notes (Signed)
Procedure Name: Intubation Date/Time: 04/29/2020 12:32 PM Performed by: Jonna Clark, CRNA Pre-anesthesia Checklist: Patient identified, Patient being monitored, Timeout performed, Emergency Drugs available and Suction available Patient Re-evaluated:Patient Re-evaluated prior to induction Oxygen Delivery Method: Circle system utilized Preoxygenation: Pre-oxygenation with 100% oxygen Induction Type: IV induction Ventilation: Mask ventilation without difficulty Laryngoscope Size: Mac and 3 Grade View: Grade I Tube type: Oral Tube size: 7.0 mm Number of attempts: 1 Airway Equipment and Method: Stylet Placement Confirmation: ETT inserted through vocal cords under direct vision,  positive ETCO2 and breath sounds checked- equal and bilateral Secured at: 21 cm Tube secured with: Tape Dental Injury: Teeth and Oropharynx as per pre-operative assessment

## 2020-04-30 ENCOUNTER — Encounter: Payer: Self-pay | Admitting: Surgery

## 2020-04-30 NOTE — Anesthesia Postprocedure Evaluation (Signed)
Anesthesia Post Note  Patient: Meghan Reeves  Procedure(s) Performed: XI ROBOTIC ASSISTED LAPAROSCOPIC CHOLECYSTECTOMY (N/A ) HERNIA REPAIR VENTRAL ADULT, Open (N/A )  Patient location during evaluation: PACU Anesthesia Type: General Level of consciousness: awake and alert and oriented Pain management: pain level controlled Vital Signs Assessment: post-procedure vital signs reviewed and stable Respiratory status: spontaneous breathing, nonlabored ventilation and respiratory function stable Cardiovascular status: blood pressure returned to baseline and stable Postop Assessment: no signs of nausea or vomiting Anesthetic complications: no   No complications documented.   Last Vitals:  Vitals:   04/29/20 1635 04/29/20 1701  BP: 109/70 113/75  Pulse: 84 96  Resp: 18 18  Temp: 36.7 C   SpO2: 98% 100%    Last Pain:  Vitals:   04/29/20 1701  TempSrc:   PainSc: 4                  Trellis Vanoverbeke

## 2020-05-01 LAB — SURGICAL PATHOLOGY

## 2020-05-14 ENCOUNTER — Other Ambulatory Visit: Payer: Self-pay

## 2020-05-14 ENCOUNTER — Encounter: Payer: Self-pay | Admitting: Surgery

## 2020-05-14 ENCOUNTER — Ambulatory Visit (INDEPENDENT_AMBULATORY_CARE_PROVIDER_SITE_OTHER): Payer: Self-pay | Admitting: Surgery

## 2020-05-14 VITALS — BP 125/78 | HR 88 | Temp 97.9°F | Wt 193.0 lb

## 2020-05-14 DIAGNOSIS — Z9049 Acquired absence of other specified parts of digestive tract: Secondary | ICD-10-CM | POA: Insufficient documentation

## 2020-05-14 NOTE — Patient Instructions (Addendum)
Dieta rica en fibra  High-Fiber Diet  La fibra, tambin llamada fibra dietaria, es un tipo de carbohidrato que se encuentra en las frutas, las verduras, los cereales integrales y los frijoles. Una dieta rica en fibra puede tener muchos beneficios para la salud. El mdico puede recomendar una dieta rica en fibra para ayudar a:   Evitar el estreimiento. La fibra puede hacer que defeque con ms frecuencia.   Disminuir el nivel de colesterol.   Aliviar las siguientes afecciones:  ? Hinchazn de la venas en el ano (hemorroides).  ? Hinchazn e irritacin (inflamacin) de zonas especficas del tracto digestivo (diverticulitis sin complicaciones).  ? Un problema del intestino grueso (colon) que, a veces, causa dolor y diarrea (sndrome de colon irritable, SCI).   Evitar comer en exceso como parte de un plan para bajar de peso.   Evitar la enfermedad cardaca, la diabetes tipo 2 y ciertos cnceres.  En qu consiste el plan?  El consumo diario recomendado de fibra en gramos (g) incluye lo siguiente:   38g para hombres de 50 aos o menos.   30g para los hombres mayores de 50 aos.   25g para mujeres de 50 aos o menos.   21g para las mujeres mayores de 50 aos.  Puede recibir la ingesta diaria recomendada de fibra dietaria de la siguiente manera:   Coma una variedad de frutas, verduras, granos y frijoles.   Tome un suplemento de fibra, si no es posible comer suficiente fibra en su dieta.  Qu debo saber acerca de la dieta rica en fibra?   Es mejor obtener fibra directamente de los alimentos en lugar de recibirla de suplementos de fibra. No hay demasiada investigacin acerca de qu tan efectivos son los suplementos.   Verifique siempre el contenido de fibra en la etiqueta de informacin nutricional de los alimentos preenvasados. Busque alimentos que contengan 5g o ms de fibra por porcin.   Hable con un especialista en alimentacin y nutricin (nutricionista) si tiene preguntas sobre alimentos  especficos que se recomiendan o no para su afeccin mdica, especialmente si aquellos alimentos no estn detallados a continuacin.   Aumente gradualmente la cantidad de fibra que consume. Si aumenta demasiado rpido el consumo de fibra dietaria puede provocar distensin abdominal, clicos o gases.   Beba abundante agua. El agua ayuda a digerir la fibra.  Cules son algunos consejos para seguir este plan?   Consuma una gran variedad de alimentos ricos en fibra.   Asegrese de que al menos la mitad de los granos que ingiere cada da sean cereales integrales.   Coma panes y cereales hechos con harina de cereales integrales en lugar de harina refinada o blanca.   Coma arroz integral, trigo burgol o mijo en lugar de arroz blanco.   Comience el da con un desayuno rico en fibra, como un cereal que contenga al menos 5g de fibra o ms por porcin.   Use frijoles en lugar de carne en las sopas, ensaladas o platos de pastas.   Coma colaciones ricas en fibra, como frutos rojos, verduras crudas, frutos secos y palomitas de maz.   Elija frutas y verduras enteras en lugar de formas procesadas como jugo o salsa.  Qu alimentos puedo comer?    Frutas  Frutos rojos. Peras. Manzanas. Naranjas. [place]. Ciruelas y pasas. Higos secos.  Verduras  Batatas. [place]. Col rizada. [place]as. Repollo. Brcoli. Coliflor. Guisantes. [place]orias. [place].  Cereales  Panes integrales. Cereal multigrano. Avena. Arroz integral. Cebada. Trigo burgol. Mijo. Quinua. [place]   [place]. Es posible que los productos mencionados arriba no formen una lista completa de las bebidas y los alimentos recomendados.  Comunquese con un nutricionista para conocer ms opciones. Qu alimentos no se recomiendan? Frutas Jugo de frutas. Frutas cocidas coladas. Verduras Papas fritas. Verduras enlatadas. Verduras muy cocidas. Cereales Pan blanco. Pastas hechas con Letitia Neri. Arroz Kerby Moors y otras protenas Cortes de carne con grasa. Pollo o pescado fritos. Lcteos Leche. Yogur. Queso crema. [employer]. Grasas y [employer]. Bebidas Gaseosas. Otros alimentos Tortas y pasteles. Es posible que los productos que se enumeran ms arriba no sean una lista completa de los alimentos y las bebidas que se Higher education careers adviser. Comunquese con un nutricionista para obtener ms informacin. Resumen  La fibra es un tipo de carbohidrato. Se encuentra en las frutas, las verduras, los cereales integrales y los frijoles.  Una dieta rica en fibra representa muchos beneficios para la salud, como evitar el estreimiento, Management consultant colesterol en la [place], ayudar a perder peso y reducir el riesgo de enfermedad cardaca, diabetes y ciertos tipos de cncer.  Aumente gradualmente la ingesta de fibra. El aumento demasiado rpido puede provocar clicos, distensin abdominal y gases. Beba mucha agua a la vez que [employer].  Las mejores fuentes de fibra incluyen frutas y verduras enteras, granos integrales, frutos secos, semillas y frijoles. Esta informacin no tiene Marine scientist el consejo del mdico. Asegrese de hacerle al mdico cualquier pregunta que tenga. Document Revised: 02/05/2018 Document Reviewed: 10/15/2017 Elsevier Patient Education  [place].

## 2020-05-14 NOTE — Progress Notes (Signed)
[facility] POST-OP OFFICE VISIT  05/14/2020  HPI: Meghan Reeves is a 39 y.o. female 2 weeks s/p robotic cholecystectomy, with repair of small incisional hernia. We had an interpreter over the phone, and she had bowel movements once daily currently twice a day she had noted a little bit of blood and mucus with her bowel movements after resolution of her constipation.  She reports some degree of heartburn but denies nausea, vomiting, chills, fever.  Pain is mostly located at her lower abdomen which improves with her bowel movement.  She does complain of not feeling like she completely empties her rectum with her bowel movements. Vital signs: BP 125/78   Pulse 88   Temp 97.9 F (36.6 C) (Temporal)   Wt 193 lb (87.5 kg)   SpO2 98%   BMI 33.13 kg/m    Physical Exam: Constitutional: She appears well, nontoxic. Abdomen: Soft and nontender, well rounded. Skin: Incisions are healing nicely.  No ecchymosis, no erythema, no induration.  No masses.  Assessment/Plan: This is a 39 y.o. female 2 weeks s/p robotic cholecystectomy repair of small incisional hernia.  Patient Active Problem List   Diagnosis Date Noted  . Incisional hernia, without obstruction or gangrene 04/25/2020  . 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 Diastasis recti 04/25/2020  . Nexplanon in place 12/14/2019  . Migraine without aura and with status migrainosus, not intractable 12/08/2018  . Medication overuse headache 12/08/2018  . Tobacco use disorder, continuous 02/03/2017  . Encounter for smoking cessation counseling 02/03/2017  . Spanish speaking patient 10/01/2016    -Advised addition of fiber and fluids, adding MiraLAX laxative to achieve goals of twice daily bowel movements that are not difficult to move and are not precipitated by pain.  Should this scenario persist with pain, blood and mucousy bowel movements, we should get her to see a gastroenterologist for possible lower  endoscopy.  But otherwise I think she is aware that we expect this to resolve, as transitions and constipation post surgery is not uncommon.  We will be glad to see her back as needed.   Ronny Bacon M.D., FACS 05/14/2020, 11:12 AM

## 2020-05-21 ENCOUNTER — Encounter (HOSPITAL_COMMUNITY): Payer: Self-pay | Admitting: Emergency Medicine

## 2020-05-21 ENCOUNTER — Other Ambulatory Visit: Payer: Self-pay

## 2020-05-21 ENCOUNTER — Ambulatory Visit (HOSPITAL_COMMUNITY)
Admission: EM | Admit: 2020-05-21 | Discharge: 2020-05-21 | Disposition: A | Discharge status: Home / Self Care | Payer: Self-pay

## 2020-05-21 ENCOUNTER — Ambulatory Visit (HOSPITAL_COMMUNITY)
Admission: EM | Admit: 2020-05-21 | Discharge: 2020-05-21 | Disposition: A | Discharge status: Home / Self Care | Payer: Self-pay | Attending: Emergency Medicine | Admitting: Emergency Medicine

## 2020-05-21 DIAGNOSIS — L249 Irritant contact dermatitis, unspecified cause: Secondary | ICD-10-CM

## 2020-05-21 MED ORDER — TRIAMCINOLONE ACETONIDE 0.1 % EX CREA
1.0000 | TOPICAL_CREAM | Freq: Two times a day (BID) | CUTANEOUS | 0 refills | Status: DC
Start: 2020-05-21 — End: 2020-06-11

## 2020-05-21 MED ORDER — PREDNISONE 10 MG PO TABS
ORAL_TABLET | ORAL | 0 refills | Status: DC
Start: 2020-05-21 — End: 2020-06-11

## 2020-05-21 NOTE — ED Notes (Signed)
Pt access rep, Jenny Reichmann, stated pt did not answer when called for registration x3 attempts.

## 2020-05-21 NOTE — Discharge Instructions (Addendum)
[place] prednisone en el proximo 6 dias Triamcinolone 2 veces cada dia  Puede [place] cetirizine/zyrtec en la manana benadryl en la noche- puede obtener en la pharmacia sin receta Regrese si no mejoran o empeoran

## 2020-05-21 NOTE — ED Triage Notes (Signed)
Per pacific interpreter pt son came into contact with poison ivy and she then contracted it from changing his clothing.   Pt son was treated with cream, pt also used cream but therapy is not successful for her.

## 2020-05-23 NOTE — ED Provider Notes (Signed)
[name]    CSN: [id number] Arrival date & time: 05/21/20  1931      History   Chief Complaint Chief Complaint  Patient presents with  . Poison Ivy    HPI Meghan Reeves is a 39 y.o. female today duration of rash.  Patient reports over the past 3 days she has developed a rash to her arms and ankles as well as her smell.  She is concerned about this being poison ivy.  Reports that she had a recent exposure.  Has had associated itching.  She has been using Benadryl without relief.  Denies any difficulty breathing or shortness of breath.  Denies any lesions on face and mouth.  HPI  Past Medical History:  Diagnosis Date  . Asthma, mild   . Medical history non-contributory     Patient Active Problem List   Diagnosis Date Noted  . Status post laparoscopic cholecystectomy 05/14/2020  . Diastasis recti 04/25/2020  . Nexplanon in place 12/14/2019  . Migraine without aura and with status migrainosus, not intractable 12/08/2018  . Medication overuse headache 12/08/2018  . Tobacco use disorder, continuous 02/03/2017  . Encounter for smoking cessation counseling 02/03/2017  . Spanish speaking patient 10/01/2016    Past Surgical History:  Procedure Laterality Date  . APPENDECTOMY  09/2016  . CESAREAN SECTION N/A 09/07/2017   Procedure: CESAREAN SECTION;  Surgeon: Mora Bellman, MD;  Location: [facility];  Service: Obstetrics;  Laterality: N/A;  . LAPAROSCOPIC APPENDECTOMY N/A 09/29/2016   Procedure: APPENDECTOMY LAPAROSCOPIC;  Surgeon: Greer Pickerel, MD;  Location: [facility];  Service: General;  Laterality: N/A;  . NO PAST SURGERIES    . VENTRAL HERNIA REPAIR N/A 04/29/2020   Procedure: HERNIA REPAIR VENTRAL ADULT, Open;  Surgeon: Ronny Bacon, MD;  Location: [facility];  Service: General;  Laterality: N/A;    OB History    Gravida  2   Para  1   Term  1   Preterm  0   AB  1   Living  1     SAB  1   TAB  0   Ectopic  0    Multiple      Live Births  1            Home Medications    Prior to Admission medications   Medication Sig Start Date End Date Taking? Authorizing Provider  amitriptyline (ELAVIL) 10 MG tablet Take 1 tablet (10 mg total) by mouth at bedtime. 12/13/19   Lomax, Amy, NP  HYDROcodone-acetaminophen (NORCO/VICODIN) 5-325 MG tablet Take 1 tablet by mouth every 6 (six) hours as needed for moderate pain. 04/29/20   Ronny Bacon, MD  ibuprofen (ADVIL) 800 MG tablet Take 1 tablet (800 mg total) by mouth every 8 (eight) hours as needed. 04/29/20   Ronny Bacon, MD  metoCLOPramide (REGLAN) 10 MG tablet TAKE 1 TABLET(10 MG) BY MOUTH THREE TIMES DAILY AS NEEDED FOR NAUSEA OR HEADACHE 12/14/19   Lomax, Amy, NP  ondansetron (ZOFRAN) 4 MG tablet Take 1 tablet (4 mg total) by mouth every 8 (eight) hours as needed for nausea or vomiting. 04/07/20   Kinnie Feil, PA-C  predniSONE (DELTASONE) 10 MG tablet Begin with 6 tabs on day 1, 5 tab on day 2, 4 tab on day 3, 3 tab on day 4, 2 tab on day 5, 1 tab on day 6-take with food 05/21/20   Kellsie Grindle C, PA-C  rizatriptan (MAXALT) 10 MG tablet Take 1 tablet (  10 mg total) by mouth as needed for migraine. May repeat in 2 hours if needed 06/07/19   Melvenia Beam, MD  triamcinolone cream (KENALOG) 0.1 % Apply 1 application topically 2 (two) times daily. 05/21/20   Leonette Tischer, Elesa Hacker, PA-C    Family History Family History  Problem Relation Age of Onset  . Asthma Mother   . Migraines Sister     Social History Social History   Tobacco Use  . Smoking status: Former Smoker    Packs/day: 0.25    Years: 10.00    Pack years: 2.50    Types: Cigarettes  . Smokeless tobacco: Never Used  Vaping Use  . Vaping Use: Never used  Substance Use Topics  . Alcohol use: No    Alcohol/week: 0.0 standard drinks  . Drug use: No     Allergies   Topamax [topiramate]   Review of Systems Review of Systems  Constitutional: Negative for fatigue and fever.    HENT: Negative for mouth sores.   Eyes: Negative for visual disturbance.  Respiratory: Negative for shortness of breath.   Cardiovascular: Negative for chest pain.  Gastrointestinal: Negative for abdominal pain, nausea and vomiting.  Genitourinary: Negative for genital sores.  Musculoskeletal: Negative for arthralgias and joint swelling.  Skin: Positive for color change and rash. Negative for wound.  Neurological: Negative for dizziness, weakness, light-headedness and headaches.     Physical Exam Triage Vital Signs ED Triage Vitals  Enc Vitals Group     BP 05/21/20 2008 113/75     Pulse Rate 05/21/20 2008 86     Resp 05/21/20 2008 16     Temp 05/21/20 2008 98.4 F (36.9 C)     Temp Source 05/21/20 2008 Oral     SpO2 05/21/20 2008 98 %     Weight --      Height --      Head Circumference --      Peak Flow --      Pain Score 05/21/20 2013 8     Pain Loc --      Pain Edu? --      Excl. in [place]? --    No data found.  Updated Vital Signs BP 113/75 (BP Location: Left Arm)   Pulse 86   Temp 98.4 F (36.9 C) (Oral)   Resp 16   LMP 05/19/2020 (Exact Date)   SpO2 98%   Visual Acuity Right Eye Distance:   Left Eye Distance:   Bilateral Distance:    Right Eye Near:   Left Eye Near:    Bilateral Near:     Physical Exam Vitals and nursing note reviewed.  Constitutional:      Appearance: She is well-developed.     Comments: No acute distress  HENT:     Head: Normocephalic and atraumatic.     Nose: Nose normal.  Eyes:     Conjunctiva/sclera: Conjunctivae normal.  Cardiovascular:     Rate and Rhythm: Normal rate.  Pulmonary:     Effort: Pulmonary effort is normal. No respiratory distress.     Comments: Breathing comfortably at rest, CTABL, no wheezing, rales or other adventitious sounds auscultated  Abdominal:     General: There is no distension.  Musculoskeletal:        General: Normal range of motion.     Cervical back: Neck supple.  Skin:    General: Skin  is warm and dry.     Findings: Rash present.     Comments:  Upper extremities and lower extremities with circular erythematous raised areas with, signs of excoriation similar lesion noted within scalp, no lesions noted on face  Neurological:     Mental Status: She is alert and oriented to person, place, and time.      UC Treatments / Results  Labs (all labs ordered are listed, but only abnormal results are displayed) Labs Reviewed - No data to display  EKG   Radiology No results found.  Procedures Procedures (including critical care time)  Medications Ordered in UC Medications - No data to display  Initial Impression / Assessment and Plan / UC Course  I have reviewed the triage vital signs and the nursing notes.  Pertinent labs & imaging results that were available during my care of the patient were reviewed by me and considered in my medical decision making (see chart for details).     Rash likely contact dermatitis, treating with prednisone and triamcinolone topically, continue antihistamines.  Discussed that further symptomatic and supportive care with over-the-counter medicine.Discussed strict return precautions. Patient verbalized understanding and is agreeable with plan.  Final Clinical Impressions(s) / UC Diagnoses   Final diagnoses:  Irritant contact dermatitis, unspecified trigger     Discharge Instructions     [place] prednisone en el proximo 6 dias Triamcinolone 2 veces cada dia  Puede [place] cetirizine/zyrtec en la manana benadryl en la noche- puede obtener en la pharmacia sin receta Regrese si no mejoran o empeoran   ED Prescriptions    Medication Sig Dispense Auth. Provider   predniSONE (DELTASONE) 10 MG tablet Begin with 6 tabs on day 1, 5 tab on day 2, 4 tab on day 3, 3 tab on day 4, 2 tab on day 5, 1 tab on day 6-take with food 21 tablet Asha Grumbine C, PA-C   triamcinolone cream (KENALOG) 0.1 % Apply 1 application topically 2 (two) times daily. 30 g  Nana Hoselton, Bryant C, PA-C     PDMP not reviewed this encounter.   Janith Lima, PA-C 05/23/20 1119

## 2020-06-11 ENCOUNTER — Ambulatory Visit (INDEPENDENT_AMBULATORY_CARE_PROVIDER_SITE_OTHER): Payer: Self-pay | Admitting: Family Medicine

## 2020-06-11 ENCOUNTER — Encounter: Payer: Self-pay | Admitting: Family Medicine

## 2020-06-11 VITALS — BP 124/76 | HR 80 | Ht 61.0 in | Wt 195.0 lb

## 2020-06-11 DIAGNOSIS — G43001 Migraine without aura, not intractable, with status migrainosus: Secondary | ICD-10-CM

## 2020-06-11 NOTE — Progress Notes (Addendum)
PATIENT: Meghan Reeves DOB: 1981-01-05  REASON FOR VISIT: follow up HISTORY FROM: patient  Chief Complaint  Patient presents with   Follow-up    Rm 1 here for a f/u on migraines. Pt says she notices the last 3 weeks migranes are better but she has the foggy memory and head heavy. pt says the left eye is red     HISTORY OF PRESENT ILLNESS: Today 06/11/20 Meghan Reeves is a 39 y.o. Reeves here today for follow up. She has continues amitriptyline 10mg  at bedtime. She feels that her headaches are significantly better. She does not have regular headaches any longer. She does also note that she has intermittent "heaviness" of her head and feels foggy at times. Otherwise, she is doing well. Rizatriptan works well to abort migraines. She has noted intermittent red eye as well. Eye is not painful but is watery. She has not seen PCP. No vision changes.    HISTORY: (copied from my note on 12/13/2019)  Meghan Reeves is a 39 y.o. Reeves here today for follow up for migraines. She is doing well on Ajovy. She is having about 3-4 migraines per month. Unfortunately, she is uninsured and is unable to continue Ajovy. She has been receiving samples for the past year. She has one injection left. We have tried to help her with [place]'s assistance program as well as patient assistance with Ajovy. She is not a [place] citizen and does not qualify for assistance programs. She is asking to start another medication that is more affordable. She has tried topiramate in the past that caused worsening anxiety.   [place], [place] interpreter, assists with visit today   HISTORY: (copied from my note on 07/10/2019)  Meghan Reeves today for follow up for migraines.She is here with an interpreter who aids in obtaining history. Shehas continuedAjovyinjections monthly. She does feel that these injections have helped some with reducing the  intensity of her headaches. She was given cyclobenzaprine at last visit.She does feel that this helps with muscle tension. She continues to have daily headaches. She reports pressure across her forehead. She has a throbbing sensation in the back of her head.She does note runny nose, sore throat and body aches when she has worsening headaches. She has not been treated for seasonal allergies in the past. She drinks 2 bottles of water daily.She does take ibuprofen at least 3-4 times weekly. She has taken rizatriptan once but is not sure it helped.  HISTORY: (copied fromDr Ahern'snote on 06/07/2019)  Interval history 06/07/2019:The headaches are better.Here with interpreter.Not as strong a little better. She takes Tylenol for back pain. She stopped the Fioricet. She has 4-5 headache days a month. Will help with [facility] [employer] assistance. Will give samples of Ajovy for 6 months until then and try to get it approved through [place] assistance as well. She has tension in the back of the head, tightness.  Meghan [name] Reeves a 63 Reeves as requested by Emergency Room for headaches. She reports that she has headaches everyday. Headaches started several months ago. Milder headaches are described as a pressure sensation across the top of her head. Pressure is throbbing. Rated 5-6/10. She is nauseated at times with mild headaches. She has more severe headaches about 1-2 times a week. She describes these headaches as more unilateral (side varies). She feels a pounding sensation. Rated 7-8/10. These headaches are associated with sound and light sensitivity. She has a vision disturbance described as  a white veil that she can see on the same side she is having headache. She is nauseated with headaches but does not vomit. She denies dizziness or syncope with headaches. She feels that stress is a trigger. She has not noticed any alleviating factors with exception of rest.  She has noticed that headaches last all day if not treated. She is taking Fioricet twice daily most days (occasionally once daily.) She was given prescription for 90 tablets of Fioricet on 12/11 and has about 7-8 tablets left. She was given Topamax at one UC visit that caused numbness of her lip and she saw flashy lights. She took this for about a week then stopped. Here with interpreter. No other focal neurologic deficits, associated symptoms, inciting events or modifiable factors.  She does have trouble going to sleep. She may fall asleep around 2-3am and gets up around 9am. She does not snore. She rarely wakes up with a headache. She denies dry mouth in the mornings.   She is taking Lexapro daily for anxiety. She feels that this medication is helping some but she feels sleepy during the day.   She had a normal CT of head in 09/2018. She has never had an MRI. She does not drink or smoke. She has a sister with migraines that is treated with Advil.   Reviewed notes, labs and imaging from outside physicians, which showed: Patient was referred from the emergency room. She was seen at the emergency room in November 2019. She is also seen Molli Barrows as well in the office earlier this month. She reported having lots of headaches for the last couple months. She had 3 ER visits into urgent care visits for the same. She was taking Advil migraine on a daily basis. She was tried on Topamax for prevention 100 mg at bedtime and she had side effects including anxiety.  Reviewed office notes from Dr. Kenton Kingfisher. Patient was seen in the emergency room multiple times. She is been taking a combination of pseudoephedrine and antihistamine and feels headache improved. She reports headaches occur almost daily. Localized to the left side of the head occasionally is like a tension sensation across the bilateral head. Try Topamax did not feel the medication work and discontinued it. Endorsed nausea and light  sensitivity when headaches occur. She was given Fioricet 90 capsules.   REVIEW OF SYSTEMS: Out of a complete 14 system review of symptoms, the patient complains only of the following symptoms, headaches, red, watery eye and all other reviewed systems are negative.  ALLERGIES: Allergies  Allergen Reactions   Topamax [Topiramate] Other (See Comments)    Blurry vision, numbness of lips    HOME MEDICATIONS: Outpatient Medications Prior to Visit  Medication Sig Dispense Refill   amitriptyline (ELAVIL) 10 MG tablet Take 1 tablet (10 mg total) by mouth at bedtime. 30 tablet 11   ibuprofen (ADVIL) 800 MG tablet Take 1 tablet (800 mg total) by mouth every 8 (eight) hours as needed. 30 tablet 0   metoCLOPramide (REGLAN) 10 MG tablet TAKE 1 TABLET(10 MG) BY MOUTH THREE TIMES DAILY AS NEEDED FOR NAUSEA OR HEADACHE 15 tablet 1   ondansetron (ZOFRAN) 4 MG tablet Take 1 tablet (4 mg total) by mouth every 8 (eight) hours as needed for nausea or vomiting. 4 tablet 0   rizatriptan (MAXALT) 10 MG tablet Take 1 tablet (10 mg total) by mouth as needed for migraine. May repeat in 2 hours if needed 10 tablet 11   HYDROcodone-acetaminophen (NORCO/VICODIN) 5-325  MG tablet Take 1 tablet by mouth every 6 (six) hours as needed for moderate pain. 15 tablet 0   predniSONE (DELTASONE) 10 MG tablet Begin with 6 tabs on day 1, 5 tab on day 2, 4 tab on day 3, 3 tab on day 4, 2 tab on day 5, 1 tab on day 6-take with food 21 tablet 0   triamcinolone cream (KENALOG) 0.1 % Apply 1 application topically 2 (two) times daily. 30 g 0   No facility-administered medications prior to visit.    PAST MEDICAL HISTORY: Past Medical History:  Diagnosis Date   Asthma, mild    Medical history non-contributory     PAST SURGICAL HISTORY: Past Surgical History:  Procedure Laterality Date   APPENDECTOMY  09/2016   CESAREAN SECTION N/A 09/07/2017   Procedure: CESAREAN SECTION;  Surgeon: Mora Bellman, MD;  Location:  [facility];  Service: Obstetrics;  Laterality: N/A;   LAPAROSCOPIC APPENDECTOMY N/A 09/29/2016   Procedure: APPENDECTOMY LAPAROSCOPIC;  Surgeon: Greer Pickerel, MD;  Location: [facility];  Service: General;  Laterality: N/A;   NO PAST SURGERIES     VENTRAL HERNIA REPAIR N/A 04/29/2020   Procedure: HERNIA REPAIR VENTRAL ADULT, Open;  Surgeon: Ronny Bacon, MD;  Location: [facility];  Service: General;  Laterality: N/A;    FAMILY HISTORY: Family History  Problem Relation Age of Onset   Asthma Mother    Migraines Sister     SOCIAL HISTORY: Social History   Socioeconomic History   Marital status: Significant Other    Spouse name: Not on file   Number of children: 1   Years of education: Not on file   Highest education level: 6th grade  Occupational History   Not on file  Tobacco Use   Smoking status: Former Smoker    Packs/day: 0.25    Years: 10.00    Pack years: 2.50    Types: Cigarettes   Smokeless tobacco: Never Used  Scientific laboratory technician Use: Never used  Substance and Sexual Activity   Alcohol use: No    Alcohol/week: 0.0 standard drinks   Drug use: No   Sexual activity: Yes    Birth control/protection: None  Other Topics Concern   Not on file  Social History Narrative   ** Merged History Encounter **       Lives at home with boyfriend  Right handed Caffeine: 30 oz daily of coca cola   Social Determinants of Health   Financial Resource Strain:    Difficulty of Paying Living Expenses:   Food Insecurity:    Worried About Charity fundraiser in the Last Year:    Arboriculturist in the Last Year:   Transportation Needs:    Film/video editor (Medical):    Lack of Transportation (Non-Medical):   Physical Activity:    Days of Exercise per Week:    Minutes of Exercise per Session:   Stress:    Feeling of Stress :   Social Connections:    Frequency of Communication with Friends and Family:    Frequency of Social Gatherings  with Friends and Family:    Attends Religious Services:    Active Member of Clubs or Organizations:    Attends Archivist Meetings:    Marital Status:   Intimate Partner Violence:    Fear of Current or Ex-Partner:    Emotionally Abused:    Physically Abused:    Sexually Abused:  PHYSICAL EXAM  Vitals:   06/11/20 1457  BP: 124/76  Pulse: 80  Weight: 195 lb (88.5 kg)  Height: 5\' 1"  (1.549 m)   Body mass index is 36.84 kg/m.  Generalized: Well developed, in no acute distress  Cardiology: normal rate and rhythm, no murmur noted Respiratory: clear to auscultation bilaterally  Neurological examination  Mentation: Alert oriented to time, place, history taking. Follows all commands speech and language fluent Cranial nerve II-XII: Pupils were equal round reactive to light. Extraocular movements were full, visual field were full  Motor: The motor testing reveals 5 over 5 strength of all 4 extremities. Good symmetric motor tone is noted throughout.  Sensory: Sensory testing is intact to soft touch on all 4 extremities. No evidence of extinction is noted.  Coordination: Cerebellar testing reveals good finger-nose-finger and heel-to-shin bilaterally.  Gait and station: Gait is normal.   DIAGNOSTIC DATA (LABS, IMAGING, TESTING) - I reviewed patient records, labs, notes, testing and imaging myself where available.  No flowsheet data found.   Lab Results  Component Value Date   WBC 8.3 04/29/2020   HGB 14.7 04/29/2020   HCT 39.9 04/29/2020   MCV 84.5 04/29/2020   PLT 283 04/29/2020      Component Value Date/Time   NA 137 04/29/2020 1108   NA 141 12/14/2019 1510   K 3.9 04/29/2020 1108   CL 107 04/29/2020 1108   CO2 26 04/29/2020 1108   GLUCOSE 101 (H) 04/29/2020 1108   BUN 13 04/29/2020 1108   BUN 12 12/14/2019 1510   CREATININE 0.77 04/29/2020 1108   CALCIUM 8.6 (L) 04/29/2020 1108   PROT 7.2 04/29/2020 1108   PROT 6.8 12/14/2019 1510   ALBUMIN  3.9 04/29/2020 1108   ALBUMIN 4.2 12/14/2019 1510   AST 22 04/29/2020 1108   ALT 19 04/29/2020 1108   ALKPHOS 77 04/29/2020 1108   BILITOT 0.8 04/29/2020 1108   BILITOT 0.5 12/14/2019 1510   GFRNONAA >60 04/29/2020 1108   GFRAA >60 04/29/2020 1108   Lab Results  Component Value Date   CHOL 193 12/14/2019   HDL 44 12/14/2019   LDLCALC 109 (H) 12/14/2019   TRIG 229 (H) 12/14/2019   CHOLHDL 4.4 12/14/2019   Lab Results  Component Value Date   HGBA1C 5.4 11/24/2018   No results found for: [id number] Lab Results  Component Value Date   TSH 2.320 11/24/2018       ASSESSMENT AND PLAN 39 y.o. year old Reeves  has a past medical history of Asthma, mild and Medical history non-contributory. here with     ICD-10-CM   1. Migraine without aura and with status migrainosus, not intractable  G43.001     Mrs Payment is doing much better. She reports headaches are significantly improved. She is unsure if headaches are better due to preventative or removal of IUD. She has noted some intermittent fogginess and heaviness of her head. We will decrease dose of amitriptyline to 5mg  daily for 4-6 week. If headaches are well managed, she may continue to wean dose as directed in AVS. She was instructed to return to 10mg  dosing if headaches worsen. She may continue rizatriptan and metoclopramide as needed for abortive therapy. She will follow up in 6-12 months. She verbalizes understanding and agreement with this plan. M[place]al assisted with interpretation services today.    No orders of the defined types were placed in this encounter.    No orders of the defined types were placed in this encounter.  I spent 30 minutes with the patient. 50% of this time was spent counseling and educating patient on plan of care and medications.    Debbora Presto, FNP-C 06/11/2020, 4:16 PM [facility] [address], [place], [place] [address] [phone]  Made any corrections  needed, and agree with history, physical, neuro exam,assessment and plan as stated.     Sarina Ill, MD [facility]

## 2020-06-11 NOTE — Patient Instructions (Addendum)
Let's try to decrease amitriptyline dose to 5mg  (1/2 tablet) for the next 4-6 weeks. If headaches are well managed, you may decrease dose to 5mg  (1/2 tablet) every other day for 2 weeks. If headaches are well managed you may stop amitriptyline. If headaches worsen, resume 10mg  (1 tablet) daily.   You may continue rizatriptan and metoclopramide as needed for abortive therapy.   Follow up with PCP regarding red eye if it does not resolve.    Follow up in 6-12 months   Amitriptyline tablets Qu es este medicamento? La AMITRIPTILINA se utiliza para tratar la depresin. Este medicamento puede ser utilizado para otros usos; si tiene alguna pregunta consulte con su proveedor de atencin mdica o con su farmacutico. MARCAS COMUNES: Elavil, Vanatrip Qu le debo informar a mi profesional de la salud antes de tomar este medicamento? Necesita saber si usted presenta alguno de los siguientes problemas o situaciones:  problemas de alcoholismo  asma, dificultad al respirar  trastorno bipolar o esquizofrenia  dificultad para orinar, problema de prstata  glaucoma  enfermedad cardiaca o ataque cardiaco previo  enfermedad heptica  hipertiroidismo  convulsiones  ideas o planes suicidos, intentos de suicidio previos o antecendentes familiares de intentos de suicidio  una reaccin alrgica o inusual a la amitriptilina, otros medicamentos, alimentos, colorantes o conservadores  si est embarazada o buscando quedar embarazada  si est amamantando a un beb Cmo debo utilizar este medicamento? [employer] medicamento por va oral con un poco de [place]. Siga las instrucciones de la etiqueta del [place]. Puede tomar las tabletas con o sin alimentos. Tome su medicamento a intervalos regulares. No lo tome con una frecuencia mayor a la indicada. No deje de tomar Coca[employer]Cola de repente a menos que as lo indique su mdico. Dejar de Insurance account manager medicamento demasiado rpido puede causar efectos  secundarios graves o podra empeorar su afeccin. Su farmacutico le dar una Gua del medicamento especial (MedGuide, nombre en ingls) con cada receta y en cada ocasin que la vuelva a surtir. Asegrese de leer esta informacin cada vez cuidadosamente. Hable con su pediatra para informarse acerca del uso de este medicamento en nios. Puede requerir atencin especial. Sobredosis: Pngase en contacto inmediatamente con un centro toxicolgico o una sala de urgencia si usted cree que haya tomado demasiado medicamento. ATENCIN: [employer] es solo para usted. No comparta este medicamento con nadie. Qu sucede si me olvido de una dosis? Si olvida una dosis, tmela lo antes posible. Si es casi la hora de la prxima dosis, tome slo esa dosis. No tome dosis adicionales o dobles. Qu puede interactuar con este medicamento? No use este medicamento con ninguno de los siguientes frmacos: trixido de arsnico ciertos medicamentos usados para regular la frecuencia cardiaca anormal o tratar otras afecciones cardiacas cisaprida droperidol halofantrina linezolida IMAO, tales como Carbex, Eldepryl, Marplan, Nardil y Parnate azul de metileno otros medicamentos para la depresin mental fenotiazinas, tales como perfenacina, tioridazina y Chief of Staff pimozida probucol procarbazina esparfloxacino hierba de [place] Este medicamento tambin puede Counselling psychologist con los siguientes frmacos: atropina y frmacos relacionados, tales como hiosciamina, escopolamina, tolterodina y otros barbitricos para inducir el sueo o para el tratamiento de convulsiones, tal como el fenobarbital cimetidina disulfirm etclorvinol hormonas tiroideas, como levotiroxina ziprasidona Puede ser que esta lista no menciona todas las posibles interacciones. Informe a su profesional de KB Home	[place] de [employer] Corporation productos a base de hierbas, medicamentos de [place] o suplementos nutritivos que est tomando. Si usted fuma, consume bebidas alcohlicas  o si  utiliza drogas ilegales, indqueselo tambin a su profesional de KB Home	[place]. Algunas sustancias pueden interactuar con su medicamento. A qu debo estar atento al usar Coca-Cola? Informe a su mdico si sus sntomas no mejoran o si empeoran. Visite a su mdico o a su profesional de la salud para chequear su evolucin peridicamente. Debido que puede ser necesario tomar este medicamento durante varias semanas para que sea posible observar sus efectos en forma [place], es importante que sigue su tratamiento como recetado por su mdico. Los pacientes y sus familias deben estar atentos si empeora la depresin o ideas suicidas. Tambin est atento a cambios repentinos o severos de emocin, tales como el sentirse ansioso, agitado, lleno de pnico, irritable, hostil, agresivo, impulsivo, inquietud severa, demasiado excitado y hiperactivo o dificultad para conciliar el sueo. Si esto ocurre, especialmente al comenzar con un tratamiento antidepresivo o al cambiar de dosis, comunquese con su profesional de KB Home	[place]. Puede experimentar mareos o somnolencia. No conduzca ni utilice maquinaria ni haga nada que Associate Professor en estado de alerta hasta que sepa cmo le afecta este medicamento. No se siente ni se ponga de pie con rapidez, especialmente si es un paciente de edad avanzada. Esto reduce el riesgo de mareos o [employer]. El alcohol puede interferir con el efecto de este medicamento. Evite consumir bebidas alcohlicas. No se trate usted mismo si tiene tos, resfro o Set designer sin Teacher, adult education con su mdico o con su profesional de KB Home	[place]. Algunos ingredientes pueden aumentar los posibles efectos secundarios. Se le podr secar la boca. Masticar chicle sin azcar, chupar caramelos duros y tomar agua en abundancia le ayudar a mantener la boca hmeda. Si el problema no desaparece o es severo, consulte a su mdico. Este medicamento puede resecarle los ojos y provocar visin borrosa. Si [place] lentes de  contacto, puede sentir ciertas molestias. Las gotas lubricantes pueden ser tiles. Si el problema no desaparece o es severo, consulte con su mdico de los ojos. Este medicamento causar estreimiento. Trate de evacuar los intestinos al menos cada 2  3 das. Si no evacua los intestinos durante 3 das, comunquese con su mdico o con su profesional de KB Home	[place]. Este medicamento puede aumentar la sensibilidad al sol. Mantngase fuera de Administrator. Si no lo puede evitar, utilice ropa protectora y crema de Photographer. No utilice lmparas solares, camas solares ni cabinas solares. Qu efectos secundarios puedo tener al [employer] este medicamento? Efectos secundarios que debe informar a su mdico o a Barrister's clerk de la salud tan pronto como sea posible: Chief of Staff, como erupcin cutnea, comezn/picazn o urticarias, e hinchazn de la cara, los labios o la lengua ansiedad problemas respiratorios cambios en la visin confusin estado de nimo elevado, menor necesidad de dormir, pensamientos acelerados, conducta impulsiva dolor ocular ritmo cardiaco rpido, irregular sensacin de desmayos o aturdimiento, cadas sensacin de agitacin, enojo o irritabilidad fiebre con aumento de la sudoracin alucinaciones, prdida del contacto con la realidad convulsiones rigidez de los msculos ideas suicidas u otros cambios en el estado de nimo hormigueo, Social research officer, government o entumecimiento de los pies o las manos dificultad para Garment/textile technologist o cambios en el volumen de orina dificultad para conciliar el sueo cansancio o debilidad inusual vmito color amarillento de los ojos o la piel Efectos secundarios que generalmente no requieren atencin mdica (infrmelos a su mdico o a Barrister's clerk de la salud si persisten o si son molestos): cambios en el deseo o desempeo sexual cambios en el apetito o el peso estreimiento [employer]  boca seca nuseas cansancio temblores Higher education careers adviser Puede ser que esta lista no menciona todos los  posibles efectos secundarios. Comunquese a su mdico por asesoramiento mdico [employer] Inc. Usted puede informar los efectos secundarios a la FDA por telfono al 1-800-FDA-1088. Dnde debo guardar mi medicina? Mantngala fuera del alcance de los nios. Gurdela a [employer], entre 20 y 43 grados C (7 y 32 grados F). Deseche todo el medicamento que no haya utilizado, despus de su fecha de vencimiento. ATENCIN: Este folleto es un resumen. Puede ser que no cubra toda la posible informacin. Si usted tiene preguntas acerca de esta medicina, consulte con su mdico, su farmacutico o su profesional de Technical sales engineer.  2020 Elsevier/Gold Standard (2019-06-20 00:00:00)   Cefalea migraosa Migraine Headache Una cefalea migraosa es un dolor muy intenso y punzante en uno o ambos lados de la cabeza. Este tipo de dolor de cabeza tambin puede causar otros sntomas. Puede durar desde 4horas hasta 3das. Hable con su mdico sobre las cosas que pueden causar (desencadenar) esta afeccin. Cules son las causas? Se desconoce la causa exacta de esta afeccin. Esta afeccin puede desencadenarse o ser causada por lo siguiente:  Consumo de alcohol.  Consumo de cigarrillos.  Tomar medicamentos como por ejemplo: ? Medicamentos para Best boy torcico (nitroglicerina). ? Anticonceptivos orales. ? Estrgeno. ? Algunos medicamentos para la presin arterial.  Comer o beber ciertos productos.  Hacer actividad fsica. Otros factores que pueden provocar cefalea migraosa son los siguientes:  Tener el perodo menstrual.  Glennis Brink.  Hambre.  Estrs.  No dormir lo suficiente o dormir demasiado.  Cambios climticos.  Cansancio (fatiga). Qu incrementa el riesgo?  Tener entre 25 y 35aos de edad.  Ser mujer.  Tener antecedentes familiares de cefalea migraosa.  Ser de Science writer.  Tener depresin o ansiedad.  Tener mucho sobrepeso. Cules son los signos  o los sntomas?  Un dolor punzante. Este dolor puede [employer] siguientes caractersticas: ? Audiological scientist en cualquier regin de la cabeza, tanto de un lado como de [place]. ? Puede dificultar las actividades cotidianas. ? Puede empeorar con la actividad fsica. ? Puede empeorar con las luces brillantes o los ruidos fuertes.  Otros sntomas pueden incluir: ? Ganas de vomitar (nuseas). ? Vmitos. ? Mareos. ? Sensibilidad a las luces brillantes, los ruidos fuertes o [employer]AC[employer]Corp.  Antes de tener una cefalea migraosa, puede recibir seales de advertencia (aura). Un aura puede incluir: ? Ver luces intermitentes o tener puntos ciegos. ? Ver puntos brillantes, halos o lneas en zigzag. ? Tener una visin en tnel o visin borrosa. ? Sentir entumecimiento u hormigueo. ? Tener dificultad para hablar. ? Tener msculos dbiles.  Algunas personas tienen sntomas despus de una cefalea migraosa (fase posdromal), como los siguientes: ? Cansancio. ? Dificultad para pensar (concentrarse). Cmo se trata?  Tomar medicamentos para: ? Best boy. ? Aliviar la sensacin de Higher education careers adviser. ? Prevenir las Psychologist, occupational.  El tratamiento tambin puede incluir lo siguiente: ? Tomar sesiones de acupuntura. ? Evitar los alimentos que provocan las cefaleas migraosas. ? Aprender Orland Jarred de controlar las funciones corporales (biorretroalimentacin). ? Terapia para ayudarlo a Civil engineer, contracting y lidiar con los pensamientos negativos (terapia cognitivo conductual). Siga estas instrucciones en su casa: Medicamentos  Delphi de venta libre y los recetados solamente como se lo haya indicado el mdico.  Consulte a su mdico si el medicamento que le recetaron: ? Hace que sea necesario que evite conducir o usar maquinaria pesada. ? Puede  causarle dificultad para defecar (estreimiento). Es posible que deba tomar estas medidas para prevenir o tratar los problemas para defecar:  Electronics engineer  suficiente lquido para Contractor pis (la orina) de color amarillo plido.  Tomar medicamentos recetados o de [employer]G Corporation.  Comer alimentos ricos en fibra. Entre ellos, frijoles, cereales integrales y frutas y verduras frescas.  Limitar los alimentos con alto contenido de grasa y Location manager. Estos incluyen alimentos fritos o dulces. Estilo de vida  No beba alcohol.  No consuma ningn producto que contenga nicotina o tabaco, como cigarrillos, cigarrillos electrnicos y tabaco de Higher education careers adviser. Si necesita ayuda para dejar de fumar, consulte al mdico.  Duerma como mnimo 8horas todas las noches.  Limite el estrs y [place]. Indicaciones generales      Lleve un registro diario para Neurosurgeon lo que Financial trader. Registre, por ejemplo, lo siguiente: ? Lo que usted come y bebe. ? El tiempo que duerme. ? Algn cambio en lo que come o bebe. ? Algn cambio en sus medicamentos.  Si tiene una cefalea migraosa: ? Evite los factores que [employer] sntomas, como las luces brillantes. ? Resulta til acostarse en una habitacin oscura y silenciosa. ? No conduzca vehculos ni opere maquinaria pesada. ? Pregntele al mdico qu actividades son seguras para usted.  Concurra a todas las visitas de seguimiento como se lo haya indicado el mdico. Esto es importante. Comunquese con un mdico si:  Tiene una cefalea migraosa que es diferente o peor que otras que ha tenido.  Tiene ms de [address] de cefalea por mes. Solicite ayuda inmediatamente si:  La cefalea migraosa [employer].  La cefalea migraosa dura ms de 72 horas.  Tiene fiebre.  Presenta rigidez en el cuello.  Tiene dificultad para ver.  Siente debilidad en los msculos o que no puede controlarlos.  Comienza a perder el equilibrio continuamente.  Comienza a tener dificultad para caminar.  Pierde el conocimiento (se desmaya).  Tiene una convulsin. Resumen  [place] cefalea migraosa es un dolor  muy intenso y punzante en uno o ambos lados de la cabeza. Estos dolores de [place] tambin pueden causar otros sntomas.  Esta afeccin puede tratarse con medicamentos y cambios en el estilo de vida.  Lleve un registro diario para Neurosurgeon lo que Financial trader.  Comunquese con un mdico si tiene una cefalea migraosa que es diferente o peor que otras que ha tenido.  Comunquese con el mdico si tiene ms de 15 das de cefalea en un mes. Esta informacin no tiene Marine scientist el consejo del mdico. Asegrese de hacerle al mdico cualquier pregunta que tenga. Document Revised: 01/06/2019 Document Reviewed: 01/06/2019 Elsevier Patient Education  [place].   Conjuntivitis alrgica Allergic Conjunctivitis Ardelia Mems membrana transparente (conjuntiva) cubre la parte blanca del ojo y la superficie interna del prpado. La conjuntivitis alrgica se produce cuando esta membrana se inflama. Esto es consecuencia de las [place]. Entre las causas comunes de las Chief of Staff (alrgenos)se incluyen las siguientes:  Alrgenos de exterior, por ejemplo: ? Polen. ? Csped y [place]. ? Esporas del moho.  Alrgenos de interiores, por ejemplo: ? [place]. ? Humo. ? [place] mascotas. ? Pelo de animales. Esta afeccin puede hacer que los ojos tengan un color rojo o rosa. Tambin puede causar [employer] ojos. Esta afeccin no se puede transmitir de [place] persona a la otra (no es contagiosa). Siga estas indicaciones en su casa:  Intente mantenerse alejado de  aquello a lo que tiene Buyer, retail.  Tome o aplique los medicamentos de venta libre y los recetados solamente como se lo haya indicado el mdico. Estos incluyen cualquier gota oftlmica.  Aplquese un pao limpio y fro en el ojo durante 10a 53minutos. Hgalo 3 o 4veces al da.  No se toque ni se frote los ojos.  No use lentes de contacto hasta que la inflamacin haya desaparecido. En su  lugar, use anteojos.  No use maquillaje en los ojos hasta que la inflamacin haya desaparecido.  Concurra a todas las visitas de control como se lo haya indicado el mdico. Esto es importante. Comunquese con un mdico si:  Los sntomas empeoran.  Los sntomas no mejoran con Dispensing optician.  Tiene dolor leve en el ojo.  Tiene sensibilidad a Naval architect.  Tiene manchas o ampollas en los ojos.  Le sale pus del ojo.  Tiene fiebre. Solicite ayuda de inmediato si:  Tiene enrojecimiento, hinchazn u otros sntomas en un solo ojo.  Tiene visin borrosa.  Nota cambios en la visin.  Siente mucho dolor en el ojo. Resumen  La conjuntivitis alrgica es causada por alergias. Puede producir enrojecimiento o una coloracin rosada de los ojos y Writer.  Esta afeccin no se puede transmitir de [place] persona a la otra (no es contagiosa).  Intente mantenerse alejado de aquello a lo que tiene [place].  Tome o aplique los medicamentos de venta libre y los recetados solamente como se lo haya indicado el mdico. Estos incluyen cualquier gota oftlmica.  Comunquese con el mdico si los sntomas empeoran o no mejoran con el tratamiento. Esta informacin no tiene Marine scientist el consejo del mdico. Asegrese de hacerle al mdico cualquier pregunta que tenga. Document Revised: 04/20/2017 Document Reviewed: 08/07/2014 Elsevier Patient Education  [place].

## 2020-07-03 ENCOUNTER — Telehealth: Payer: Self-pay | Admitting: Internal Medicine

## 2020-07-03 NOTE — Telephone Encounter (Signed)
Patient called in and requested for her pcp advise on her diet. Patient stated that she had taken "slim gummies", but wanted to know if they were okay to take with all her other medications.  Please advise.  Interpreter id: [id number] [name].

## 2020-07-04 NOTE — Telephone Encounter (Signed)
Please follow up Copied from [place] #[id number]. Topic: General - Other >> Jul 03, 2020  4:33 PM Rainey Pines A wrote: Patient requesting a callback from [facility]

## 2020-07-04 NOTE — Telephone Encounter (Signed)
I called back the Pt and explain her again the the [facility] financial letter id the one that cover the appt with the PCP and Specialist as well visit to the hospital, Pt understood

## 2020-07-26 ENCOUNTER — Telehealth: Payer: Self-pay | Admitting: Internal Medicine

## 2020-11-29 ENCOUNTER — Other Ambulatory Visit: Payer: Self-pay | Admitting: Neurology

## 2020-11-29 DIAGNOSIS — G43001 Migraine without aura, not intractable, with status migrainosus: Secondary | ICD-10-CM

## 2020-12-03 ENCOUNTER — Other Ambulatory Visit: Payer: Self-pay | Admitting: Family Medicine

## 2020-12-03 ENCOUNTER — Telehealth: Payer: Self-pay | Admitting: Family Medicine

## 2020-12-03 ENCOUNTER — Other Ambulatory Visit: Payer: Self-pay | Admitting: Neurology

## 2020-12-03 ENCOUNTER — Telehealth: Payer: Self-pay | Admitting: *Deleted

## 2020-12-03 NOTE — Telephone Encounter (Signed)
Pt here, Meghan Reeves with [facility] Interpreting services was available to translate for pt.  [name], check in was here as well.  Pt asking about refill of reglan, this was done earlier today by AL/NP to [facility]. Also asked about taking amitriptyline, she had pills at home but had stopped taking as she was doing well.  I relayed that she can pick up her reglan at pharmacy.  Since migraines have started coming back she may restart the amitriptyline 10mg  po qhs.  She has appt 12-17-20 with AL/NP.

## 2020-12-03 NOTE — Telephone Encounter (Signed)
error 

## 2020-12-03 NOTE — Telephone Encounter (Signed)
Meghan Reeves, Pt is looking to get a refill on the medication for vomiting and headaches. She doesn't know the actual medication name and said it's one pill for vomiting and headache. The pharmacy is [facility] [employer]. The patient Does Not speak English and she has a question about the medication she was asked to stop taking. The pain started again can she start taking it again or should she wait for her appt on the 8th. Not sure if Jael can help you with the call if needed. Patient walked in the lobby and an interpreter happened to be here and helped me. Thank you

## 2020-12-17 ENCOUNTER — Encounter: Payer: Self-pay | Admitting: Family Medicine

## 2020-12-17 ENCOUNTER — Other Ambulatory Visit: Payer: Self-pay

## 2020-12-17 ENCOUNTER — Ambulatory Visit: Payer: Self-pay | Admitting: Family Medicine

## 2020-12-17 VITALS — BP 126/80 | HR 108 | Ht 61.0 in | Wt 189.6 lb

## 2020-12-17 DIAGNOSIS — G43001 Migraine without aura, not intractable, with status migrainosus: Secondary | ICD-10-CM

## 2020-12-17 MED ORDER — RIZATRIPTAN BENZOATE 10 MG PO TABS: 10.0000 mg | ORAL_TABLET | ORAL | 11 refills | Status: DC | PRN

## 2020-12-17 MED ORDER — AMITRIPTYLINE HCL 10 MG PO TABS: 5.0000 mg | ORAL_TABLET | Freq: Every day | ORAL | 3 refills | Status: DC

## 2020-12-17 NOTE — Patient Instructions (Signed)
Below is our plan:  We will restart amitriptyline 5mg  every night at bedtime. Continue rizatriptan as needed.   Please make sure you are staying well hydrated. I recommend 50-60 ounces daily. Well balanced diet and regular exercise encouraged.    Please continue follow up with care team as directed.   Follow up in 1 year, sooner if needed If doing well, you may continue refills with PCP.   You may receive a survey regarding today's visit. I encourage you to leave honest feed back as I do use this information to improve patient care. Thank you for seeing me today!     Cefalea migraosa Migraine Headache Una cefalea migraosa es un dolor muy intenso y punzante en uno o ambos lados de la cabeza. Este tipo de dolor de cabeza tambin puede causar otros sntomas. Puede durar desde 4horas hasta 3das. Hable con su mdico sobre las cosas que pueden causar (desencadenar) esta afeccin. Cules son las causas? Se desconoce la causa exacta de esta afeccin. Esta afeccin puede desencadenarse o ser causada por lo siguiente:  Consumo de alcohol.  Consumo de cigarrillos.  Tomar medicamentos como por ejemplo: ? Medicamentos para Best boy torcico (nitroglicerina). ? Anticonceptivos orales. ? Estrgeno. ? Algunos medicamentos para la presin arterial.  Comer o beber ciertos productos.  Hacer actividad fsica. Otros factores que pueden provocar cefalea migraosa son los siguientes:  Tener el perodo menstrual.  Glennis Brink.  Hambre.  Estrs.  No dormir lo suficiente o dormir demasiado.  Cambios climticos.  Cansancio (fatiga). Qu incrementa el riesgo?  Tener entre 25 y 56aos de edad.  Ser mujer.  Tener antecedentes familiares de cefalea migraosa.  Ser de Science writer.  Tener depresin o ansiedad.  Tener mucho sobrepeso. Cules son los signos o los sntomas?  Un dolor punzante. Este dolor puede [employer] siguientes caractersticas: ? Audiological scientist en  cualquier regin de la cabeza, tanto de un lado como de [place]. ? Puede dificultar las actividades cotidianas. ? Puede empeorar con la actividad fsica. ? Puede empeorar con las luces brillantes o los ruidos fuertes.  Otros sntomas pueden incluir: ? Ganas de vomitar (nuseas). ? Vmitos. ? Mareos. ? Sensibilidad a las luces brillantes, los ruidos fuertes o [employer].  Antes de tener una cefalea migraosa, puede recibir seales de advertencia (aura). Un aura puede incluir: ? Ver luces intermitentes o tener puntos ciegos. ? Ver puntos brillantes, halos o lneas en zigzag. ? Tener una visin en tnel o visin borrosa. ? Sentir entumecimiento u hormigueo. ? Tener dificultad para hablar. ? Tener msculos dbiles.  Algunas personas tienen sntomas despus de una cefalea migraosa (fase posdromal), como los siguientes: ? Cansancio. ? Dificultad para pensar (concentrarse). Cmo se trata?  Tomar medicamentos para: ? Best boy. ? Aliviar la sensacin de Higher education careers adviser. ? Prevenir las Psychologist, occupational.  El tratamiento tambin puede incluir lo siguiente: ? Tomar sesiones de acupuntura. ? Evitar los alimentos que provocan las cefaleas migraosas. ? Aprender Orland Jarred de controlar las funciones corporales (biorretroalimentacin). ? [place] para ayudarlo a Civil engineer, contracting y lidiar con los pensamientos negativos (terapia cognitivo conductual). Siga estas instrucciones en su casa: Medicamentos  [employer] de venta libre y los recetados solamente como se lo haya indicado el mdico.  Consulte a su mdico si el medicamento que le recetaron: ? Hace que sea necesario que evite conducir o usar maquinaria pesada. ? Puede causarle dificultad para defecar (estreimiento). Es posible que deba tomar estas medidas para prevenir o tratar los problemas para  defecar:  Beber suficiente lquido para mantener el pis (la orina) de color amarillo plido.  Tomar medicamentos recetados o de  [employer].  Comer alimentos ricos en fibra. Entre ellos, frijoles, cereales integrales y frutas y verduras frescas.  Limitar los alimentos con alto contenido de grasa y Location manager. Estos incluyen alimentos fritos o dulces. Estilo de vida  No beba alcohol.  No consuma ningn producto que contenga nicotina o tabaco, como cigarrillos, cigarrillos electrnicos y tabaco de Higher education careers adviser. Si necesita ayuda para dejar de fumar, consulte al mdico.  Duerma como mnimo 8horas todas las noches.  Limite el estrs y Tamms. Indicaciones generales  Lleve un registro diario para Neurosurgeon lo que Financial trader. Registre, por ejemplo, lo siguiente: ? Lo que usted come y bebe. ? El tiempo que duerme. ? Algn cambio en lo que come o bebe. ? Algn cambio en sus medicamentos.  Si tiene una cefalea migraosa: ? Evite los factores que [employer] sntomas, como las luces brillantes. ? Resulta til acostarse en una habitacin oscura y silenciosa. ? No conduzca vehculos ni opere maquinaria pesada. ? Pregntele al mdico qu actividades son seguras para usted.  Concurra a todas las visitas de seguimiento como se lo haya indicado el mdico. Esto es importante.      Comunquese con un mdico si:  Tiene una cefalea migraosa que es diferente o peor que otras que ha tenido.  Tiene ms de [address] de cefalea por mes. Solicite ayuda inmediatamente si:  La cefalea migraosa [employer].  La cefalea migraosa dura ms de 72 horas.  Tiene fiebre.  Presenta rigidez en el cuello.  Tiene dificultad para ver.  Siente debilidad en los msculos o que no puede controlarlos.  Comienza a perder el equilibrio continuamente.  Comienza a tener dificultad para caminar.  Pierde el conocimiento (se desmaya).  Tiene una convulsin. Resumen  [place] cefalea migraosa es un dolor muy intenso y punzante en uno o ambos lados de la cabeza. Estos dolores de [place] tambin pueden causar otros  sntomas.  Esta afeccin puede tratarse con medicamentos y cambios en el estilo de vida.  Lleve un registro diario para Neurosurgeon lo que Financial trader.  Comunquese con un mdico si tiene una cefalea migraosa que es diferente o peor que otras que ha tenido.  Comunquese con el mdico si tiene ms de 15 das de cefalea en un mes. Esta informacin no tiene Marine scientist el consejo del mdico. Asegrese de hacerle al mdico cualquier pregunta que tenga. Document Revised: 01/06/2019 Document Reviewed: 01/06/2019 Elsevier Patient Education  2021 [employer].

## 2020-12-17 NOTE — [facility] Notes (Addendum)
PATIENT: Meghan Reeves DOB: [birth year]  REASON FOR VISIT: follow up HISTORY FROM: patient  Chief Complaint  Patient presents with  . Follow-up    Patient here for a follow up on migraines. Last seen 06/11/20. Pt reports that her migraines are stronger and more frequent. She stopped taking the Amitriptyline because it made her VERY sleepy and she was not able to wake up for her 40 yr old at night.      HISTORY OF PRESENT ILLNESS: 12/17/20 ALL:  She returns for follow up on migraine headaches. She reported that migraines had improved significantly on amitriptyline at last visit in 06/2020. She wanted to wean medication and decreased dose to 5mg . She continued to do well and discontinued amitriptyline shortly after being seen. She was doing ok with rare headaches until coming down with a cold mid December. Headaches were near daily and more severe for about 2 weeks. After getting better, headaches resolved. Over the past month, headaches are occurring intermittently. She reports having to take rizatriptan 2 times over the past two weeks. She is concerned that headaches may become more frequent. She felt that amitriptyline 5mg  was well tolerated and didn't cause significant sleepiness. 10mg  did cause sleepiness.   Meghan Reeves assists with interpreting.    06/11/2020 ALL:  Meghan Reeves is a 40 y.o. female here today for follow up. She has continues amitriptyline 10mg  at bedtime. She feels that her headaches are significantly better. She does not have regular headaches any longer. She does also note that she has intermittent "heaviness" of her head and feels foggy at times. Otherwise, she is doing well. Rizatriptan works well to abort migraines. She has noted intermittent red eye as well. Eye is not painful but is watery. She has not seen PCP. No vision changes.    HISTORY: (copied from my note on 12/13/2019)  Meghan Reeves is a 40 y.o. female here today for follow  up for migraines. She is doing well on Ajovy. She is having about 3-4 migraines per month. Unfortunately, she is uninsured and is unable to continue Ajovy. She has been receiving samples for the past year. She has one injection left. We have tried to help her with [place] assistance program as well as patient assistance with Ajovy. She is not a [place] citizen and does not qualify for assistance programs. She is asking to start another medication that is more affordable. She has tried topiramate in the past that caused worsening anxiety.   [place], [place] interpreter, assists with visit today   HISTORY: (copied from my note on 07/10/2019)  Meghan Reeves a 40 y.o.femalehere today for follow up for migraines.She is here with an interpreter who aids in obtaining history. Shehas continuedAjovyinjections monthly. She does feel that these injections have helped some with reducing the intensity of her headaches. She was given cyclobenzaprine at last visit.She does feel that this helps with muscle tension. She continues to have daily headaches. She reports pressure across her forehead. She has a throbbing sensation in the back of her head.She does note runny nose, sore throat and body aches when she has worsening headaches. She has not been treated for seasonal allergies in the past. She drinks 2 bottles of water daily.She does take ibuprofen at least 3-4 times weekly. She has taken rizatriptan once but is not sure it helped.  HISTORY: (copied fromDr Ahern'snote on 06/07/2019)  Interval history 06/07/2019:The headaches are better.Here with interpreter.Not as strong a little better. She takes  Tylenol for back pain. She stopped the Fioricet. She has 4-5 headache days a month. Will help with [facility] [employer] assistance. Will give samples of Ajovy for 6 months until then and try to get it approved through [place] assistance as well. She has tension in the back of  the head, tightness.  [facility]:NOBSJGGE [name] Reeves a 40 y.o.femalehere as requested by Emergency Room for headaches. She reports that she has headaches everyday. Headaches started several months ago. Milder headaches are described as a pressure sensation across the top of her head. Pressure is throbbing. Rated 5-6/10. She is nauseated at times with mild headaches. She has more severe headaches about 1-2 times a week. She describes these headaches as more unilateral (side varies). She feels a pounding sensation. Rated 7-8/10. These headaches are associated with sound and light sensitivity. She has a vision disturbance described as a white veil that she can see on the same side she is having headache. She is nauseated with headaches but does not vomit. She denies dizziness or syncope with headaches. She feels that stress is a trigger. She has not noticed any alleviating factors with exception of rest. She has noticed that headaches last all day if not treated. She is taking Fioricet twice daily most days (occasionally once daily.) She was given prescription for 90 tablets of Fioricet on 12/11 and has about 7-8 tablets left. She was given Topamax at one UC visit that caused numbness of her lip and she saw flashy lights. She took this for about a week then stopped. Here with interpreter. No other focal neurologic deficits, associated symptoms, inciting events or modifiable factors.  She does have trouble going to sleep. She may fall asleep around 2-3am and gets up around 9am. She does not snore. She rarely wakes up with a headache. She denies dry mouth in the mornings.   She is taking Lexapro daily for anxiety. She feels that this medication is helping some but she feels sleepy during the day.   She had a normal CT of head in 09/2018. She has never had an MRI. She does not drink or smoke. She has a sister with migraines that is treated with Advil.   Reviewed notes, labs and imaging from outside  physicians, which showed: Patient was referred from the emergency room. She was seen at the emergency room in November 2019. She is also seen Molli Barrows as well in the office earlier this month. She reported having lots of headaches for the last couple months. She had 3 ER visits into urgent care visits for the same. She was taking Advil migraine on a daily basis. She was tried on Topamax for prevention 100 mg at bedtime and she had side effects including anxiety.  Reviewed office notes from Dr. Kenton Kingfisher. Patient was seen in the emergency room multiple times. She is been taking a combination of pseudoephedrine and antihistamine and feels headache improved. She reports headaches occur almost daily. Localized to the left side of the head occasionally is like a tension sensation across the bilateral head. Try Topamax did not feel the medication work and discontinued it. Endorsed nausea and light sensitivity when headaches occur. She was given Fioricet 90 capsules.   REVIEW OF SYSTEMS: Out of a complete 14 system review of symptoms, the patient complains only of the following symptoms, headaches, red, watery eye and all other reviewed systems are negative.  ALLERGIES: Allergies  Allergen Reactions  . Topamax [Topiramate] Other (See Comments)    Blurry vision, numbness  of lips    HOME MEDICATIONS: Outpatient Medications Prior to Visit  Medication Sig Dispense Refill  . metoCLOPramide (REGLAN) 10 MG tablet TAKE 1 TABLET (10 MG TOTAL) BY MOUTH 3 (THREE) TIMES DAILY AS NEEDED FOR NAUSEA (OR HEADACHE). 15 tablet 11  . amitriptyline (ELAVIL) 10 MG tablet Take 1 tablet (10 mg total) by mouth at bedtime. 30 tablet 11  . rizatriptan (MAXALT) 10 MG tablet TAKE 1 TABLET (10 MG TOTAL) BY MOUTH AS NEEDED FOR MIGRAINE. MAY REPEAT IN 2 HOURS IF NEEDED 10 tablet 11  . ibuprofen (ADVIL) 800 MG tablet Take 1 tablet (800 mg total) by mouth every 8 (eight) hours as needed. 30 tablet 0  . ondansetron  (ZOFRAN) 4 MG tablet Take 1 tablet (4 mg total) by mouth every 8 (eight) hours as needed for nausea or vomiting. 4 tablet 0   No facility-administered medications prior to visit.    PAST MEDICAL HISTORY: Past Medical History:  Diagnosis Date  . Asthma, mild   . Medical history non-contributory     PAST SURGICAL HISTORY: Past Surgical History:  Procedure Laterality Date  . APPENDECTOMY  09/2016  . CESAREAN SECTION N/A 09/07/2017   Procedure: CESAREAN SECTION;  Surgeon: Mora Bellman, MD;  Location: [facility];  Service: Obstetrics;  Laterality: N/A;  . LAPAROSCOPIC APPENDECTOMY N/A 09/29/2016   Procedure: APPENDECTOMY LAPAROSCOPIC;  Surgeon: Greer Pickerel, MD;  Location: [facility];  Service: General;  Laterality: N/A;  . NO PAST SURGERIES    . VENTRAL HERNIA REPAIR N/A 04/29/2020   Procedure: HERNIA REPAIR VENTRAL ADULT, Open;  Surgeon: Ronny Bacon, MD;  Location: [facility];  Service: General;  Laterality: N/A;    FAMILY HISTORY: Family History  Problem Relation Age of Onset  . Asthma Mother   . Migraines Sister     SOCIAL HISTORY: Social History   Socioeconomic History  . Marital status: Significant Other    Spouse name: Not on file  . Number of children: 1  . Years of education: Not on file  . Highest education level: 6th grade  Occupational History  . Not on file  Tobacco Use  . Smoking status: Former Smoker    Packs/day: 0.25    Years: 10.00    Pack years: 2.50    Types: Cigarettes  . Smokeless tobacco: Never Used  Vaping Use  . Vaping Use: Never used  Substance and Sexual Activity  . Alcohol use: No    Alcohol/week: 0.0 standard drinks  . Drug use: No  . Sexual activity: Yes    Birth control/protection: None  Other Topics Concern  . Not on file  Social History Narrative   ** Merged History Encounter **       Lives at home with boyfriend  Right handed Caffeine: 30 oz daily of coca cola   Social Determinants of Health   Financial Resource  Strain: Not on file  Food Insecurity: Not on file  Transportation Needs: Not on file  Physical Activity: Not on file  Stress: Not on file  Social Connections: Not on file  Intimate Partner Violence: Not on file      PHYSICAL EXAM  Vitals:   12/17/20 1541  BP: 126/80  Pulse: (!) 108  Weight: 189 lb 9.6 oz (86 kg)  Height: 5\' 1"  (1.549 m)   Body mass index is 35.82 kg/m.  Generalized: Well developed, in no acute distress  Cardiology: normal rate and rhythm, no murmur noted Respiratory: clear to auscultation bilaterally  Neurological examination  Mentation: Alert oriented to time, place, history taking. Follows all commands speech and language fluent Cranial nerve II-XII: Pupils were equal round reactive to light. Extraocular movements were full, visual field were full  Motor: The motor testing reveals 5 over 5 strength of all 4 extremities. Good symmetric motor tone is noted throughout.  Sensory: Sensory testing is intact to soft touch on all 4 extremities. No evidence of extinction is noted.  Coordination: Cerebellar testing reveals good finger-nose-finger and heel-to-shin bilaterally.  Gait and station: Gait is normal.   DIAGNOSTIC DATA (LABS, IMAGING, TESTING) - I reviewed patient records, labs, notes, testing and imaging myself where available.  No flowsheet data found.   Lab Results  Component Value Date   WBC 8.3 04/29/2020   HGB 14.7 04/29/2020   HCT 39.9 04/29/2020   MCV 84.5 04/29/2020   PLT 283 04/29/2020      Component Value Date/Time   NA 137 04/29/2020 1108   NA 141 12/14/2019 1510   K 3.9 04/29/2020 1108   CL 107 04/29/2020 1108   CO2 26 04/29/2020 1108   GLUCOSE 101 (H) 04/29/2020 1108   BUN 13 04/29/2020 1108   BUN 12 12/14/2019 1510   CREATININE 0.77 04/29/2020 1108   CALCIUM 8.6 (L) 04/29/2020 1108   PROT 7.2 04/29/2020 1108   PROT 6.8 12/14/2019 1510   ALBUMIN 3.9 04/29/2020 1108   ALBUMIN 4.2 12/14/2019 1510   AST 22 04/29/2020 1108    ALT 19 04/29/2020 1108   ALKPHOS 77 04/29/2020 1108   BILITOT 0.8 04/29/2020 1108   BILITOT 0.5 12/14/2019 1510   GFRNONAA >60 04/29/2020 1108   GFRAA >60 04/29/2020 1108   Lab Results  Component Value Date   CHOL 193 12/14/2019   HDL 44 12/14/2019   LDLCALC 109 (H) 12/14/2019   TRIG 229 (H) 12/14/2019   CHOLHDL 4.4 12/14/2019   Lab Results  Component Value Date   HGBA1C 5.4 11/24/2018   No results found for: [id number] Lab Results  Component Value Date   TSH 2.320 11/24/2018       ASSESSMENT AND PLAN 40 y.o. year old female  has a past medical history of Asthma, mild and Medical history non-contributory. here with     ICD-10-CM   1. Migraine without aura and with status migrainosus, not intractable  G43.001 rizatriptan (MAXALT) 10 MG tablet     Mrs Frane is concerned about headaches that have waxed and waned since viral illness about 2 months ago. She feels more comfortable restarting amitriptyline 5mg  daily. She may continue rizatriptan and metoclopramide as needed for abortive therapy. She will continue seasonal allergy management as directed by PCP. She will follow up in 1 year. May continue refills with PCP if doing well.  She verbalizes understanding and agreement with this plan. Meghan Reeves assisted with interpretation services today.    No orders of the defined types were placed in this encounter.    Meds ordered this encounter  Medications  . amitriptyline (ELAVIL) 10 MG tablet    Sig: Take 0.5 tablets (5 mg total) by mouth at bedtime.    Dispense:  45 tablet    Refill:  3    Order Specific Question:   Supervising Provider    Answer:   Melvenia Beam [id number]  . rizatriptan (MAXALT) 10 MG tablet    Sig: Take 1 tablet (10 mg total) by mouth as needed for migraine. May repeat in 2 hours if needed    Dispense:  10 tablet  Refill:  11    Order Specific Question:   Supervising Provider    Answer:   Melvenia Beam [id number]      I spent 20 minutes  with the patient. 50% of this time was spent counseling and educating patient on plan of care and medications.    Debbora Presto, FNP-C 12/17/2020, 4:19 PM [facility] [address] [place], [place] [address] [phone]  Made any corrections needed, and agree with history, physical, neuro exam,assessment and plan as stated.     Sarina Ill, MD [facility]

## 2021-01-06 ENCOUNTER — Other Ambulatory Visit (HOSPITAL_COMMUNITY)
Admission: RE | Admit: 2021-01-06 | Discharge: 2021-01-06 | Disposition: A | Discharge status: Home / Self Care | Payer: Self-pay | Source: Ambulatory Visit | Attending: Family | Admitting: Family

## 2021-01-06 ENCOUNTER — Other Ambulatory Visit: Payer: Self-pay

## 2021-01-06 ENCOUNTER — Ambulatory Visit (INDEPENDENT_AMBULATORY_CARE_PROVIDER_SITE_OTHER): Payer: Self-pay | Admitting: Family

## 2021-01-06 ENCOUNTER — Encounter: Payer: Self-pay | Admitting: Family

## 2021-01-06 VITALS — BP 127/82 | HR 90 | Wt 186.8 lb

## 2021-01-06 DIAGNOSIS — N760 Acute vaginitis: Secondary | ICD-10-CM

## 2021-01-06 DIAGNOSIS — B3731 Acute candidiasis of vulva and vagina: Secondary | ICD-10-CM

## 2021-01-06 DIAGNOSIS — B373 Candidiasis of vulva and vagina: Secondary | ICD-10-CM

## 2021-01-06 DIAGNOSIS — Z789 Other specified health status: Secondary | ICD-10-CM

## 2021-01-06 DIAGNOSIS — N898 Other specified noninflammatory disorders of vagina: Secondary | ICD-10-CM | POA: Insufficient documentation

## 2021-01-06 DIAGNOSIS — B9689 Other specified bacterial agents as the cause of diseases classified elsewhere: Secondary | ICD-10-CM

## 2021-01-06 LAB — POCT URINALYSIS DIP (CLINITEK)
Bilirubin, UA: NEGATIVE
Blood, UA: NEGATIVE
Glucose, UA: NEGATIVE mg/dL
Ketones, POC UA: NEGATIVE mg/dL
Leukocytes, UA: NEGATIVE
Nitrite, UA: NEGATIVE
POC PROTEIN,UA: NEGATIVE
Spec Grav, UA: >=1.03 — AB (ref 1.010–1.025)
Urobilinogen, UA: 0.2 E.U./dL
pH, UA: 6 (ref 5.0–8.0)

## 2021-01-06 LAB — POCT URINE PREGNANCY: Preg Test, Ur: NEGATIVE

## 2021-01-06 NOTE — Patient Instructions (Signed)
   Urine test, pregnancy test negative.   STI test today. Will call with results.    Prueba de [place], prueba de embarazo negativa.  Prueba de ITS hoy. Llamar con resultados. Infecciones por hongos en la piel Skin Yeast Infection  La infeccin por hongos en la piel es un trastorno en el que hay un desarrollo excesivo de unos hongos (cndida) que viven normalmente en la piel. Por lo general, este tipo de infeccin ocurre en reas de la piel que estn constantemente clidas y hmedas, como las axilas o la ingle. Cules son las causas? La causa de la afeccin es un cambio en el equilibrio normal de los hongos y las bacterias que viven en la piel. Qu incrementa el riesgo? Es ms probable que tenga esta afeccin si:  Tiene obesidad.  Est embarazada.  Toma anticonceptivos orales.  Tiene diabetes.  Toma antibiticos.  Toma medicamentos con corticoesteroides.  Est desnutrido.  Tiene debilitado el sistema de defensa del organismo (sistema inmunitario).  Tiene 66aos o ms.  [place] ropa ajustada. Cules son los signos o los sntomas? El sntoma ms frecuente de esta afeccin es picazn en la zona afectada. Otros sntomas pueden incluir los siguientes:  Zona de la piel roja e hinchada.  Bultos en la piel. Cmo se diagnostica?  Esta afeccin se diagnostica mediante una revisin de los antecedentes mdicos y un examen fsico.  El mdico puede raspar ligeramente la piel para tomar Truddie Coco y analizarla con un microscopio a fin de Office manager presencia de hongos. Cmo se trata? Esta afeccin se trata con medicamentos. [place] pueden ser de venta libre o con receta. Estos medicamentos pueden administrarse de la siguiente manera:  Por boca (va oral).  Mediante aplicacin en la piel, en forma de crema o polvo. Siga estas indicaciones en su casa:  Tome o aplquese los medicamentos de venta libre y los recetados solamente como se lo haya indicado el  mdico.  Mantenga un peso saludable. Si necesita ayuda para bajar de peso, hable con el mdico.  Mantenga la piel limpia y [place].  Si tiene diabetes, mantenga bajo control el nivel de Dispensing optician.  Concurra a todas las visitas de control como se lo haya indicado el mdico. Esto es importante.   Comunquese con un mdico si:  Los sntomas desaparecen y luego vuelven a Arts administrator.  Los sntomas no mejoran con Dispensing optician.  Sus sntomas empeoran.  La erupcin se extiende.  Tiene fiebre o escalofros.  Aparecen nuevos sntomas.  Tiene una nueva zona de enrojecimiento o calor en la piel. Resumen  La infeccin por hongos en la piel es un trastorno en el que hay un desarrollo excesivo de unos hongos (cndida) que viven normalmente en la piel. La causa de la afeccin es un cambio en el equilibrio normal de los hongos y las bacterias que viven en la piel.  Tome o aplquese los medicamentos de venta libre y los recetados solamente como se lo haya indicado el mdico.  Mantenga la piel limpia y [place].  Comunquese con un mdico si los sntomas no mejoran con el tratamiento. Esta informacin no tiene Marine scientist el consejo del mdico. Asegrese de hacerle al mdico cualquier pregunta que tenga. Document Revised: 05/05/2018 Document Reviewed: 05/05/2018 Elsevier Patient Education  2021 [employer].

## 2021-01-06 NOTE — Progress Notes (Signed)
Patient ID: Meghan Reeves, female    DOB: 1981/06/01  MRN: [id number]  CC: Vaginal Infection   Subjective: Meghan Reeves is a 40 y.o. female who presents for vaginal infection.  1. VAGINAL DISCHARGE: LMP: November 22, 2020 Onset: vaginal discharge began 1 week ago Description: normal color Odor: yes Itching: yes Vaginal burning: yes  Dysuria: yes  Bleeding: no Pelvic pain: yes Back pain: no Fever: no Genital sores: yes  Rash: yes Dyspareunia: yes  GI Symptoms: no   Patient Active Problem List   Diagnosis Date Noted  . Status post laparoscopic cholecystectomy 05/14/2020  . Diastasis recti 04/25/2020  . Nexplanon in place 12/14/2019  . Migraine without aura and with status migrainosus, not intractable 12/08/2018  . Medication overuse headache 12/08/2018  . Tobacco use disorder, continuous 02/03/2017  . Encounter for smoking cessation counseling 02/03/2017  . Spanish speaking patient 10/01/2016     Current Outpatient Medications on File Prior to Visit  Medication Sig Dispense Refill  . amitriptyline (ELAVIL) 10 MG tablet Take 0.5 tablets (5 mg total) by mouth at bedtime. 45 tablet 3  . APPLE CIDER VINEGAR PO Take by mouth.    . metoCLOPramide (REGLAN) 10 MG tablet TAKE 1 TABLET (10 MG TOTAL) BY MOUTH 3 (THREE) TIMES DAILY AS NEEDED FOR NAUSEA (OR HEADACHE). 15 tablet 11  . rizatriptan (MAXALT) 10 MG tablet Take 1 tablet (10 mg total) by mouth as needed for migraine. May repeat in 2 hours if needed 10 tablet 11   No current facility-administered medications on file prior to visit.    Allergies  Allergen Reactions  . Topamax [Topiramate] Other (See Comments)    Blurry vision, numbness of lips    Social History   Socioeconomic History  . Marital status: Significant Other    Spouse name: Not on file  . Number of children: 1  . Years of education: Not on file  . Highest education level: 6th grade  Occupational History  . Not on file   Tobacco Use  . Smoking status: Former Smoker    Packs/day: 0.25    Years: 10.00    Pack years: 2.50    Types: Cigarettes  . Smokeless tobacco: Never Used  Vaping Use  . Vaping Use: Never used  Substance and Sexual Activity  . Alcohol use: No    Alcohol/week: 0.0 standard drinks  . Drug use: No  . Sexual activity: Yes    Birth control/protection: None  Other Topics Concern  . Not on file  Social History Narrative   ** Merged History Encounter **       Lives at home with boyfriend  Right handed Caffeine: 30 oz daily of coca cola   Social Determinants of Health   Financial Resource Strain: Not on file  Food Insecurity: Not on file  Transportation Needs: Not on file  Physical Activity: Not on file  Stress: Not on file  Social Connections: Not on file  Intimate Partner Violence: Not on file    Family History  Problem Relation Age of Onset  . Asthma Mother   . Migraines Sister     Past Surgical History:  Procedure Laterality Date  . APPENDECTOMY  09/2016  . CESAREAN SECTION N/A 09/07/2017   Procedure: CESAREAN SECTION;  Surgeon: Mora Bellman, MD;  Location: [place];  Service: Obstetrics;  Laterality: N/A;  . LAPAROSCOPIC APPENDECTOMY N/A 09/29/2016   Procedure: APPENDECTOMY LAPAROSCOPIC;  Surgeon: Greer Pickerel, MD;  Location: [facility];  Service: General;  Laterality: N/A;  . NO PAST SURGERIES    . VENTRAL HERNIA REPAIR N/A 04/29/2020   Procedure: HERNIA REPAIR VENTRAL ADULT, Open;  Surgeon: Ronny Bacon, MD;  Location: [facility];  Service: General;  Laterality: N/A;    ROS: Review of Systems Negative except as stated above  PHYSICAL EXAM: BP 127/82 (BP Location: Left Arm, Patient Position: Sitting)   Pulse 90   Wt 186 lb 12.8 oz (84.7 kg)   SpO2 98%   BMI 35.30 kg/m   Physical Exam Constitutional:      Appearance: She is obese.  HENT:     Head: Normocephalic and atraumatic.  Eyes:     Extraocular Movements: Extraocular movements intact.      Pupils: Pupils are equal, round, and reactive to light.  Cardiovascular:     Rate and Rhythm: Normal rate and regular rhythm.     Pulses: Normal pulses.     Heart sounds: Normal heart sounds.  Pulmonary:     Effort: Pulmonary effort is normal.     Breath sounds: Normal breath sounds.  Genitourinary:    Comments: Patient decline examination. Musculoskeletal:     Cervical back: Normal range of motion and neck supple.  Neurological:     General: No focal deficit present.     Mental Status: She is alert.  Psychiatric:        Mood and Affect: Mood normal.        Behavior: Behavior normal.    Results for orders placed or performed in visit on 01/06/21  POCT URINALYSIS DIP (CLINITEK)  Result Value Ref Range   Color, UA yellow yellow   Clarity, UA clear clear   Glucose, UA negative negative mg/dL   Bilirubin, UA negative negative   Ketones, POC UA negative negative mg/dL   Spec Grav, UA >=1.030 (A) 1.010 - 1.025   Blood, UA negative negative   pH, UA 6.0 5.0 - 8.0   POC PROTEIN,UA negative negative, trace   Urobilinogen, UA 0.2 0.2 or 1.0 E.U./dL   Nitrite, UA Negative Negative   Leukocytes, UA Negative Negative  POCT urine pregnancy  Result Value Ref Range   Preg Test, Ur Negative Negative     ASSESSMENT AND PLAN: 1. Vaginal discharge: - Urinalysis negative for urinary tract infection.   - Urine pregnancy negative. - Cervicovaginal self-swab to screen for chlamydia, gonorrhea, trichomonas, bacterial vaginosis, and yeast infection. Will call with results.  - Follow-up with primary provider as needed.  - Cervicovaginal ancillary only - POCT URINALYSIS DIP (CLINITEK) - POCT urine pregnancy  2. Language barrier: - [employer] participated during today's visit.  - Interpreter Name: Eustace Quail, ID#:  [id number]  Patient was given the opportunity to ask questions.  Patient verbalized understanding of the plan and was able to repeat key elements of the plan. Patient was  given clear instructions to go to Emergency Department or return to medical center if symptoms don't improve, worsen, or new problems develop.The patient verbalized understanding.   Orders Placed This Encounter  Procedures  . POCT URINALYSIS DIP (CLINITEK)  . POCT urine pregnancy     Requested Prescriptions    No prescriptions requested or ordered in this encounter    Lizzet Hendley Zachery Dauer, NP

## 2021-01-06 NOTE — Progress Notes (Signed)
Vaginal discharge Itching w/odor Menstrual cycle 1 week late

## 2021-01-07 LAB — CERVICOVAGINAL ANCILLARY ONLY
Bacterial Vaginitis (gardnerella): POSITIVE — AB
Candida Glabrata: NEGATIVE
Candida Vaginitis: POSITIVE — AB
Chlamydia: NEGATIVE
Comment: NEGATIVE
Comment: NEGATIVE
Comment: NEGATIVE
Comment: NEGATIVE
Comment: NEGATIVE
Comment: NORMAL
Neisseria Gonorrhea: NEGATIVE
Trichomonas: NEGATIVE

## 2021-01-09 DIAGNOSIS — B9689 Other specified bacterial agents as the cause of diseases classified elsewhere: Secondary | ICD-10-CM | POA: Insufficient documentation

## 2021-01-09 DIAGNOSIS — B3731 Acute candidiasis of vulva and vagina: Secondary | ICD-10-CM | POA: Insufficient documentation

## 2021-01-09 DIAGNOSIS — B373 Candidiasis of vulva and vagina: Secondary | ICD-10-CM | POA: Insufficient documentation

## 2021-01-09 MED ORDER — METRONIDAZOLE 500 MG PO TABS: 500.0000 mg | ORAL_TABLET | Freq: Two times a day (BID) | ORAL | 0 refills | Status: AC

## 2021-01-09 MED ORDER — FLUCONAZOLE 150 MG PO TABS: 150.0000 mg | ORAL_TABLET | Freq: Once | ORAL | 0 refills | Status: AC

## 2021-01-09 NOTE — Progress Notes (Signed)
Please confirm if patient is breastfeeding. If she is notify provider as the antibiotic Flagyl is not recommended with breastfeeding.

## 2021-01-09 NOTE — Addendum Note (Signed)
Addended by: Camillia Herter on: 01/09/2021 07:54 AM   Modules accepted: Orders

## 2021-01-09 NOTE — Progress Notes (Signed)
Please call patient with update.   Negative for chlamydia, gonorrhea, and trichomonas.  Your STI panel resulted positive for Candida Vaginitis which is better known as a yeast infection. You have been prescribed Fluconazole 150 mg (1 tablet) for a one time dose to treat this.  Vaginal swab positive for bacterial vaginosis, an overgrowth of normal bacteria in the vagina due to changes in pH often related to semen, menstrual periods, or soaps. I have prescribed Flagyl twice per day for 7 days. Do not drink alcohol while taking this medication.   Follow-up with primary provider as needed.

## 2021-01-17 ENCOUNTER — Encounter (HOSPITAL_COMMUNITY): Payer: Self-pay

## 2021-01-17 ENCOUNTER — Ambulatory Visit (HOSPITAL_COMMUNITY)
Admission: EM | Admit: 2021-01-17 | Discharge: 2021-01-17 | Disposition: A | Discharge status: Home / Self Care | Payer: Self-pay

## 2021-01-17 ENCOUNTER — Telehealth: Payer: Self-pay | Admitting: Internal Medicine

## 2021-01-17 ENCOUNTER — Other Ambulatory Visit: Payer: Self-pay

## 2021-01-17 DIAGNOSIS — L309 Dermatitis, unspecified: Secondary | ICD-10-CM

## 2021-01-17 MED ORDER — TRIAMCINOLONE ACETONIDE 0.1 % EX CREA
1.0000 | TOPICAL_CREAM | Freq: Two times a day (BID) | CUTANEOUS | 0 refills | Status: DC
Start: 2021-01-17 — End: 2022-01-21

## 2021-01-17 NOTE — Telephone Encounter (Signed)
PT states she misunderstood instructions at her last appt w. AMinette Brine and had been taking only 1tab, instead of 2. Pt wants to know if this will affect her in any way and if so what she needs to do. Pt Has 8 tablets left and is asking if she could get a refill sent to [facility] at [facility]. Pt asked if she could get a call back with the answer.  Please advise and thank you Medication: MetroNIDAZOLE (FLAGYL) 500 MG tablet [[id number]]

## 2021-01-17 NOTE — ED Triage Notes (Addendum)
Pt reports itchiness left ar x 8 days. States the itchiness is spreading to the forehead.   Pt reports "bone pain" after taking fluconazole.

## 2021-01-17 NOTE — Telephone Encounter (Signed)
Please route to Durene Fruits who last saw patient for this acute concern and prescribed medication.   Phill Myron, D.O. Primary Care at [facility]  01/17/2021, 11:43 AM

## 2021-01-19 NOTE — ED Provider Notes (Signed)
[place]    CSN: [id number] Arrival date & time: 01/17/21  1920      History   Chief Complaint Chief Complaint  Patient presents with  . Skin Problem    HPI Meghan Reeves is a 40 y.o. female.   Ago interpreter used today to facilitate visit with patient's consent.  Here today with itchy rash that started behind the left ear and is now extending to forehead the past week or so.  She denies new make-ups, hygiene products, foods, medications, fever, chills, headaches, visual changes, hearing changes.  Has been trying moisturizing cream without much relief so far.  No history of chronic dermatologic issues known.     Past Medical History:  Diagnosis Date  . Asthma, mild   . Medical history non-contributory     Patient Active Problem List   Diagnosis Date Noted  . Bacterial vaginosis 01/09/2021  . Candida vaginitis 01/09/2021  . Status post laparoscopic cholecystectomy 05/14/2020  . Diastasis recti 04/25/2020  . Nexplanon in place 12/14/2019  . Migraine without aura and with status migrainosus, not intractable 12/08/2018  . Medication overuse headache 12/08/2018  . Tobacco use disorder, continuous 02/03/2017  . Encounter for smoking cessation counseling 02/03/2017  . Spanish speaking patient 10/01/2016    Past Surgical History:  Procedure Laterality Date  . APPENDECTOMY  09/2016  . CESAREAN SECTION N/A 09/07/2017   Procedure: CESAREAN SECTION;  Surgeon: Mora Bellman, MD;  Location: [place];  Service: Obstetrics;  Laterality: N/A;  . LAPAROSCOPIC APPENDECTOMY N/A 09/29/2016   Procedure: APPENDECTOMY LAPAROSCOPIC;  Surgeon: Greer Pickerel, MD;  Location: [facility];  Service: General;  Laterality: N/A;  . NO PAST SURGERIES    . VENTRAL HERNIA REPAIR N/A 04/29/2020   Procedure: HERNIA REPAIR VENTRAL ADULT, Open;  Surgeon: Ronny Bacon, MD;  Location: [facility];  Service: General;  Laterality: N/A;    OB History    Gravida  2    Para  1   Term  1   Preterm  0   AB  1   Living  1     SAB  1   IAB  0   Ectopic  0   Multiple      Live Births  1            Home Medications    Prior to Admission medications   Medication Sig Start Date End Date Taking? Authorizing Provider  triamcinolone (KENALOG) 0.1 % Apply 1 application topically 2 (two) times daily. 01/17/21  Yes Volney American, PA-C  amitriptyline (ELAVIL) 10 MG tablet Take 0.5 tablets (5 mg total) by mouth at bedtime. 12/17/20   Lomax, Amy, NP  APPLE CIDER VINEGAR PO Take by mouth.    [provider]  fluconazole (DIFLUCAN) 150 MG tablet Take 150 mg by mouth once. 01/09/21   [provider]  metoCLOPramide (REGLAN) 10 MG tablet TAKE 1 TABLET (10 MG TOTAL) BY MOUTH 3 (THREE) TIMES DAILY AS NEEDED FOR NAUSEA (OR HEADACHE). 12/03/20   Lomax, Amy, NP  rizatriptan (MAXALT) 10 MG tablet Take 1 tablet (10 mg total) by mouth as needed for migraine. May repeat in 2 hours if needed 12/17/20   Debbora Presto, NP    Family History Family History  Problem Relation Age of Onset  . Asthma Mother   . Migraines Sister     Social History Social History   Tobacco Use  . Smoking status: Former Smoker    Packs/day: 0.25  Years: 10.00    Pack years: 2.50    Types: Cigarettes  . Smokeless tobacco: Never Used  Vaping Use  . Vaping Use: Never used  Substance Use Topics  . Alcohol use: No    Alcohol/week: 0.0 standard drinks  . Drug use: No     Allergies   Topamax [topiramate]   Review of Systems Review of Systems Per HPI  Physical Exam Triage Vital Signs ED Triage Vitals  Enc Vitals Group     BP 01/17/21 1931 110/71     Pulse Rate 01/17/21 1931 74     Resp 01/17/21 1931 17     Temp 01/17/21 1931 97.9 F (36.6 C)     Temp Source 01/17/21 1931 Oral     SpO2 01/17/21 1931 100 %     Weight --      Height --      Head Circumference --      Peak Flow --      Pain Score 01/17/21 1929 0     Pain Loc --      Pain Edu?  --      Excl. in [place]? --    No data found.  Updated Vital Signs BP 110/71 (BP Location: Right Arm)   Pulse 74   Temp 97.9 F (36.6 C) (Oral)   Resp 17   LMP 12/23/2020 (Exact Date)   SpO2 100%   Visual Acuity Right Eye Distance:   Left Eye Distance:   Bilateral Distance:    Right Eye Near:   Left Eye Near:    Bilateral Near:     Physical Exam Vitals and nursing note reviewed.  Constitutional:      Appearance: Normal appearance. She is not ill-appearing.  HENT:     Head: Atraumatic.  Eyes:     Extraocular Movements: Extraocular movements intact.     Conjunctiva/sclera: Conjunctivae normal.  Cardiovascular:     Rate and Rhythm: Normal rate and regular rhythm.     Heart sounds: Normal heart sounds.  Pulmonary:     Effort: Pulmonary effort is normal.     Breath sounds: Normal breath sounds.  Musculoskeletal:        General: Normal range of motion.     Cervical back: Normal range of motion and neck supple.  Skin:    General: Skin is warm and dry.     Comments: Erythematous papular rash across entire forehead and extending down lateral scalp behind left ear  Neurological:     Mental Status: She is alert and oriented to person, place, and time.  Psychiatric:        Mood and Affect: Mood normal.        Thought Content: Thought content normal.        Judgment: Judgment normal.      UC Treatments / Results  Labs (all labs ordered are listed, but only abnormal results are displayed) Labs Reviewed - No data to display  EKG   Radiology No results found.  Procedures Procedures (including critical care time)  Medications Ordered in UC Medications - No data to display  Initial Impression / Assessment and Plan / UC Course  I have reviewed the triage vital signs and the nursing notes.  Pertinent labs & imaging results that were available during my care of the patient were reviewed by me and considered in my medical decision making (see chart for details).      Appears potentially allergic, unknown trigger.  We will treat with  triamcinolone cream, good moisturization, hypoallergenic products only for hygiene and laundry.  Follow-up with primary care for recheck if not fully resolving.  Final Clinical Impressions(s) / UC Diagnoses   Final diagnoses:  Dermatitis   Discharge Instructions   None    ED Prescriptions    Medication Sig Dispense Auth. Provider   triamcinolone (KENALOG) 0.1 % Apply 1 application topically 2 (two) times daily. 60 g Volney American, [place]     PDMP not reviewed this encounter.   Volney American, [place] 01/19/21 1440

## 2021-03-07 NOTE — Telephone Encounter (Signed)
Patient should complete remaining Metronidazole (Flagyl) as prescribed. If symptoms do not improve and or worsen she may come in for nurse visit only repeat cervicovaginal testing and refill will be provided if indicated at that time.

## 2021-04-15 ENCOUNTER — Other Ambulatory Visit: Payer: Self-pay

## 2021-04-15 MED FILL — Rizatriptan Benzoate Tab 10 MG (Base Equivalent): ORAL | 5 days supply | Qty: 10 | Fill #0 | Status: AC

## 2021-04-15 MED FILL — Metoclopramide HCl Tab 10 MG (Base Equivalent): ORAL | 5 days supply | Qty: 15 | Fill #0 | Status: AC

## 2021-04-30 ENCOUNTER — Emergency Department (HOSPITAL_COMMUNITY)
Admission: EM | Admit: 2021-04-30 | Discharge: 2021-04-30 | Disposition: A | Discharge status: Home / Self Care | Payer: Self-pay | Attending: Emergency Medicine | Admitting: Emergency Medicine

## 2021-04-30 ENCOUNTER — Other Ambulatory Visit: Payer: Self-pay

## 2021-04-30 ENCOUNTER — Encounter (HOSPITAL_COMMUNITY): Payer: Self-pay

## 2021-04-30 ENCOUNTER — Emergency Department (HOSPITAL_COMMUNITY): Payer: Self-pay

## 2021-04-30 DIAGNOSIS — M5412 Radiculopathy, cervical region: Secondary | ICD-10-CM

## 2021-04-30 DIAGNOSIS — R202 Paresthesia of skin: Secondary | ICD-10-CM | POA: Insufficient documentation

## 2021-04-30 DIAGNOSIS — J45909 Unspecified asthma, uncomplicated: Secondary | ICD-10-CM | POA: Insufficient documentation

## 2021-04-30 DIAGNOSIS — Z87891 Personal history of nicotine dependence: Secondary | ICD-10-CM | POA: Insufficient documentation

## 2021-04-30 DIAGNOSIS — R531 Weakness: Secondary | ICD-10-CM | POA: Insufficient documentation

## 2021-04-30 DIAGNOSIS — M542 Cervicalgia: Secondary | ICD-10-CM | POA: Insufficient documentation

## 2021-04-30 DIAGNOSIS — M79609 Pain in unspecified limb: Secondary | ICD-10-CM

## 2021-04-30 MED ORDER — NAPROXEN 500 MG PO TABS
500.0000 mg | ORAL_TABLET | Freq: Two times a day (BID) | ORAL | 0 refills | Status: DC
Start: 2021-04-30 — End: 2022-01-21

## 2021-04-30 NOTE — ED Triage Notes (Signed)
Pt reports RT arm pain and tingling that radiates to neck. Pt states she has been carrying a heavy water bottle recently and she thinks it is her tendons. She also reports RT cheek tingling. No slurred speech, no hx of stroke.

## 2021-04-30 NOTE — Discharge Instructions (Addendum)
Take Naproxen as prescribed for pain. Cool compresses to the neck for additional comfort. (Tome naproxeno segn lo recetado para Conservation officer, historic buildings. Compresas fras en el cuello para mayor comodidad.)  Follow up with your doctor for recheck in 3-4 days for recheck. ( Haga un seguimiento con su mdico en 3 a 4 das para volver a Health visitor.)

## 2021-04-30 NOTE — ED Provider Notes (Signed)
[facility] EMERGENCY DEPARTMENT Provider Note   CSN: [id number] Arrival date & time: 04/30/21  0041     History Chief Complaint  Patient presents with   Arm Injury    Meghan Reeves is a 40 y.o. female.  Patient to ED for evaluation of weakness and numbness of the right arm for the past several days. The pain starts in her neck and involves the entire arm to the wrist. No known injury or previous neck problems. She is right hand dominant. She has tried taking ibuprofen for pain without relief. She is able to write and hold objects without dropping them. No headache, lower extremity symptoms. She reports tingling in the right jaw as well.   The history is provided by the patient. A language interpreter was used.  Arm Injury Associated symptoms: neck pain   Associated symptoms: no fever       Past Medical History:  Diagnosis Date   Asthma, mild    Medical history non-contributory     Patient Active Problem List   Diagnosis Date Noted   Bacterial vaginosis 01/09/2021   Candida vaginitis 01/09/2021   Status post laparoscopic cholecystectomy 05/14/2020   Diastasis recti 04/25/2020   Nexplanon in place 12/14/2019   Migraine without aura and with status migrainosus, not intractable 12/08/2018   Medication overuse headache 12/08/2018   Tobacco use disorder, continuous 02/03/2017   Encounter for smoking cessation counseling 02/03/2017   Spanish speaking patient 10/01/2016    Past Surgical History:  Procedure Laterality Date   APPENDECTOMY  09/2016   CESAREAN SECTION N/A 09/07/2017   Procedure: CESAREAN SECTION;  Surgeon: Mora Bellman, MD;  Location: [facility];  Service: Obstetrics;  Laterality: N/A;   LAPAROSCOPIC APPENDECTOMY N/A 09/29/2016   Procedure: APPENDECTOMY LAPAROSCOPIC;  Surgeon: Greer Pickerel, MD;  Location: [facility];  Service: General;  Laterality: N/A;   NO PAST SURGERIES     VENTRAL HERNIA REPAIR N/A 04/29/2020    Procedure: HERNIA REPAIR VENTRAL ADULT, Open;  Surgeon: Ronny Bacon, MD;  Location: [facility];  Service: General;  Laterality: N/A;     OB History     Gravida  2   Para  1   Term  1   Preterm  0   AB  1   Living  1      SAB  1   IAB  0   Ectopic  0   Multiple      Live Births  1           Family History  Problem Relation Age of Onset   Asthma Mother    Migraines Sister     Social History   Tobacco Use   Smoking status: Former    Packs/day: 0.25    Years: 10.00    Pack years: 2.50    Types: Cigarettes   Smokeless tobacco: Never  Vaping Use   Vaping Use: Never used  Substance Use Topics   Alcohol use: No    Alcohol/week: 0.0 standard drinks   Drug use: No    Home Medications Prior to Admission medications   Medication Sig Start Date End Date Taking? Authorizing Provider  amitriptyline (ELAVIL) 10 MG tablet Take 0.5 tablets (5 mg total) by mouth at bedtime. 12/17/20   Lomax, Amy, NP  APPLE CIDER VINEGAR PO Take by mouth.    [provider]  fluconazole (DIFLUCAN) 150 MG tablet Take 150 mg by mouth once. 01/09/21   [provider]  metoCLOPramide (REGLAN) 10 MG tablet TAKE 1 TABLET (10 MG TOTAL) BY MOUTH 3 (THREE) TIMES DAILY AS NEEDED FOR NAUSEA (OR HEADACHE). 12/03/20   Lomax, Amy, NP  metoCLOPramide (REGLAN) 10 MG tablet TAKE 1 TABLET (10 MG TOTAL) BY MOUTH 3 (THREE) TIMES DAILY AS NEEDED FOR NAUSEA (OR HEADACHE). 12/03/20 12/03/21  Lomax, Amy, NP  rizatriptan (MAXALT) 10 MG tablet Take 1 tablet (10 mg total) by mouth as needed for migraine. May repeat in 2 hours if needed 12/17/20   Lomax, Amy, NP  rizatriptan (MAXALT) 10 MG tablet TAKE 1 TABLET (10 MG TOTAL) BY MOUTH AS NEEDED FOR MIGRAINE. MAY REPEAT IN 2 HOURS IF NEEDED 11/29/20 11/29/21  Melvenia Beam, MD  triamcinolone (KENALOG) 0.1 % Apply 1 application topically 2 (two) times daily. 01/17/21   Volney American, PA-C    Allergies    Topamax [topiramate]  Review of  Systems   Review of Systems  Constitutional:  Negative for fever.  HENT:  Negative for facial swelling.   Musculoskeletal:  Positive for neck pain.       See HPI.  Skin:  Negative for color change.  Neurological:  Positive for weakness and numbness.       See HPI.   Physical Exam Updated Vital Signs BP 117/76 (BP Location: Left Arm)   Pulse 62   Temp 98.8 F (37.1 C) (Oral)   Resp 15   Ht 5\' 1"  (1.549 m)   Wt 87.1 kg   LMP  (LMP Unknown)   SpO2 99%   BMI 36.28 kg/m   Physical Exam Vitals and nursing note reviewed.  Constitutional:      General: She is not in acute distress.    Appearance: She is well-developed. She is not ill-appearing.  Cardiovascular:     Pulses: Normal pulses.  Pulmonary:     Effort: Pulmonary effort is normal.  Musculoskeletal:        General: No swelling. Normal range of motion.     Cervical back: Normal range of motion.     Comments: Right upper extremity is not swelling. No discoloration. FROM. No strength deficits. There is tenderness to the right paracervical spine without swelling. No midline tenderness.   Skin:    General: Skin is warm and dry.  Neurological:     Mental Status: She is alert and oriented to person, place, and time.    ED Results / Procedures / Treatments   Labs (all labs ordered are listed, but only abnormal results are displayed) Labs Reviewed - No data to display  EKG None  Radiology CT Head Wo Contrast  Result Date: 04/30/2021 CLINICAL DATA:  Neurologic deficit. Acute. Right arm pain and tingling radiating to neck. EXAM: CT HEAD WITHOUT CONTRAST TECHNIQUE: Contiguous axial images were obtained from the base of the skull through the vertex without intravenous contrast. COMPARISON:  09/22/2018 FINDINGS: Brain: No evidence of acute infarction, hemorrhage, hydrocephalus, extra-axial collection or mass lesion/mass effect. Vascular: No hyperdense vessel or unexpected calcification. Skull: Normal. Negative for fracture or  focal lesion. Sinuses/Orbits: Mild mucosal thickening involving the maxillary sinuses and sphenoid sinus. No sinus fluid levels. Mastoid air cells appear clear Other: None. IMPRESSION: 1. No acute intracranial abnormalities. Normal brain. 2. Mild sinus inflammation. Electronically Signed   By: Kerby Moors M.D.   On: 04/30/2021 03:10    Procedures Procedures   Medications Ordered in ED Medications - No data to display  ED Course  I have reviewed the triage vital signs and  the nursing notes.  Pertinent labs & imaging results that were available during my care of the patient were reviewed by me and considered in my medical decision making (see chart for details).    MDM Rules/Calculators/A&P                          The patient reports right UE and neck pain, with tingling/numbness and weakness of the arm. Also reports right facial tingling. No facial droop.   Discussed right arm symptoms likely originating in the neck. She expresses concern for stroke. No neurologic deficits on exam. Will obtain head CT given patient's concerns.   Head CT negative. Patient reassured. Will Rx Naproxen, encourage PCP follow up for further evaluation.   Final Clinical Impression(s) / ED Diagnoses Final diagnoses:  None   Paresthesias  Rx / DC Orders ED Discharge Orders     None        Charlann Lange, PA-C 04/30/21 0423    Orpah Greek, MD 04/30/21 [phone]

## 2021-10-03 ENCOUNTER — Other Ambulatory Visit (HOSPITAL_COMMUNITY): Payer: Self-pay

## 2022-01-21 ENCOUNTER — Other Ambulatory Visit: Payer: Self-pay

## 2022-01-21 ENCOUNTER — Ambulatory Visit: Payer: Self-pay | Admitting: Family Medicine

## 2022-01-21 ENCOUNTER — Encounter: Payer: Self-pay | Admitting: Family Medicine

## 2022-01-21 VITALS — BP 128/75 | HR 78 | Ht 61.0 in | Wt 186.5 lb

## 2022-01-21 DIAGNOSIS — G43001 Migraine without aura, not intractable, with status migrainosus: Secondary | ICD-10-CM

## 2022-01-21 MED ORDER — METOCLOPRAMIDE HCL 10 MG PO TABS: ORAL_TABLET | ORAL | 11 refills | Status: DC

## 2022-01-21 MED ORDER — RIZATRIPTAN BENZOATE 10 MG PO TABS
10.0000 mg | ORAL_TABLET | ORAL | 11 refills | Status: DC | PRN
  Filled 2022-02-20: qty 10, 30d supply, fill #0

## 2022-01-21 NOTE — Progress Notes (Signed)
? ? ?PATIENT: Meghan Reeves ?DOB: 1981-10-11 ? ?REASON FOR VISIT: follow up ?HISTORY FROM: patient ? ?Chief Complaint  ?Patient presents with  ? Follow-up  ?  RM 1 w/ interpreter/[place]. Pt states she stopped taking amitriptyline and no migraine in over a year. However, a week ago, migraines came back. Having daily intermittent migraines. She is interested in restarting amitriptyline.   ?  ? ?HISTORY OF PRESENT ILLNESS: ? ?01/21/22 ALL:  ?Meghan Reeves returns for follow up for migraines. She reports stopping amitriptyline about a year ago. Migraines have remained well managed. She has used rizatriptan on rare occasions and reports it works well with metoclopramide. She may have had 1 migraine per month on average. She does note that amitriptyline made her sleepy. She has had some ear pressure over the past week.  ? ?12/17/2020 ALL:  ?She returns for follow up on migraine headaches. She reported that migraines had improved significantly on amitriptyline at last visit in 06/2020. She wanted to wean medication and decreased dose to '5mg'$ . She continued to do well and discontinued amitriptyline shortly after being seen. She was doing ok with rare headaches until coming down with a cold mid December. Headaches were near daily and more severe for about 2 weeks. After getting better, headaches resolved. Over the past month, headaches are occurring intermittently. She reports having to take rizatriptan 2 times over the past two weeks. She is concerned that headaches may become more frequent. She felt that amitriptyline '5mg'$  was well tolerated and didn't cause significant sleepiness. '10mg'$  did cause sleepiness.  ? ?Meghan Reeves assists with interpreting.  ? ?06/11/2020 ALL:  ?Meghan Reeves is a 41 y.o. female here today for follow up. She has continues amitriptyline '10mg'$  at bedtime. She feels that her headaches are significantly better. She does not have regular headaches any longer. She does also note that she has  intermittent "heaviness" of her head and feels foggy at times. Otherwise, she is doing well. Rizatriptan works well to abort migraines. She has noted intermittent red eye as well. Eye is not painful but is watery. She has not seen PCP. No vision changes.  ? ? ?HISTORY: (copied from my note on 12/13/2019) ? ?Meghan Reeves is a 41 y.o. female here today for follow up for migraines. She is doing well on Ajovy. She is having about 3-4 migraines per month. Unfortunately, she is uninsured and is unable to continue Ajovy. She has been receiving samples for the past year. She has one injection left. We have tried to help her with [place] assistance program as well as patient assistance with Ajovy. She is not a [place] citizen and does not qualify for assistance programs. She is asking to start another medication that is more affordable. She has tried topiramate in the past that caused worsening anxiety.  ?  ?[place], [place] interpreter, assists with visit today  ?  ?HISTORY: (copied from my note on 07/10/2019) ?  ?Meghan Reeves is a 41 y.o. female here today for follow up for migraines.  She is here with an interpreter who aids in obtaining history.  She has continued Ajovy injections monthly.  She does feel that these injections have helped some with reducing the intensity of her headaches.  She was given cyclobenzaprine at last visit.  She does feel that this helps with muscle tension.  She continues to have daily headaches. She reports pressure across her forehead. She has a throbbing sensation in the back of her head.  She does  note runny nose, sore throat and body aches when she has worsening headaches.  She has not been treated for seasonal allergies in the past.  She drinks 2 bottles of water daily.  She does take ibuprofen at least 3-4 times weekly.  She has taken rizatriptan once but is not sure it helped. ?  ?HISTORY: (copied from Dr Cathren Laine note on 06/07/2019) ?  ?Interval history  06/07/2019:  The headaches are better. Here with interpreter. Not as strong a little better. She takes Tylenol for back pain. She stopped the Fioricet. She has 4-5 headache days a month. Will help with [employer] assistance. Will give samples of Ajovy for 6 months until then and try to get it approved through [place] assistance as well. She has tension in the back of the head, tightness.  ?  ?HPI:  Meghan Reeves is a 41 y.o. female here as requested by Emergency Room for headaches. She reports that she has headaches everyday. Headaches started several months ago. Milder headaches are described as a pressure sensation across the top of her head. Pressure is throbbing. Rated 5-6/10. She is nauseated at times with mild headaches. She has more severe headaches about 1-2 times a week. She describes these headaches as more unilateral (side varies). She feels a pounding sensation. Rated 7-8/10. These headaches are associated with sound and light sensitivity. She has a vision disturbance described as a white veil that she can see on the same side she is having headache. She is nauseated with headaches but does not vomit. She denies dizziness or syncope with headaches. She feels that stress is a trigger. She has not noticed any alleviating factors with exception of rest. She has noticed that headaches last all day if not treated. She is taking Fioricet twice daily most days (occasionally once daily.) She was given prescription for 90 tablets of Fioricet on 12/11 and has about 7-8 tablets left. She was given Topamax at one UC visit that caused numbness of her lip and she saw flashy lights. She took this for about a week then stopped. Here with interpreter. No other focal neurologic deficits, associated symptoms, inciting events or modifiable factors. ?  ?She does have trouble going to sleep. She may fall asleep around 2-3am and gets up around 9am. She does not snore. She rarely wakes up with a  headache. She denies dry mouth in the mornings.  ?  ?She is taking Lexapro daily for anxiety. She feels that this medication is helping some but she feels sleepy during the day.  ?  ?She had a normal CT of head in 09/2018. She has never had an MRI. She does not drink or smoke. She has a sister with migraines that is treated with Advil.  ?  ?Reviewed notes, labs and imaging from outside physicians, which showed: Patient was referred from the emergency room.  She was seen at the emergency room in November 2019.  She is also seen Meghan Reeves as well in the office earlier this month.  She reported having lots of headaches for the last couple months.  She had 3 ER visits into urgent care visits for the same.  She was taking Advil migraine on a daily basis.  She was tried on Topamax for prevention 100 mg at bedtime and she had side effects including anxiety. ?  ?Reviewed office notes from Dr. Kenton Kingfisher.  Patient was seen in the emergency room multiple times.  She is been taking a combination of pseudoephedrine and  antihistamine and feels headache improved.  She reports headaches occur almost daily.  Localized to the left side of the head occasionally is like a tension sensation across the bilateral head.  Try Topamax did not feel the medication work and discontinued it.  Endorsed nausea and light sensitivity when headaches occur.  She was given Fioricet 90 capsules. ? ? ?REVIEW OF SYSTEMS: Out of a complete 14 system review of symptoms, the patient complains only of the following symptoms, headaches, red, watery eye and all other reviewed systems are negative. ? ?ALLERGIES: ?Allergies  ?Allergen Reactions  ? Topamax [Topiramate] Other (See Comments)  ?  Blurry vision, numbness of lips  ? ? ?HOME MEDICATIONS: ?Outpatient Medications Prior to Visit  ?Medication Sig Dispense Refill  ? amitriptyline (ELAVIL) 10 MG tablet Take 0.5 tablets (5 mg total) by mouth at bedtime. 45 tablet 3  ? APPLE CIDER VINEGAR PO Take by mouth.     ? fluconazole (DIFLUCAN) 150 MG tablet Take 150 mg by mouth once.    ? metoCLOPramide (REGLAN) 10 MG tablet TAKE 1 TABLET (10 MG TOTAL) BY MOUTH 3 (THREE) TIMES DAILY AS NEEDED FOR NAUSEA (OR HEADACHE). 15 tablet 11

## 2022-01-21 NOTE — Patient Instructions (Signed)
Below is our plan: ? ?We will continue rizatriptan '10mg'$  and metoclopramide '10mg'$  as needed for headaches. If you feel that headaches do not improve please let me know. We can add amitriptyline in the future if headache frequency worsens.  ? ?Please make sure you are staying well hydrated. I recommend 50-60 ounces daily. Well balanced diet and regular exercise encouraged. Consistent sleep schedule with 6-8 hours recommended.  ? ?Please continue follow up with care team as directed.  ? ?Follow up with me as needed ? ?You may receive a survey regarding today's visit. I encourage you to leave honest feed back as I do use this information to improve patient care. Thank you for seeing me today!  ? ? ?

## 2022-02-17 ENCOUNTER — Ambulatory Visit: Payer: Self-pay

## 2022-02-17 NOTE — Telephone Encounter (Signed)
Interpreter Johnathan Hausen #[id number] ? ?Chief Complaint: lump ?Symptoms: neck lump on L side, swelling and painful to touch ?Frequency: 1 year and gotten worse ?Pertinent Negatives: Patient denies itching ?Disposition: '[]'$ ED /'[]'$ Urgent Care (no appt availability in office) / '[x]'$ Appointment(In office/virtual)/ '[]'$  [facility]/ '[]'$ Home Care/ '[]'$ Refused Recommended Disposition /'[]'$ [facility]/ '[]'$  Follow-up with PCP ?Additional Notes: Pt was scheduled at [facility] for first available on 02/19/22 at 1520.  ? ?Reason for Disposition ? [1] Swelling is painful to touch AND [2] no fever ? ?Answer Assessment - Initial Assessment Questions ?1. APPEARANCE of SWELLING: "What does it look like?" (e.g., lymph node, insect bite, mole) ?    Boil looking  ?2. SIZE: "How large is the swelling?" (e.g., inches, cm; or compare to size of pinhead, tip of pen, eraser, coin, pea, grape, ping pong ball)  ?    Fingertip width  ?3. LOCATION: "Where is the swelling located?" ?    L Neck  ?4. ONSET: "When did the swelling start?" ?    1 year gotten worse ?5. PAIN: "Is it painful?" If Yes, ask: "How much?" ?    Yes 6/7 when touching ?6. ITCH: "Does it itch?" If Yes, ask: "How much?" ?    0 ?8. OTHER SYMPTOMS: "Do you have any other symptoms?" (e.g., fever) ?    no ? ?Protocols used: Skin Lump or Localized Swelling-A-AH ? ?

## 2022-02-19 ENCOUNTER — Other Ambulatory Visit: Payer: Self-pay

## 2022-02-19 ENCOUNTER — Encounter: Payer: Self-pay | Admitting: Physician Assistant

## 2022-02-19 ENCOUNTER — Other Ambulatory Visit (HOSPITAL_COMMUNITY)
Admission: RE | Admit: 2022-02-19 | Discharge: 2022-02-19 | Disposition: A | Discharge status: Home / Self Care | Payer: Self-pay | Source: Ambulatory Visit | Attending: Physician Assistant | Admitting: Physician Assistant

## 2022-02-19 ENCOUNTER — Ambulatory Visit (INDEPENDENT_AMBULATORY_CARE_PROVIDER_SITE_OTHER): Payer: Self-pay | Admitting: Physician Assistant

## 2022-02-19 VITALS — BP 112/77 | HR 90 | Temp 98.2°F | Resp 18 | Ht 61.0 in | Wt 186.0 lb

## 2022-02-19 DIAGNOSIS — R221 Localized swelling, mass and lump, neck: Secondary | ICD-10-CM

## 2022-02-19 DIAGNOSIS — N760 Acute vaginitis: Secondary | ICD-10-CM

## 2022-02-19 DIAGNOSIS — N898 Other specified noninflammatory disorders of vagina: Secondary | ICD-10-CM

## 2022-02-19 DIAGNOSIS — B9689 Other specified bacterial agents as the cause of diseases classified elsewhere: Secondary | ICD-10-CM

## 2022-02-19 DIAGNOSIS — B3731 Acute candidiasis of vulva and vagina: Secondary | ICD-10-CM

## 2022-02-19 MED ORDER — METRONIDAZOLE 500 MG PO TABS
500.0000 mg | ORAL_TABLET | Freq: Two times a day (BID) | ORAL | 0 refills | Status: AC
  Filled 2022-02-19: qty 14, 7d supply, fill #0

## 2022-02-19 MED ORDER — FLUCONAZOLE 150 MG PO TABS
150.0000 mg | ORAL_TABLET | Freq: Once | ORAL | 0 refills | Status: AC
  Filled 2022-02-19: qty 1, 1d supply, fill #0

## 2022-02-19 NOTE — Patient Instructions (Signed)
To help with your vaginal itching and odor, you are going to take metronidazole twice daily for 7 days, and Diflucan once. ? ?We are going to order an ultrasound to be completed to further evaluate the nodules on your neck. ? ?Please follow-up next week for your desired  physical along with fasting labs. ? ?Kennieth Rad, PA-C ?Physician Assistant ?[place] ?[email or web address] ? ? ?Infecci?n mic?tica vaginal en mujeres adultas ?Vaginal Yeast Infection, Adult ?La infecci?n mic?tica vaginal es una afecci?n que causa secreci?n vaginal y tambi?n dolor, hinchaz?n y enrojecimiento (inflamaci?n) de la vagina. Esta es una afecci?n frecuente. Algunas mujeres contraen esta infecci?n con frecuencia. ??Cu?les son las causas? ?La causa de la infecci?n es un cambio en el equilibrio normal de las levaduras (c?ndida) y las bacterias normales que viven en la vagina. Esta alteraci?n deriva en el crecimiento excesivo de las levaduras, lo que causa la inflamaci?n. ??Qu? [place]? ?Es m?s probable que esta afecci?n ocurra en las mujeres que: ?Toman antibi?ticos. ?Tienen diabetes. ?Toman anticonceptivos orales. ?Est?n embarazadas. ?Se hacen duchas vaginales con frecuencia. ?Tienen debilitado el sistema de defensa del organismo (sistema inmunitario). ?Han estado tomando medicamentos con corticoesteroides durante [employer]. ?Usan ropa ajustada con frecuencia. ??Cu?les son los signos o s?ntomas? ?Los s?ntomas de esta afecci?n incluyen: ?Secreci?n vaginal blanca, cremosa y espesa. ?Hinchaz?n, picaz?n, enrojecimiento e irritaci?n de la vagina. Los labios de la vagina (labios de la vulva) tambi?n pueden infectarse. ?Dolor o ardor al Garment/textile technologist. ?[place]. ??C?mo se diagnostica? ?Esta afecci?n se diagnostica en funci?n de lo siguiente: ?Sus antecedentes m?dicos. ?Un examen f?sico. ?Un examen p?lvico. El m?dico examinar? Truddie Coco de la  secreci?n vaginal con un microscopio. Probablemente el m?dico env?e esta muestra al laboratorio para analizarla y confirmar el diagn?stico. ??C?mo se trata? ?Esta afecci?n se trata con medicamentos. [employer] pueden ser recetados o de venta libre. Podr?n indicarle que use uno o m?s de lo siguiente: ?Medicamentos que se toman por boca (orales). ?Medicamentos que se aplican como una crema (t?picos). ?Medicamentos que se colocan directamente en la vagina (?vulos vaginales). ?Siga estas indicaciones en su casa: ?Use o apl?quese los medicamentos de venta libre y los recetados solamente como se lo haya indicado el m?dico. ?No use tampones hasta que el m?dico la autorice. ?No tenga sexo hasta que la infecci?n haya desaparecido. El sexo puede prolongar o empeorar sus s?ntomas de infecci?n. Preg?ntele al m?dico cu?ndo es seguro reanudar la actividad sexual. ?Concurra a [place]. Esto es importante. ??C?mo se evita? ? ?No use ropa ajustada, como pantimedias o pantalones ajustados. ?Use ropa interior de algod?n, que permite el paso del aire. ?No use duchas vaginales, jab?n perfumado, cremas ni talcos. ?Cuando vaya al ba?o, siempre higien?cese de adelante hacia atr?s. ?Si tiene diabetes, mantenga bajo control los niveles de Paramedic. ?Preg?ntele al m?dico otras maneras de prevenir las infecciones por levaduras. ?Comun?quese con un m?dico si: ?Tiene fiebre. ?Los s?ntomas desaparecen y luego vuelven a Arts administrator. ?Los s?ntomas no mejoran con Dispensing optician. ?Sus s?ntomas empeoran. ?Aparecen nuevos s?ntomas. ?Aparecen ampollas alrededor o adentro de la vagina. ?[employer] features editor de la vagina y no est? menstruando. ?Siente dolor en el abdomen. ?Resumen ?La infecci?n mic?tica vaginal es una afecci?n que causa secreci?n y tambi?n dolor, hinchaz?n y enrojecimiento (inflamaci?n) de la vagina. ?Esta afecci?n se trata con medicamentos. [employer] pueden ser recetados o de venta libre. ?Use o  apl?quese los medicamentos de venta libre y los recetados solamente como  se lo haya indicado el m?dico. ?No se haga duchas vaginales. Reanude la actividad sexual u use tampones como se lo haya indicado el m?dico. ?Comun?quese con un m?dico si los s?ntomas no mejoran con el tratamiento o si los s?ntomas desaparecen y [employer]. ?Esta informaci?n no tiene Marine scientist el consejo del m?dico. Aseg?rese de hacerle al m?dico cualquier pregunta que tenga. ?Document Revised: 02/11/2021 Document Reviewed: 02/11/2021 ?Elsevier Patient Education ? [place]. ? ?

## 2022-02-19 NOTE — Progress Notes (Signed)
? ?Established Patient Office Visit ? ?Subjective:  ?Patient ID: Meghan Reeves, female    DOB: Jul 17, 1981  Age: 41 y.o. MRN: [id number] ? ?CC:  ?Chief Complaint  ?Patient presents with  ? Cyst  ?  L side of neck  ? ? ?HPI ?Meghan Reeves presents with several complaints.  States that she has had a lump on the left side of her neck, states it has been present for the past year but has been getting worse over the past few weeks.  Describes it as tender, denies any discharge.  States that she has not tried anything for relief. ? ?States that she has been having vaginal discharge and itching for the past week, descrbes the discharge as thin and "just a little", states it is more the foul odor.  States that she also has been having vaginal itching.  States this has been going on for the same amount of time.  States that she has also been having suprapubic discomfort for the past week as well.  Denies dysuria, nausea, vomiting, fever, back pain.  Has not tried anything for relief.   ? ?Due to language barrier, an interpreter was present during the history-taking and subsequent discussion (and for part of the physical exam) with this patient. ? ? ? ?Past Medical History:  ?Diagnosis Date  ? Asthma, mild   ? Medical history non-contributory   ? ? ?Past Surgical History:  ?Procedure Laterality Date  ? APPENDECTOMY  09/2016  ? CESAREAN SECTION N/A 09/07/2017  ? Procedure: CESAREAN SECTION;  Surgeon: Mora Bellman, MD;  Location: [facility];  Service: Obstetrics;  Laterality: N/A;  ? LAPAROSCOPIC APPENDECTOMY N/A 09/29/2016  ? Procedure: APPENDECTOMY LAPAROSCOPIC;  Surgeon: Greer Pickerel, MD;  Location: [facility];  Service: General;  Laterality: N/A;  ? NO PAST SURGERIES    ? VENTRAL HERNIA REPAIR N/A 04/29/2020  ? Procedure: HERNIA REPAIR VENTRAL ADULT, Open;  Surgeon: Ronny Bacon, MD;  Location: [facility];  Service: General;  Laterality: N/A;  ? ? ?Family History  ?Problem Relation Age of  Onset  ? Asthma Mother   ? Migraines Sister   ? ? ?Social History  ? ?Socioeconomic History  ? Marital status: Significant Other  ?  Spouse name: Not on file  ? Number of children: 1  ? Years of education: Not on file  ? Highest education level: 6th grade  ?Occupational History  ? Not on file  ?Tobacco Use  ? Smoking status: Former  ?  Packs/day: 0.25  ?  Years: 10.00  ?  Pack years: 2.50  ?  Types: Cigarettes  ? Smokeless tobacco: Never  ?Vaping Use  ? Vaping Use: Never used  ?Substance and Sexual Activity  ? Alcohol use: No  ?  Alcohol/week: 0.0 standard drinks  ? Drug use: No  ? Sexual activity: Yes  ?  Birth control/protection: None  ?Other Topics Concern  ? Not on file  ?Social History Narrative  ? ** Merged History Encounter **  ?    ? Lives at home with boyfriend  ?Right handed ?Caffeine: 30 oz daily of coca cola  ? ?Social Determinants of Health  ? ?Financial Resource Strain: Not on file  ?Food Insecurity: Not on file  ?Transportation Needs: Not on file  ?Physical Activity: Not on file  ?Stress: Not on file  ?Social Connections: Not on file  ?Intimate Partner Violence: Not on file  ? ? ?Outpatient Medications Prior to Visit  ?Medication Sig Dispense Refill  ?  metoCLOPramide (REGLAN) 10 MG tablet TAKE 1 TABLET (10 MG TOTAL) BY MOUTH 3 (THREE) TIMES DAILY AS NEEDED FOR NAUSEA (OR HEADACHE). (Patient not taking: Reported on 02/19/2022) 15 tablet 11  ? rizatriptan (MAXALT) 10 MG tablet Take 1 tablet (10 mg total) by mouth as needed for migraine. May repeat in 2 hours if needed (Patient not taking: Reported on 02/19/2022) 10 tablet 11  ? ?No facility-administered medications prior to visit.  ? ? ?Allergies  ?Allergen Reactions  ? Topamax [Topiramate] Other (See Comments)  ?  Blurry vision, numbness of lips  ? ? ?ROS ?Review of Systems  ?Constitutional:  Negative for chills and fever.  ?HENT: Negative.    ?Eyes: Negative.   ?Respiratory:  Negative for shortness of breath.   ?Cardiovascular:  Negative for chest pain.   ?Gastrointestinal:  Negative for abdominal pain, diarrhea, nausea and vomiting.  ?Endocrine: Negative.   ?Genitourinary:  Positive for vaginal discharge. Negative for dysuria, frequency, genital sores, hematuria, pelvic pain and urgency.  ?Musculoskeletal:  Negative for back pain.  ?Skin: Negative.   ?Allergic/Immunologic: Negative.   ?Neurological: Negative.   ?Hematological: Negative.   ?Psychiatric/Behavioral: Negative.    ? ?  ?Objective:  ?  ?Physical Exam ?Vitals and nursing note reviewed.  ?Constitutional:   ?   Appearance: Normal appearance.  ?HENT:  ?   Head: Normocephalic and atraumatic.  ?   Right Ear: External ear normal.  ?   Left Ear: External ear normal.  ?   Nose: Nose normal.  ?   Mouth/Throat:  ?   Mouth: Mucous membranes are moist.  ?   Pharynx: Oropharynx is clear.  ?Eyes:  ?   Extraocular Movements: Extraocular movements intact.  ?   Conjunctiva/sclera: Conjunctivae normal.  ?   Pupils: Pupils are equal, round, and reactive to light.  ?Cardiovascular:  ?   Rate and Rhythm: Normal rate and regular rhythm.  ?   Pulses: Normal pulses.  ?   Heart sounds: Normal heart sounds.  ?Pulmonary:  ?   Effort: Pulmonary effort is normal.  ?   Breath sounds: Normal breath sounds.  ?Musculoskeletal:     ?   General: Normal range of motion.  ?   Cervical back: Normal range of motion and neck supple.  ?Lymphadenopathy:  ?   Cervical: Cervical adenopathy present.  ?   Left cervical: Deep cervical adenopathy present.  ?Skin: ?   General: Skin is warm and dry.  ?Neurological:  ?   General: No focal deficit present.  ?   Mental Status: She is alert and oriented to person, place, and time.  ?Psychiatric:     ?   Mood and Affect: Mood normal.     ?   Behavior: Behavior normal.     ?   Thought Content: Thought content normal.     ?   Judgment: Judgment normal.  ? ? ?BP 112/77 (BP Location: Left Arm, Patient Position: Sitting, Cuff Size: Normal)   Pulse 90   Temp 98.2 ?F (36.8 ?C) (Oral)   Resp 18   Ht '5\' 1"'$  (1.549  m)   Wt 186 lb (84.4 kg)   LMP 01/26/2022   SpO2 96%   BMI 35.14 kg/m?  ?Wt Readings from Last 3 Encounters:  ?02/19/22 186 lb (84.4 kg)  ?01/21/22 186 lb 8 oz (84.6 kg)  ?04/30/21 192 lb (87.1 kg)  ? ? ? ?Health Maintenance Due  ?Topic Date Due  ? COVID-19 Vaccine (1) Never done  ? Hepatitis  C Screening  Never done  ? ? ?There are no preventive care reminders to display for this patient. ? ?Lab Results  ?Component Value Date  ? TSH 2.320 11/24/2018  ? ?Lab Results  ?Component Value Date  ? WBC 8.3 04/29/2020  ? HGB 14.7 04/29/2020  ? HCT 39.9 04/29/2020  ? MCV 84.5 04/29/2020  ? PLT 283 04/29/2020  ? ?Lab Results  ?Component Value Date  ? NA 137 04/29/2020  ? K 3.9 04/29/2020  ? CO2 26 04/29/2020  ? GLUCOSE 101 (H) 04/29/2020  ? BUN 13 04/29/2020  ? CREATININE 0.77 04/29/2020  ? BILITOT 0.8 04/29/2020  ? ALKPHOS 77 04/29/2020  ? AST 22 04/29/2020  ? ALT 19 04/29/2020  ? PROT 7.2 04/29/2020  ? ALBUMIN 3.9 04/29/2020  ? CALCIUM 8.6 (L) 04/29/2020  ? ANIONGAP 4 (L) 04/29/2020  ? ?Lab Results  ?Component Value Date  ? CHOL 193 12/14/2019  ? ?Lab Results  ?Component Value Date  ? HDL 44 12/14/2019  ? ?Lab Results  ?Component Value Date  ? LDLCALC 109 (H) 12/14/2019  ? ?Lab Results  ?Component Value Date  ? TRIG 229 (H) 12/14/2019  ? ?Lab Results  ?Component Value Date  ? CHOLHDL 4.4 12/14/2019  ? ?Lab Results  ?Component Value Date  ? HGBA1C 5.4 11/24/2018  ? ? ?  ?Assessment & Plan:  ? ?Problem List Items Addressed This Visit   ? ?  ? Genitourinary  ? Bacterial vaginosis  ? Relevant Medications  ? metroNIDAZOLE (FLAGYL) 500 MG tablet  ? Candida vaginitis  ? Relevant Medications  ? metroNIDAZOLE (FLAGYL) 500 MG tablet  ? ?Other Visit Diagnoses   ? ? Vaginal odor    -  Primary  ? Relevant Orders  ? Cervicovaginal ancillary only (Completed)  ? Subcutaneous nodule of neck      ? Relevant Orders  ? US Soft Tissue Head/Neck (NON-THYROID)  ? ?  ? ? ?Meds ordered this encounter  ?Medications  ? fluconazole (DIFLUCAN) 150  MG tablet  ?  Sig: Take 1 tablet (150 mg total) by mouth once for 1 dose.  ?  Dispense:  1 tablet  ?  Refill:  0  ?  Order Specific Question:   Supervising Provider  ?  Answer:   Elsie Stain [[id number]]  ? metro

## 2022-02-19 NOTE — Progress Notes (Signed)
Patient has eaten today. ?Patient has not taken medication todat. ?Patient reports noticing a lump on the L side of her neck a year ago that began small, patient reports an increase in size and pain for the past 15 days. ?Patient reports vaginal itching and odor and requested STI testing. ?

## 2022-02-20 ENCOUNTER — Other Ambulatory Visit: Payer: Self-pay

## 2022-02-20 LAB — CERVICOVAGINAL ANCILLARY ONLY
Bacterial Vaginitis (gardnerella): POSITIVE — AB
Candida Glabrata: NEGATIVE
Candida Vaginitis: NEGATIVE
Chlamydia: NEGATIVE
Comment: NEGATIVE
Comment: NEGATIVE
Comment: NEGATIVE
Comment: NEGATIVE
Comment: NEGATIVE
Comment: NORMAL
Neisseria Gonorrhea: NEGATIVE
Trichomonas: NEGATIVE

## 2022-02-20 IMAGING — US US ABDOMEN LIMITED
1 series · 14 of 25 positions shown · non-contrast
Comparison: None.

CLINICAL DATA: Right upper quadrant pain

EXAM:
ULTRASOUND ABDOMEN LIMITED RIGHT UPPER QUADRANT

[Series 1: us abdomen limited ruq · 14 of 28 slices shown]
[im 1/28]
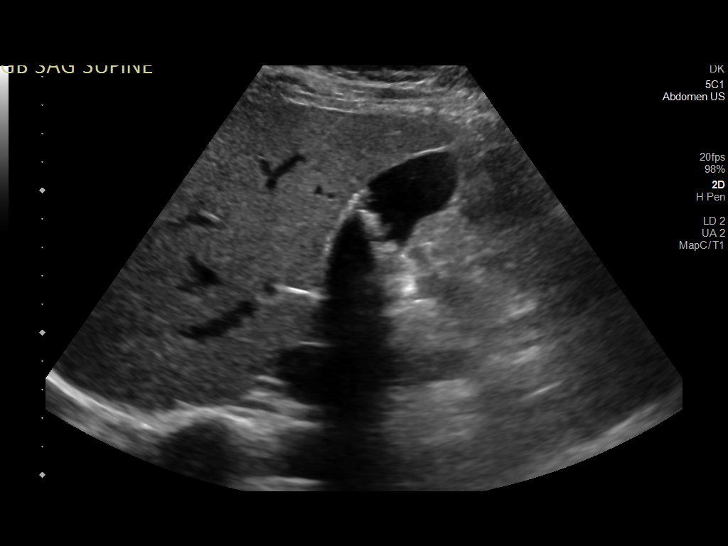
[im 3/28]
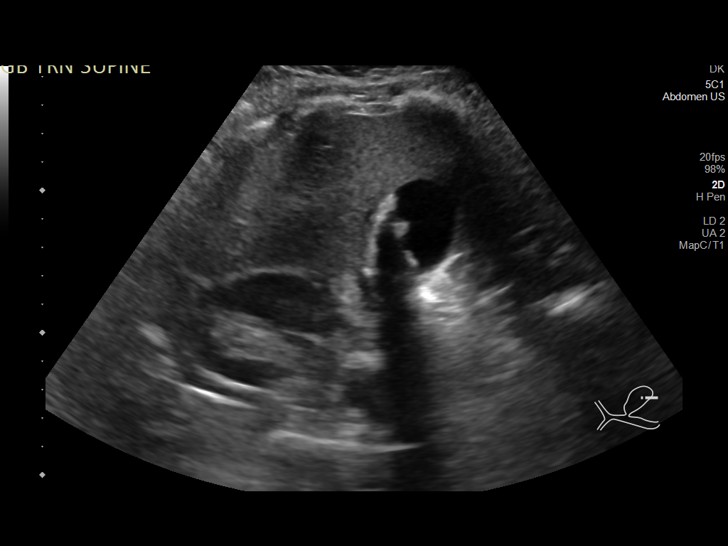
[im 5/28]
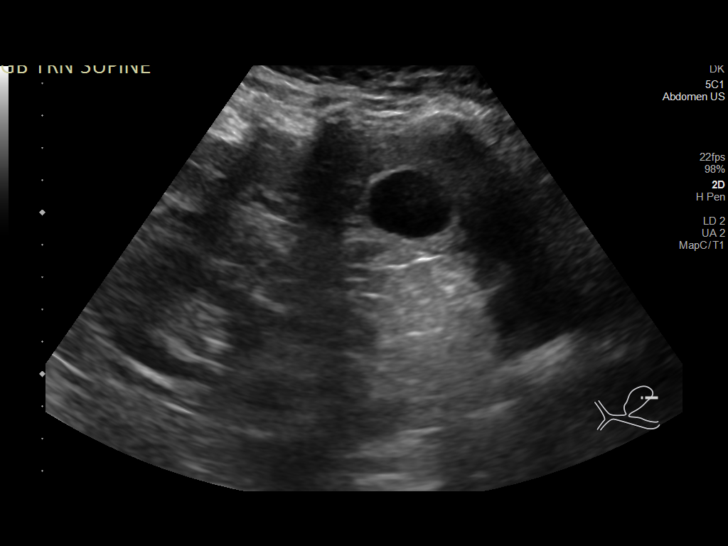
[im 7/28]
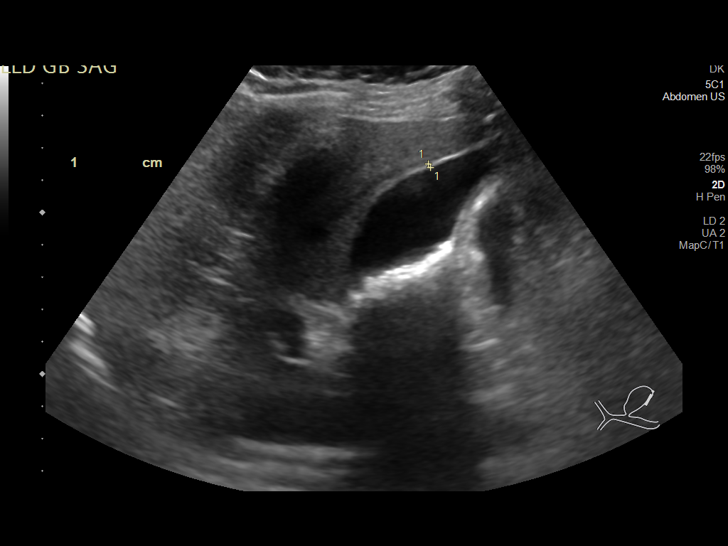
[im 10/28]
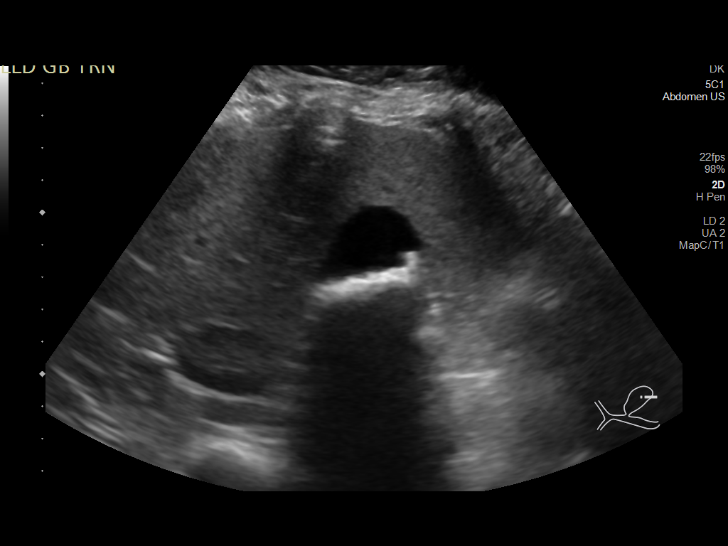
[im 11/28]
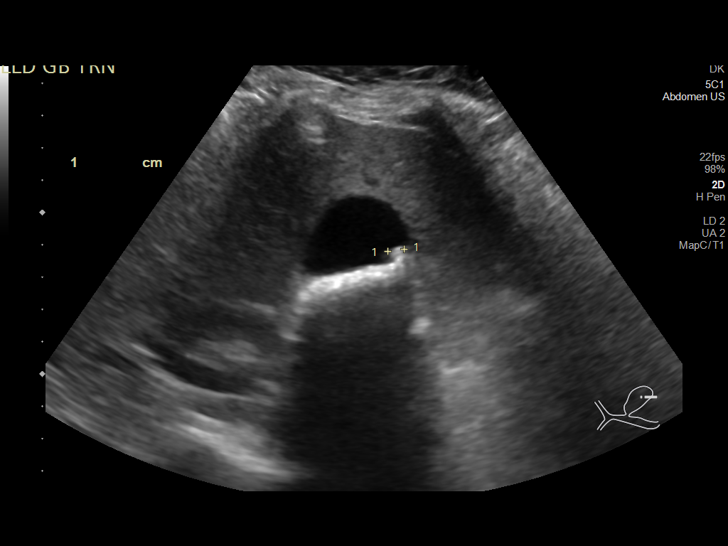
[im 13/28]
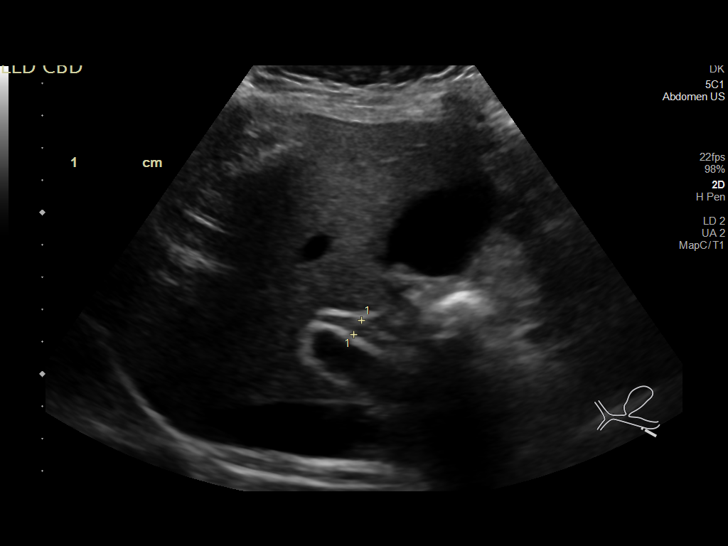
[im 15/28]
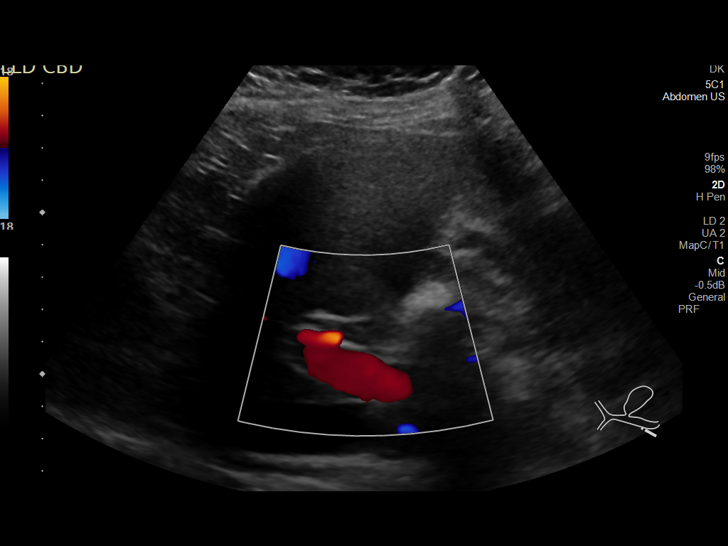
[im 17/28]
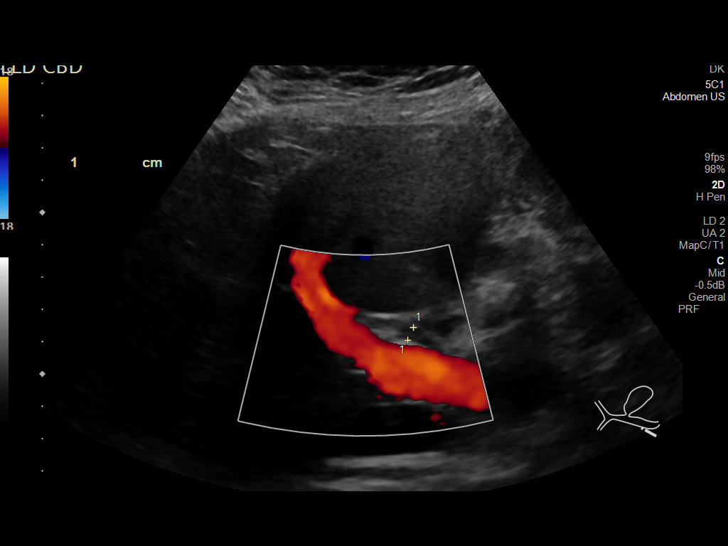
[im 19/28]
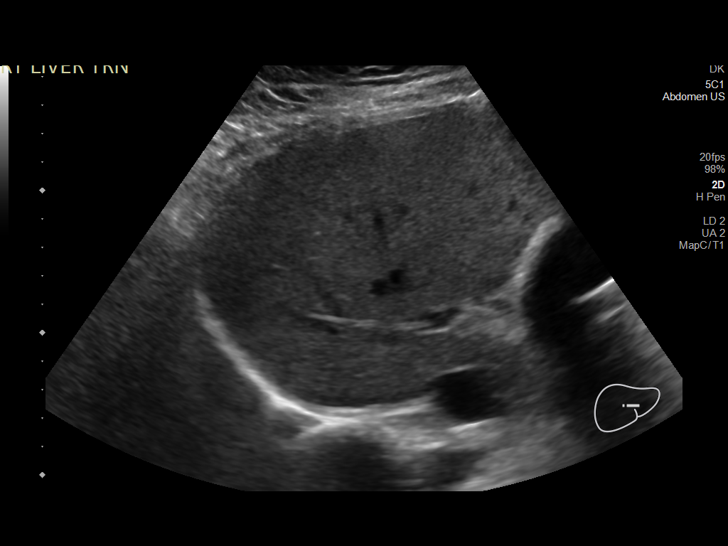
[im 21/28]
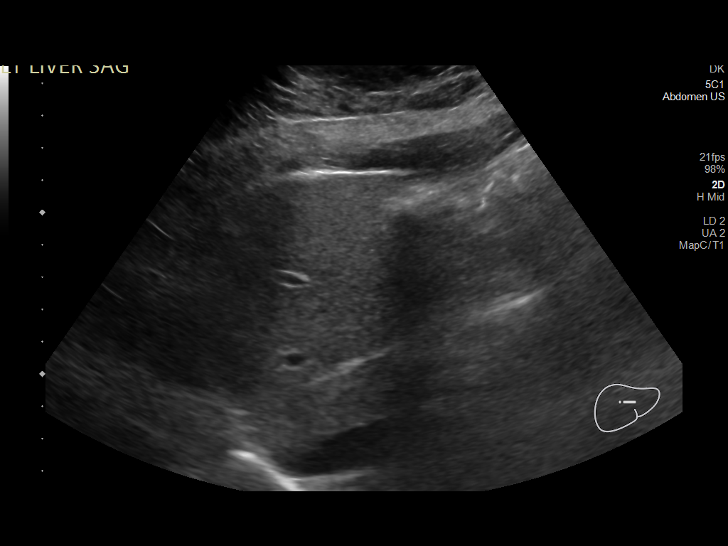
[im 23/28]
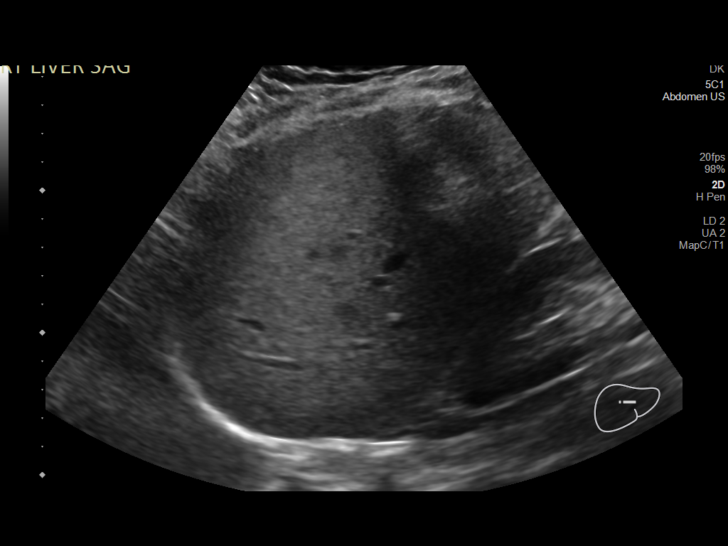
[im 25/28]
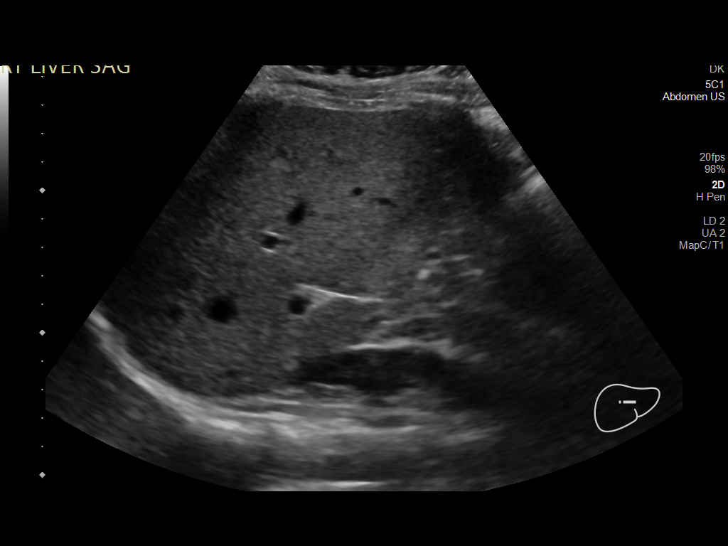
[im 28/28]
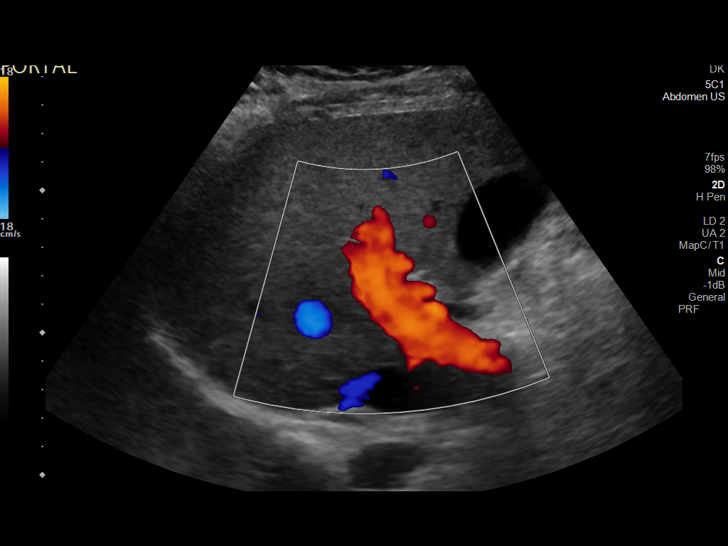

[14 of 25 positions shown; findings below may reference images not displayed]

FINDINGS: Gallbladder:

Within the gallbladder, there are echogenic foci which move and
shadow consistent with cholelithiasis. Largest gallstone measures 5
mm in length. No gallbladder wall thickening or pericholecystic
fluid. No sonographic Murphy sign noted by sonographer.

Common bile duct:

Diameter: 5 mm. No intrahepatic or extrahepatic biliary duct
dilatation.

Liver:

No focal lesion identified. Within normal limits in parenchymal
echogenicity. Portal vein is patent on color Doppler imaging with
normal direction of blood flow towards the liver.

Other: None.
IMPRESSION: Cholelithiasis. No gallbladder wall thickening or pericholecystic
fluid.

Study otherwise unremarkable.

## 2022-02-24 ENCOUNTER — Other Ambulatory Visit: Payer: Self-pay

## 2022-02-25 ENCOUNTER — Encounter (INDEPENDENT_AMBULATORY_CARE_PROVIDER_SITE_OTHER): Payer: Self-pay

## 2022-02-25 ENCOUNTER — Telehealth: Payer: Self-pay | Admitting: Family Medicine

## 2022-02-25 ENCOUNTER — Other Ambulatory Visit: Payer: Self-pay

## 2022-02-25 ENCOUNTER — Ambulatory Visit (INDEPENDENT_AMBULATORY_CARE_PROVIDER_SITE_OTHER): Payer: Self-pay | Admitting: Physician Assistant

## 2022-02-25 ENCOUNTER — Encounter: Payer: Self-pay | Admitting: Physician Assistant

## 2022-02-25 ENCOUNTER — Other Ambulatory Visit: Payer: Self-pay | Admitting: Neurology

## 2022-02-25 VITALS — BP 110/75 | HR 81 | Temp 98.7°F | Resp 18 | Ht 61.0 in | Wt 187.0 lb

## 2022-02-25 DIAGNOSIS — E6609 Other obesity due to excess calories: Secondary | ICD-10-CM | POA: Insufficient documentation

## 2022-02-25 DIAGNOSIS — E781 Pure hyperglyceridemia: Secondary | ICD-10-CM

## 2022-02-25 DIAGNOSIS — Z6835 Body mass index (BMI) 35.0-35.9, adult: Secondary | ICD-10-CM

## 2022-02-25 DIAGNOSIS — E66812 Obesity, class 2: Secondary | ICD-10-CM

## 2022-02-25 DIAGNOSIS — G43001 Migraine without aura, not intractable, with status migrainosus: Secondary | ICD-10-CM

## 2022-02-25 DIAGNOSIS — Z0000 Encounter for general adult medical examination without abnormal findings: Secondary | ICD-10-CM

## 2022-02-25 DIAGNOSIS — Z1159 Encounter for screening for other viral diseases: Secondary | ICD-10-CM

## 2022-02-25 DIAGNOSIS — Z13228 Encounter for screening for other metabolic disorders: Secondary | ICD-10-CM

## 2022-02-25 MED ORDER — METOCLOPRAMIDE HCL 10 MG PO TABS
ORAL_TABLET | ORAL | 11 refills | Status: DC
  Filled 2022-02-25 – 2022-03-06 (×2): qty 15, 5d supply, fill #0

## 2022-02-25 MED ORDER — RIZATRIPTAN BENZOATE 10 MG PO TABS
10.0000 mg | ORAL_TABLET | ORAL | 11 refills | Status: DC | PRN
  Filled 2022-02-25 – 2022-11-27 (×2): qty 10, 30d supply, fill #0

## 2022-02-25 NOTE — Progress Notes (Signed)
Patient has not eaten or taken medication today. ? ?

## 2022-02-25 NOTE — Patient Instructions (Addendum)
I encourage you to increase your water intake to at least 64 ounces a day.  I encourage you to aim to get 7 to 8 hours of sleep a night.  I encourage you to get outside and walk at least 10 minutes a day.  I encourage you to reduce your intake of Coca-Cola to just 1 can a day. ? ?We will call you with today's lab results, you will follow-up with the mobile team in 4 weeks or earlier if needed due to the lab results and ultrasound results. ? ?Kennieth Rad, PA-C ?Physician Assistant ?[facility] ?[email or web address] ? ? ?Control de la [employer] adultos ?Managing Anxiety, Adult ?Despu?s de haber recibido un diagn?stico de ansiedad, es posible que se sienta aliviado al saber por qu? se hab?a sentido o hab?a actuado de [employer]. Es posible que tambi?n se sienta abrumado por el tratamiento que tiene por delante y por lo que este significar? para su vida. Con atenci?n y 49, usted puede controlar esta afecci?n. ?C?mo manejar los cambios en el estilo de vida ?Control del estr?s y la ansiedad ? ?El estr?s es la reacci?n del cuerpo ante los cambios y los acontecimientos de la vida, tanto buenos [place]. La mayor?a de los episodios de estr?s duran s?lo algunas horas, pero el estr?s puede ser continuo y conducir a m?s que solo estr?s. Aunque el estr?s puede desempe?ar un papel importante en la ansiedad, no es lo mismo que la ansiedad. El estr?s generalmente es causado por algo externo, como una fecha l?mite, una prueba o una competencia. El estr?s normalmente pasa despu?s de que el evento desencadenante ha terminado.  ?La ansiedad es causada por algo interno, por ejemplo, imaginar un resultado terrible o preocuparse porque algo ir? mal y lo devastar?. A menudo, la ansiedad no desaparece incluso despu?s de que el evento desencadenante ha finalizado y puede tornarse en una preocupaci?n a largo plazo (cr?nica). Es importante comprender las diferencias entre el  estr?s y la ansiedad, y Chief Technology Officer el estr?s de manera efectiva para que no genere una respuesta de ansiedad. ?Hable con el m?dico o un consejero para obtener m?s informaci?n sobre c?mo reducir la ansiedad y el estr?s. Es posible que el profesional sugiera t?cnicas para reducir la tensi?n, tales como: ?Musicoterapia. Pase tiempo creando o escuchando m?sica que disfrute y lo inspire. ?Meditaci?n consciente. Practique el estar consciente de la respiraci?n normal sin intentar controlarla. Puede realizarse mientras est? sentado o camina. ?Oraci?n centrante. Esto implica centrarse en una palabra, frase o imagen sagrada que le signifique algo y le genere paz. ?Respiraci?n profunda. Para hacer esto, expanda el est?mago e inhale lentamente por la nariz. [place] unos 3 a 5 segundos. Luego, exhale lentamente mientras deja que los m?sculos del est?mago se relajen. ?Di?logo interno. Aprenda a notar e identificar patrones de pensamiento que conducen a reacciones de ansiedad y cambie esos patrones a pensamientos que se transmitan tranquilidad. ?Relajaci?n muscular. Dedique tiempo a tensar los m?sculos y Scientist, forensic. ?Elija una t?cnica para reducir la tensi?n que se adapte a su estilo de vida y su personalidad. Estas t?cnicas llevan tiempo y pr?ctica. Res?rvese de 5 a 15 minutos por d?a para hacerlas. Algunos terapeutas pueden ofrecer orientaci?n y capacitaci?n en estas t?cnicas. Es posible que algunos planes de seguros m?dicos cubran la capacitaci?n. ?Otras cosas que puede hacer para controlar el estr?s y la ansiedad incluyen: ?Llevar un registro del estr?s. Esto puede ayudarlo a identificar qu? le desencadena su reacci?n y, luego, aprender  las [employer] de controlar su respuesta al [place]. ?Pensar en c?mo reacciona ante ciertas situaciones. Es posible que no sea capaz de Chief Technology Officer todo, pero puede Environmental education officer. ?Hacerse tiempo para las actividades que lo ayudan a Nurse, children's y no sentir culpa por  pasar su tiempo de [place]. ?Crear im?genes visuales. Esto implica imaginar o crear im?genes mentales para ayudarlo a relajarse. ?Practicar yoga. A trav?s del yoga, puede disminuir la tensi?n y Clinical cytogeneticist. ? ?Medicamentos ?Los medicamentos pueden ayudar a [employer] s?ntomas. Algunos medicamentos para la ansiedad: ?Antidepresivos. Por lo general, se recetan para el control diario a Barrister's clerk. ?Medicamentos para la ansiedad. Estos se pueden agregar en casos graves, especialmente cuando ocurren crisis de [place]. ?Un m?dico recetar? los medicamentos. Cuando se usan juntos, los medicamentos, la psicoterapia y las t?cnicas de reducci?n de la tensi?n pueden ser el tratamiento m?s [place]. ?Las relaciones ?Las relaciones interpersonales pueden ser muy importantes para ayudar a su recuperaci?n. Intente pasar m?s tiempo interactuando con amigos y familiares de [place]. ?Considere la posibilidad de ir a terapia de pareja, si tiene una pareja, tomar clases de educaci?n familiar o ir a Careers information officer. ?La terapia puede ayudarlos a usted y a los dem?s a comprender mejor su afecci?n. ?C?mo reconocer cambios en su ansiedad ?Cada persona responde de Geographical information systems officer diferente al tratamiento de la ansiedad. Se dice que est? recuperado de la ansiedad cuando los s?ntomas disminuyen y dejan de Cabin crew en las actividades diarias en el hogar o [place]. Esto podr?a significar que comenzar? a hacer lo siguiente: ?Estate agent concentraci?n y atenci?n. Tener menos interferencia de la preocupaci?n en el pensamiento diario. ?Dormir mejor. ?Estar menos irritable. ?Tener m?s energ?a. ?Tener [employer]. ?Tambi?n es Glass blower/designer cuando su afecci?n empeora. Comun?quese con el m?dico si sus s?ntomas interfieren en su hogar o su trabajo, y usted siente que su afecci?n no est? mejorando. ?Siga estas instrucciones en su casa: ?Actividad ?Actividad f?sica. Los adultos deben hacer lo siguiente: ?Optometrist, al menos, 150  minutos de [place] f?sica por semana. El ejercicio debe aumentar la frecuencia card?aca y Nature conservation officer transpirar (ejercicio de intensidad moderada). ?Realizar ejercicios de fortalecimiento por lo [employer] por semana. ?Dormir bien y por el tiempo adecuado. La mayor?a de los adultos [employer] 7 y 9 horas de sue?o todas las noches. ?[place] ? ?Siga una dieta saludable que incluya abundantes frutas, verduras, cereales integrales, productos l?cteos descremados y prote?nas magras. ?No consuma muchos alimentos con alto contenido de grasas, az?cares agregados o sal (sodio). ?Opte por cosas que le simplifiquen la vida. ?No consuma ning?n producto que contenga nicotina o tabaco. Estos productos incluyen cigarrillos, tabaco para mascar y aparatos de vapeo, como los cigarrillos electr?nicos. Si necesita ayuda para dejar de fumar, consulte al m?dico. ?Evite el consumo de cafe?na, alcohol y ciertos medicamentos contra el resfr?o de venta sin receta. Estos podr?an Engineer, building services. Preg?ntele al farmac?utico qu? medicamentos no deber?a tomar. ?Instrucciones generales ?Use los medicamentos de venta libre y los recetados solamente como se lo haya indicado el m?dico. ?Concurra a [place]. Esto es importante. ?D?nde buscar apoyo ?Puede conseguir ayuda y [employer] siguientes lugares: ?Grupos de [place]. ?Organizaciones comunitarias y en l?nea. ?Un l?der espiritual de confianza. ?Terapia de pareja. ?Clases de educaci?n familiar. ?Terapia familiar. ?D?nde obtener m?s informaci?n ?Formar parte de un grupo de apoyo podr?a resultarle ?til para Engineer, civil (consulting) ansiedad. Las siguientes fuentes pueden ayudarlo a Orthoptist consejeros o grupos de apoyo cerca de su hogar: ?  Mental Health [place] (Salud Mental de los [place]): www.mentalhealthamerica.net ?Anxiety and Depression Association of America [ADAA] (Asociaci?n de Ansiedad y Depresi?n de los Estados Unidos): https://www.clark.net/ ?Mirant on Mental Illness [NAMI] ([place]): www.nami.org ?Comun?quese con un m?dico si: ?Le resulta dif?cil permanecer concentrado o finalizar las tareas diarias. ?Pasa mucha

## 2022-02-25 NOTE — Telephone Encounter (Signed)
[facility] requested.  ?

## 2022-02-25 NOTE — Telephone Encounter (Signed)
Pt is stating that her meds aren't at [facility], that they are at [facility]. She need them sent to the [facility] as it is cheaper for her to pay for the meds. The specific med she is asking about is Rizatriptan. ?

## 2022-02-25 NOTE — Progress Notes (Signed)
? ?Annual Wellness Visit ? ?  ? ?Patient: Meghan Reeves, Female    DOB: Oct 18, 1981, 41 y.o.   MRN: [id number] ? ?Subjective  ?Chief Complaint  ?Patient presents with  ? Annual Exam  ? ?HPI ? ?Meghan Reeves is a 41 y.o. female who presents today for her Annual Wellness Visit. ?She reports consuming a general diet. Does endorse highamount of flour The patient does not participate in regular exercise at present. She generally feels tired. She reports sleeping poorly, states that she goes to bed late and is only sleeping 5-6 hours, states that she has a child that goes to bed late and then she spends time relaxing before bed.  States that she likes to watch television and scrolls on her phone after they go to bed.  States that she likes to eat cookies during that time. She does not have additional problems to discuss today.  ? ? ? ?Vision:no updated exam, does not require vision correction, last time 3 years ago and Dental: No current dental problems and Receives regular dental care ? ? ?Past Medical History:  ?Diagnosis Date  ? Asthma, mild   ? Medical history non-contributory   ? ?Social History  ? ?Socioeconomic History  ? Marital status: Significant Other  ?  Spouse name: Not on file  ? Number of children: 1  ? Years of education: Not on file  ? Highest education level: 6th grade  ?Occupational History  ? Not on file  ?Tobacco Use  ? Smoking status: Former  ?  Packs/day: 0.25  ?  Years: 10.00  ?  Pack years: 2.50  ?  Types: Cigarettes  ? Smokeless tobacco: Never  ?Vaping Use  ? Vaping Use: Never used  ?Substance and Sexual Activity  ? Alcohol use: No  ?  Alcohol/week: 0.0 standard drinks  ? Drug use: No  ? Sexual activity: Yes  ?  Birth control/protection: None  ?Other Topics Concern  ? Not on file  ?Social History Narrative  ? ** Merged History Encounter **  ?    ? Lives at home with boyfriend  ?Right handed ?Caffeine: 30 oz daily of coca cola  ? ?Social Determinants of Health  ? ?Financial  Resource Strain: Not on file  ?Food Insecurity: Not on file  ?Transportation Needs: Not on file  ?Physical Activity: Not on file  ?Stress: Not on file  ?Social Connections: Not on file  ?Intimate Partner Violence: Not on file  ? ?Family History  ?Problem Relation Age of Onset  ? Asthma Mother   ? Migraines Sister   ? ?Allergies  ?Allergen Reactions  ? Topamax [Topiramate] Other (See Comments)  ?  Blurry vision, numbness of lips  ? ?  ? ?Medications: ?Outpatient Medications Prior to Visit  ?Medication Sig  ? metroNIDAZOLE (FLAGYL) 500 MG tablet Take 1 tablet (500 mg total) by mouth 2 (two) times daily for 7 days.  ? [DISCONTINUED] metoCLOPramide (REGLAN) 10 MG tablet TAKE 1 TABLET (10 MG TOTAL) BY MOUTH 3 (THREE) TIMES DAILY AS NEEDED FOR NAUSEA (OR HEADACHE). (Patient not taking: Reported on 02/19/2022)  ? [DISCONTINUED] rizatriptan (MAXALT) 10 MG tablet Take 1 tablet (10 mg total) by mouth as needed for migraine. May repeat in 2 hours if needed (Patient not taking: Reported on 02/19/2022)  ? ?No facility-administered medications prior to visit.  ?  ?Allergies  ?Allergen Reactions  ? Topamax [Topiramate] Other (See Comments)  ?  Blurry vision, numbness of lips  ? ? ?Patient Care Team: ?  Nicolette Bang, MD as PCP - General (Family Medicine) ?Patient, No Pcp Per (Inactive) (General Practice) ? ?Review of Systems  ?Constitutional:  Positive for malaise/fatigue.  ?HENT: Negative.    ?Eyes: Negative.   ?Respiratory:  Negative for shortness of breath.   ?Cardiovascular:  Negative for chest pain.  ?Gastrointestinal: Negative.   ?Genitourinary: Negative.   ?Musculoskeletal: Negative.   ?Skin: Negative.   ?Neurological: Negative.   ?Endo/Heme/Allergies: Negative.   ?Psychiatric/Behavioral: Negative.    ? ? ?  ? ?Objective  ?BP 110/75 (BP Location: Right Arm, Patient Position: Sitting, Cuff Size: Normal)   Pulse 81   Temp 98.7 ?F (37.1 ?C) (Oral)   Resp 18   Ht '5\' 1"'$  (1.549 m)   Wt 187 lb (84.8 kg)   LMP  01/26/2022   SpO2 97%   BMI 35.33 kg/m?  ?BP Readings from Last 3 Encounters:  ?02/25/22 110/75  ?02/19/22 112/77  ?01/21/22 128/75  ? ?Wt Readings from Last 3 Encounters:  ?02/25/22 187 lb (84.8 kg)  ?02/19/22 186 lb (84.4 kg)  ?01/21/22 186 lb 8 oz (84.6 kg)  ? ?  ? ?Physical Exam ?Vitals and nursing note reviewed.  ?Constitutional:   ?   Appearance: Normal appearance. She is obese.  ?HENT:  ?   Head: Normocephalic and atraumatic.  ?   Right Ear: Tympanic membrane, ear canal and external ear normal.  ?   Left Ear: Tympanic membrane, ear canal and external ear normal.  ?   Nose: Nose normal.  ?   Mouth/Throat:  ?   Mouth: Mucous membranes are moist.  ?   Pharynx: Oropharynx is clear.  ?Eyes:  ?   Extraocular Movements: Extraocular movements intact.  ?   Conjunctiva/sclera: Conjunctivae normal.  ?Cardiovascular:  ?   Rate and Rhythm: Normal rate and regular rhythm.  ?   Pulses: Normal pulses.  ?   Heart sounds: Normal heart sounds.  ?Pulmonary:  ?   Effort: Pulmonary effort is normal.  ?   Breath sounds: Normal breath sounds.  ?Abdominal:  ?   General: Abdomen is flat. Bowel sounds are normal.  ?   Palpations: Abdomen is soft.  ?   Tenderness: There is no abdominal tenderness.  ?Musculoskeletal:     ?   General: Normal range of motion.  ?   Cervical back: Normal range of motion and neck supple.  ?Skin: ?   General: Skin is warm and dry.  ?Neurological:  ?   General: No focal deficit present.  ?   Mental Status: She is alert and oriented to person, place, and time.  ?Psychiatric:     ?   Mood and Affect: Mood normal.     ?   Behavior: Behavior normal.     ?   Thought Content: Thought content normal.     ?   Judgment: Judgment normal.  ? ? ? ? ?Most recent functional status assessment: ?   ? View : No data to display.  ?  ?  ?  ? ?Most recent fall risk assessment: ? ?  02/25/2022  ? 10:30 AM  ?Fall Risk   ?Falls in the past year? 0  ?Number falls in past yr: 0  ?Injury with Fall? 0  ?Follow up Falls evaluation  completed  ? ? Most recent depression screenings: ? ?  02/25/2022  ? 10:30 AM 01/06/2021  ?  9:04 AM  ?PHQ 2/9 Scores  ?PHQ - 2 Score 0 1  ?PHQ- 9 Score 2   ? ?  Most recent cognitive screening: ?   ? View : No data to display.  ?  ?  ?  ? ?Most recent Audit-C alcohol use screening ?   ? View : No data to display.  ?  ?  ?  ? ?A score of 3 or more in women, and 4 or more in men indicates increased risk for alcohol abuse, EXCEPT if all of the points are from question 1  ? ? ?  ? ?Assessment & Plan  ? ?Annual wellness visit done today including the all of the following: ?Reviewed patient's Family Medical History ?Reviewed and updated list of patient's medical providers ?Assessment of cognitive impairment was done ?Assessed patient's functional ability ?Established a written schedule for health screening services ?Health Risk Assessent Completed and Reviewed ? ?Exercise Activities and Dietary recommendations ? Goals   ?None ?  ? ? ?Immunization History  ?Administered Date(s) Administered  ? Tdap 06/10/2017, 11/24/2018  ? ? ?Health Maintenance  ?Topic Date Due  ? COVID-19 Vaccine (1) Never done  ? Hepatitis C Screening  Never done  ? INFLUENZA VACCINE  06/09/2022  ? PAP SMEAR-Modifier  12/13/2024  ? TETANUS/TDAP  11/24/2028  ? HIV Screening  Completed  ? HPV VACCINES  Aged Out  ? ? ? ?Discussed health benefits of physical activity, and encouraged her to engage in regular exercise appropriate for her age and condition.  ?  ?Problem List Items Addressed This Visit   ?None ?Visit Diagnoses   ? ? Encounter for wellness examination    -  Primary  ? Hypertriglyceridemia      ? Relevant Orders  ? Lipid panel  ? Encounter for HCV screening test for low risk patient      ? Relevant Orders  ? HCV Ab w Reflex to Quant PCR  ? Screening for metabolic disorder      ? Relevant Orders  ? CBC with Differential/Platelet  ? Comp. Metabolic Panel (12)  ? TSH  ? Class 2 obesity due to excess calories with body mass index (BMI) of 35.0 to 35.9  in adult, unspecified whether serious comorbidity present      ? ?  ? ?1. Encounter for wellness examination ?Patient education given on improving sleep hygiene, increasing water intake, increasing exercise and reducing a

## 2022-02-25 NOTE — Telephone Encounter (Signed)
Refill has been sent for the pt to the [facility] ?

## 2022-02-25 NOTE — Progress Notes (Signed)
Patient received results during her OV visit. ?

## 2022-02-26 LAB — COMP. METABOLIC PANEL (12)
AST: 24 IU/L (ref 0–40)
Albumin/Globulin Ratio: 1.6 (ref 1.2–2.2)
Albumin: 4.2 g/dL (ref 3.8–4.8)
Alkaline Phosphatase: 78 IU/L (ref 44–121)
BUN/Creatinine Ratio: 17 (ref 9–23)
BUN: 13 mg/dL (ref 6–20)
Bilirubin Total: 0.5 mg/dL (ref 0.0–1.2)
Calcium: 9 mg/dL (ref 8.7–10.2)
Chloride: 105 mmol/L (ref 96–106)
Creatinine, Ser: 0.77 mg/dL (ref 0.57–1.00)
Globulin, Total: 2.6 g/dL (ref 1.5–4.5)
Glucose: 96 mg/dL (ref 70–99)
Potassium: 4.3 mmol/L (ref 3.5–5.2)
Sodium: 141 mmol/L (ref 134–144)
Total Protein: 6.8 g/dL (ref 6.0–8.5)
eGFR: 101 mL/min/{1.73_m2} (ref >59)

## 2022-02-26 LAB — HCV INTERPRETATION

## 2022-02-26 LAB — LIPID PANEL
Chol/HDL Ratio: 4 ratio (ref 0.0–4.4)
Cholesterol, Total: 190 mg/dL (ref 100–199)
HDL: 48 mg/dL (ref >39)
LDL Chol Calc (NIH): 106 mg/dL — ABNORMAL HIGH (ref 0–99)
Triglycerides: 211 mg/dL — ABNORMAL HIGH (ref 0–149)
VLDL Cholesterol Cal: 36 mg/dL (ref 5–40)

## 2022-02-26 LAB — CBC WITH DIFFERENTIAL/PLATELET
Basophils Absolute: 0 10*3/uL (ref 0.0–0.2)
Basos: 0 %
EOS (ABSOLUTE): 0.3 10*3/uL (ref 0.0–0.4)
Eos: 4 %
Hematocrit: 41.1 % (ref 34.0–46.6)
Hemoglobin: 14.3 g/dL (ref 11.1–15.9)
Immature Grans (Abs): 0 10*3/uL (ref 0.0–0.1)
Immature Granulocytes: 0 %
Lymphocytes Absolute: 2.5 10*3/uL (ref 0.7–3.1)
Lymphs: 29 %
MCH: 31.1 pg (ref 26.6–33.0)
MCHC: 34.8 g/dL (ref 31.5–35.7)
MCV: 89 fL (ref 79–97)
Monocytes Absolute: 0.5 10*3/uL (ref 0.1–0.9)
Monocytes: 6 %
Neutrophils Absolute: 5.2 10*3/uL (ref 1.4–7.0)
Neutrophils: 61 %
Platelets: 266 10*3/uL (ref 150–450)
RBC: 4.6 x10E6/uL (ref 3.77–5.28)
RDW: 12.7 % (ref 11.7–15.4)
WBC: 8.5 10*3/uL (ref 3.4–10.8)

## 2022-02-26 LAB — TSH: TSH: 1.73 u[IU]/mL (ref 0.450–4.500)

## 2022-02-26 LAB — HCV AB W REFLEX TO QUANT PCR: HCV Ab: NONREACTIVE

## 2022-02-27 ENCOUNTER — Telehealth: Payer: Self-pay | Admitting: *Deleted

## 2022-02-27 NOTE — Telephone Encounter (Signed)
-----   Message from Kennieth Rad, PA-C sent at 02/26/2022  4:33 PM EDT ----- ?Please call patient and let her know that her thyroid function, kidney and liver function are within normal limits.  She does not show signs of anemia.  Her screening for hepatitis C is negative.  Her cholesterol overall is within normal limits, however her triglycerides and her LDL are elevated.  It is very important that she follow a low fat, low cholesterol diet and have these rechecked in 6 months to a year. ?

## 2022-02-27 NOTE — Telephone Encounter (Signed)
Medical Assistant used [employer] to contact patient.  ?Interpreter Name: Lorriane Shire Interpreter #: [id number] ?Patient is aware of labs being normal except for triglycerides being elevated and needing to adhere to a low cholesterol diet and to follow up with a recheck in 6 months. ? ?

## 2022-03-03 ENCOUNTER — Other Ambulatory Visit: Payer: Self-pay

## 2022-03-06 ENCOUNTER — Ambulatory Visit (HOSPITAL_COMMUNITY)
Admission: RE | Admit: 2022-03-06 | Discharge: 2022-03-06 | Disposition: A | Discharge status: Home / Self Care | Payer: Self-pay | Source: Ambulatory Visit | Attending: Physician Assistant | Admitting: Physician Assistant

## 2022-03-06 ENCOUNTER — Other Ambulatory Visit: Payer: Self-pay

## 2022-03-06 DIAGNOSIS — R221 Localized swelling, mass and lump, neck: Secondary | ICD-10-CM | POA: Insufficient documentation

## 2022-03-12 ENCOUNTER — Telehealth: Payer: Self-pay | Admitting: *Deleted

## 2022-03-12 NOTE — Telephone Encounter (Signed)
Medical Assistant used [employer] to contact patient.  ?Interpreter Name: Maximino Sarin Interpreter #: [id number] ?Patient verified DOB ?Patient is aware of no concerns with the lipoma and she may contact the office if the area becomes bothersome. ?Patient reports constant pain is present and she would like a referral to general surgery. ? ?

## 2022-03-12 NOTE — Telephone Encounter (Signed)
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  No further treatment is needed, if this becomes bothersome, she can consider seeing general surgery for removal. ?

## 2022-03-16 ENCOUNTER — Other Ambulatory Visit: Payer: Self-pay | Admitting: Physician Assistant

## 2022-03-16 DIAGNOSIS — D170 Benign lipomatous neoplasm of skin and subcutaneous tissue of head, face and neck: Secondary | ICD-10-CM

## 2022-03-24 ENCOUNTER — Encounter: Payer: Self-pay | Admitting: Surgery

## 2022-03-24 ENCOUNTER — Other Ambulatory Visit: Payer: Self-pay

## 2022-03-24 ENCOUNTER — Ambulatory Visit: Payer: Self-pay | Admitting: Surgery

## 2022-03-24 VITALS — BP 132/80 | HR 60 | Temp 98.3°F | Ht 60.0 in | Wt 186.6 lb

## 2022-03-24 DIAGNOSIS — D170 Benign lipomatous neoplasm of skin and subcutaneous tissue of head, face and neck: Secondary | ICD-10-CM

## 2022-03-24 DIAGNOSIS — M629 Disorder of muscle, unspecified: Secondary | ICD-10-CM | POA: Insufficient documentation

## 2022-03-24 DIAGNOSIS — M542 Cervicalgia: Secondary | ICD-10-CM

## 2022-03-24 NOTE — Progress Notes (Signed)
Patient ID: Meghan Reeves, female   DOB: 1981-05-31, 41 y.o.   MRN: [id number] ? ?Chief Complaint: Subcutaneous lipomas ? ?History of Present Illness ?Meghan Reeves is a 41 y.o. female with 2 small subcutaneous lipomatous masses overlying the left trapezius.  She has some issues with neck pain turning toward the left side.  And feels that these may be to blame, and perhaps the removal will enable her to have greater cervical mobility, and freedom from pain.  It appears that the pain is primarily arising from the trapezius itself.  And she asked what other therapies are helpful. ? ?Past Medical History ?Past Medical History:  ?Diagnosis Date  ? Asthma, mild   ? Medical history non-contributory   ?  ? ? ?Past Surgical History:  ?Procedure Laterality Date  ? APPENDECTOMY  09/2016  ? CESAREAN SECTION N/A 09/07/2017  ? Procedure: CESAREAN SECTION;  Surgeon: Mora Bellman, MD;  Location: [facility];  Service: Obstetrics;  Laterality: N/A;  ? LAPAROSCOPIC APPENDECTOMY N/A 09/29/2016  ? Procedure: APPENDECTOMY LAPAROSCOPIC;  Surgeon: Greer Pickerel, MD;  Location: [facility];  Service: General;  Laterality: N/A;  ? NO PAST SURGERIES    ? VENTRAL HERNIA REPAIR N/A 04/29/2020  ? Procedure: HERNIA REPAIR VENTRAL ADULT, Open;  Surgeon: Ronny Bacon, MD;  Location: [facility];  Service: General;  Laterality: N/A;  ? ? ?Allergies  ?Allergen Reactions  ? Topamax [Topiramate] Other (See Comments)  ?  Blurry vision, numbness of lips  ? ? ?Current Outpatient Medications  ?Medication Sig Dispense Refill  ? metoCLOPramide (REGLAN) 10 MG tablet TAKE 1 TABLET (10 MG TOTAL) BY MOUTH 3 (THREE) TIMES DAILY AS NEEDED FOR NAUSEA (OR HEADACHE). 15 tablet 11  ? rizatriptan (MAXALT) 10 MG tablet Take 1 tablet (10 mg total) by mouth as needed for migraine. May repeat in 2 hours if needed 10 tablet 11  ? ?No current facility-administered medications for this visit.  ? ? ?Family History ?Family History  ?Problem Relation  Age of Onset  ? Asthma Mother   ? Migraines Sister   ?  ? ? ?Social History ?Social History  ? ?Tobacco Use  ? Smoking status: Former  ?  Packs/day: 0.25  ?  Years: 10.00  ?  Pack years: 2.50  ?  Types: Cigarettes  ?  Quit date: 04/06/2018  ?  Years since quitting: 3.9  ? Smokeless tobacco: Never  ?Vaping Use  ? Vaping Use: Never used  ?Substance Use Topics  ? Alcohol use: No  ?  Alcohol/week: 0.0 standard drinks  ? Drug use: No  ?  ?  ? ?Physical Exam ?Blood pressure 132/80, pulse 60, temperature 98.3 ?F (36.8 ?C), temperature source Oral, height 5' (1.524 m), weight 186 lb 9.6 oz (84.6 kg), SpO2 96 %, unknown if currently breastfeeding. ?Last Weight  Most recent update: 03/24/2022  1:18 PM  ? ? Weight  ?84.6 kg (186 lb 9.6 oz)  ?      ? ?  ? ? ?CONSTITUTIONAL: Well developed, and nourished, appropriately responsive and aware without distress.   ?EYES: Sclera non-icteric.   ?EARS, NOSE, MOUTH AND THROAT:  The oropharynx is clear. Oral mucosa is pink and moist.   Hearing is intact to voice.  ?NECK: Trachea is midline, and there is no jugular venous distension.  ?LYMPH NODES:  Lymph nodes in the neck are not enlarged. ?RESPIRATORY:  Lungs are clear, and breath sounds are equal bilaterally. Normal respiratory effort without pathologic use of accessory muscles. ?CARDIOVASCULAR:  Heart is regular in rate and rhythm. ?GI: The abdomen is  soft, nontender, and nondistended.  ?MUSCULOSKELETAL:  Symmetrical muscle tone appreciated in all four extremities.   Underlying trapezius appears to be tender. ?SKIN: Skin turgor is normal. No pathologic skin lesions appreciated.  ?Overlying the left trapezius in the subcutaneous plane, there are 2 well discrete defined lipomata the maximum is under 2 cm.  These are mobile and nontender. ?NEUROLOGIC:  Motor and sensation appear grossly normal.  Cranial nerves are grossly without defect. ?PSYCH:  Alert and oriented to person, place and time. Affect is appropriate for situation. ? ?Data  Reviewed ?I have personally reviewed what is currently available of the patient's imaging, recent labs and medical records.   ?Labs:  ? ?  Latest Ref Rng & Units 02/25/2022  ? 10:51 AM 04/29/2020  ? 11:08 AM 04/07/2020  ? 12:17 AM  ?CBC  ?WBC 3.4 - 10.8 x10E3/uL 8.5   8.3   11.3    ?Hemoglobin 11.1 - 15.9 g/dL 14.3   14.7   14.2    ?Hematocrit 34.0 - 46.6 % 41.1   39.9   40.7    ?Platelets 150 - 450 x10E3/uL 266   283   324    ? ? ?  Latest Ref Rng & Units 02/25/2022  ? 10:51 AM 04/29/2020  ? 11:08 AM 04/07/2020  ? 12:17 AM  ?CMP  ?Glucose 70 - 99 mg/dL 96   101   164    ?BUN 6 - 20 mg/dL '13   13   11    '$ ?Creatinine 0.57 - 1.00 mg/dL 0.77   0.77   0.98    ?Sodium 134 - 144 mmol/L 141   137   137    ?Potassium 3.5 - 5.2 mmol/L 4.3   3.9   3.3    ?Chloride 96 - 106 mmol/L 105   107   102    ?CO2 22 - 32 mmol/L  26   22    ?Calcium 8.7 - 10.2 mg/dL 9.0   8.6   8.9    ?Total Protein 6.0 - 8.5 g/dL 6.8   7.2   6.9    ?Total Bilirubin 0.0 - 1.2 mg/dL 0.5   0.8   0.9    ?Alkaline Phos 44 - 121 IU/L 78   77   84    ?AST 0 - 40 IU/L 24   22   32    ?ALT 0 - 44 U/L  19   29    ? ? ? ? ?Imaging: ? ?Within last 24 hrs: No results found. ? ?Assessment ?   ?Subcutaneous lipoma  ?Neck pain. ?Patient Active Problem List  ? Diagnosis Date Noted  ? Hypertriglyceridemia 02/25/2022  ? Class 2 obesity due to excess calories with body mass index (BMI) of 35.0 to 35.9 in adult 02/25/2022  ? Bacterial vaginosis 01/09/2021  ? Candida vaginitis 01/09/2021  ? Status post laparoscopic cholecystectomy 05/14/2020  ? Diastasis recti 04/25/2020  ? Nexplanon in place 12/14/2019  ? Migraine without aura and with status migrainosus, not intractable 12/08/2018  ? Medication overuse headache 12/08/2018  ? Tobacco use disorder, continuous 02/03/2017  ? Encounter for smoking cessation counseling 02/03/2017  ? Spanish speaking patient 10/01/2016  ? ? ?Plan ?   ?We discussed options of massage therapy, chiropractic consultation, anti-inflammatory trial etc.  I  discouraged her to pursue further lipoma excision hoping that this will resolve the tension in her left trapezius, I will  not bring the goal of diminished neck pain when turning to the left. ? ?Face-to-face time spent with the patient and accompanying care providers(if present) was 25 minutes, with more than 50% of the time spent counseling, educating, and coordinating care of the patient.   ? ?These notes generated with voice recognition software. I apologize for typographical errors. ? ?Ronny Bacon M.D., FACS ?03/24/2022, 1:32 PM ? ? ? ? ?

## 2022-03-24 NOTE — Patient Instructions (Addendum)
Please call with any questions or concerns.Lipoma ?Lipoma ? ?Un lipoma es un tumor no canceroso (benigno) formado por c?lulas de grasa. Es un tipo muy frecuente de [employer] tejidos blandos. Por lo general, los lipomas se encuentran debajo de la piel (subcut?neos). Pueden aparecer en cualquier tejido del cuerpo que contenga grasa. Las zonas en las que los lipomas aparecen con mayor frecuencia incluyen la espalda, los [place], los hombros, las nalgas y los muslos. ?Los lipomas crecen lentamente y, en general, son indoloros. La mayor?a de los lipomas no causan problemas y no requieren Clinical research associate. ??Cu?les son las causas? ?Se desconoce la causa de esta afecci?n. ??Qu? [place]? ?Es m?s probable que sufra esta afecci?n si: ?Tiene entre 40 y 51 a?os de edad. ?Tiene antecedentes familiares de lipomas. ??Cu?les son los signos o s?ntomas? ?Por lo general, el lipoma aparece como una peque?a protuberancia redonda debajo de la piel. En la mayor?a de los [place], el bulto tiene las siguientes caracter?sticas: ?Se siente suave o el?stico. ?No causa dolor ni otros s?ntomas. ?Sin embargo, si el lipoma se encuentra en un ?rea en la que hace presi?n [employer] nervios, puede causar dolor u otros s?ntomas. ??C?mo se diagnostica? ?Por lo general, el lipoma puede diagnosticarse con un examen f?sico. Tambi?n pueden hacerle estudios para confirmar el diagn?stico y Paramedic otras afecciones. Las pruebas pueden incluir las siguientes: ?Pruebas de diagn?stico por im?genes, como una exploraci?n por tomograf?a computarizada (TC) o una resonancia magn?tica (RM). ?Extracci?n de Truddie Coco de tejido para analizar con un microscopio (biopsia). ??C?mo se trata? ?El tratamiento de esta afecci?n depende del tama?o del lipoma y si causa alg?n s?ntoma. ?Los lipomas peque?os que no causan problemas no requieren tratamiento. ?Si un lipoma se agranda o causa problemas, puede realizarse una cirug?a para extirpar el lipoma. Los lipomas  tambi?n pueden extirparse para mejorar el aspecto. En la mayor?a de los [place], el procedimiento se realiza despu?s de aplicar un medicamento que adormece el ?rea (anestesia local). ?Pueden realizarse una liposucci?n para reducir el tama?o del lipoma antes de que se lo extraigan quir?rgicamente, o bien se la Optometrist para extirpar el lipoma. Los lipomas se extirpan con este m?todo para limitar el tama?o de la incisi?n y las cicatrices. Se introduce un tubo de liposucci?n a trav?s de una peque?a incisi?n en el lipoma y el contenido del lipoma se extrae a trav?s del tubo mediante succi?n. ?Siga estas indicaciones en su casa: ?Controle el lipoma para detectar cambios. ?Concurra a [place]. Esto es importante. ?D?nde obtener m?s informaci?n ?OrthoInfo: orthoinfo.aaos.org ?Comun?quese con un m?dico si: ?El lipoma se agranda o se endurece. ?El lipoma comienza a causarle dolor, se enrojece o se hincha cada vez m?s. Estos podr?an ser signos de infecci?n o de una afecci?n m?s grave. ?Solicite ayuda de inmediato si: ?Siente hormigueo o adormecimiento en el ?rea cerca del lipoma. Esto podr?a indicar que el lipoma es lo que causa da?o nervioso. ?Resumen ?Un lipoma es un tumor no canceroso formado por c?lulas de grasa. ?La mayor?a de los lipomas no causan problemas y no requieren tratamiento. ?Si un lipoma se agranda o causa problemas, puede realizarse una cirug?a para extirpar el lipoma. ?Comun?quese con un m?dico si el lipoma se agranda o se endurece, o si empieza a dolor, se pone de color rojo o se hincha cada vez m?s. Estos podr?an ser signos de infecci?n o de una afecci?n m?s grave. ?Esta informaci?n no tiene Marine scientist el consejo del m?dico. Aseg?rese de hacerle al  m?dico cualquier pregunta que tenga. ?Document Revised: 12/10/2021 Document Reviewed: 12/10/2021 ?Elsevier Patient Education ? [place]. ? ? ?

## 2022-03-27 ENCOUNTER — Ambulatory Visit: Payer: Self-pay | Admitting: Surgery

## 2022-04-23 ENCOUNTER — Encounter (HOSPITAL_COMMUNITY): Payer: Self-pay

## 2022-04-23 ENCOUNTER — Emergency Department (HOSPITAL_COMMUNITY)
Admission: EM | Admit: 2022-04-23 | Discharge: 2022-04-23 | Disposition: A | Discharge status: Home / Self Care | Payer: Self-pay | Attending: Emergency Medicine | Admitting: Emergency Medicine

## 2022-04-23 DIAGNOSIS — R21 Rash and other nonspecific skin eruption: Secondary | ICD-10-CM | POA: Insufficient documentation

## 2022-04-23 MED ORDER — TRIAMCINOLONE ACETONIDE 0.5 % EX OINT: 1.0000 | TOPICAL_OINTMENT | Freq: Two times a day (BID) | CUTANEOUS | 0 refills | Status: DC

## 2022-04-23 MED ORDER — TRIAMCINOLONE ACETONIDE 0.5 % EX OINT
1.0000 | TOPICAL_OINTMENT | Freq: Two times a day (BID) | CUTANEOUS | 0 refills | Status: DC
Start: 2022-04-23 — End: 2022-04-23

## 2022-04-23 NOTE — ED Provider Notes (Signed)
  [facility] EMERGENCY DEPARTMENT Provider Note   CSN: [id number] Arrival date & time: 04/23/22  2133     History  Chief Complaint  Patient presents with   Rash    Meghan Reeves is a 41 y.o. female.  The history is provided by the patient. No language interpreter was used.  Rash Location:  Full body Quality: itchiness and redness   Severity:  Moderate Onset quality:  Gradual Duration:  2 days Timing:  Constant Context: insect bite/sting        Home Medications Prior to Admission medications   Medication Sig Start Date End Date Taking? Authorizing Provider  triamcinolone ointment (KENALOG) 0.5 % Apply 1 Application topically 2 (two) times daily. 04/23/22  Yes Caryl Ada K, PA-C  metoCLOPramide (REGLAN) 10 MG tablet TAKE 1 TABLET (10 MG TOTAL) BY MOUTH 3 (THREE) TIMES DAILY AS NEEDED FOR NAUSEA (OR HEADACHE). 02/25/22   Lomax, Amy, NP  rizatriptan (MAXALT) 10 MG tablet Take 1 tablet (10 mg total) by mouth as needed for migraine. May repeat in 2 hours if needed 02/25/22   Debbora Presto, NP      Allergies    Topamax [topiramate]    Review of Systems   Review of Systems  Skin:  Positive for rash.  All other systems reviewed and are negative.   Physical Exam Updated Vital Signs BP 124/78   Pulse 91   Temp 98.5 F (36.9 C) (Oral)   Resp 18   SpO2 98%  Physical Exam Vitals and nursing note reviewed.  Constitutional:      Appearance: She is well-developed.  HENT:     Head: Normocephalic.  Pulmonary:     Effort: Pulmonary effort is normal.  Abdominal:     General: There is no distension.  Musculoskeletal:        General: Normal range of motion.  Skin:    Comments: Multiple red raised areas most consistent with the bites, could be small areas of poison ivy  Neurological:     Mental Status: She is alert and oriented to person, place, and time.  Psychiatric:        Mood and Affect: Mood normal.     ED Results / Procedures /  Treatments   Labs (all labs ordered are listed, but only abnormal results are displayed) Labs Reviewed - No data to display  EKG None  Radiology No results found.  Procedures Procedures    Medications Ordered in ED Medications - No data to display  ED Course/ Medical Decision Making/ A&P                           Medical Decision Making Risk Prescription drug management.           Final Clinical Impression(s) / ED Diagnoses Final diagnoses:  Rash    Rx / DC Orders ED Discharge Orders          Ordered    triamcinolone ointment (KENALOG) 0.5 %  2 times daily        04/23/22 2154           An After Visit Summary was printed and given to the patient.    Sidney Ace 04/23/22 2157    Margette Fast, MD 04/23/22 [phone]

## 2022-04-23 NOTE — ED Triage Notes (Signed)
Pt states that she has had a rash for the past 2 days after working outside, rash is itchy.

## 2022-04-23 NOTE — Discharge Instructions (Addendum)
Return if any problems.

## 2022-05-31 ENCOUNTER — Other Ambulatory Visit: Payer: Self-pay

## 2022-05-31 ENCOUNTER — Emergency Department (HOSPITAL_COMMUNITY): Payer: Self-pay

## 2022-05-31 ENCOUNTER — Encounter (HOSPITAL_COMMUNITY): Payer: Self-pay | Admitting: Emergency Medicine

## 2022-05-31 ENCOUNTER — Emergency Department (HOSPITAL_COMMUNITY)
Admission: EM | Admit: 2022-05-31 | Discharge: 2022-05-31 | Disposition: A | Discharge status: Home / Self Care | Payer: Self-pay | Attending: Emergency Medicine | Admitting: Emergency Medicine

## 2022-05-31 DIAGNOSIS — R531 Weakness: Secondary | ICD-10-CM | POA: Insufficient documentation

## 2022-05-31 DIAGNOSIS — M542 Cervicalgia: Secondary | ICD-10-CM | POA: Insufficient documentation

## 2022-05-31 DIAGNOSIS — J45909 Unspecified asthma, uncomplicated: Secondary | ICD-10-CM | POA: Insufficient documentation

## 2022-05-31 DIAGNOSIS — R200 Anesthesia of skin: Secondary | ICD-10-CM | POA: Insufficient documentation

## 2022-05-31 DIAGNOSIS — Z87891 Personal history of nicotine dependence: Secondary | ICD-10-CM | POA: Insufficient documentation

## 2022-05-31 MED ORDER — KETOROLAC TROMETHAMINE 30 MG/ML IJ SOLN
15.0000 mg | Freq: Once | INTRAMUSCULAR | Status: AC
  Administered 2022-05-31: 15 mg via INTRAMUSCULAR
  Filled 2022-05-31: qty 1

## 2022-05-31 MED ORDER — IBUPROFEN 600 MG PO TABS: 600.0000 mg | ORAL_TABLET | Freq: Four times a day (QID) | ORAL | 0 refills | Status: DC | PRN

## 2022-05-31 MED ORDER — LIDOCAINE 5 % EX PTCH: 1.0000 | MEDICATED_PATCH | CUTANEOUS | 0 refills | Status: DC

## 2022-05-31 MED ORDER — LIDOCAINE 5 % EX PTCH
1.0000 | MEDICATED_PATCH | CUTANEOUS | Status: DC
  Administered 2022-05-31: 1 via TRANSDERMAL
  Filled 2022-05-31: qty 1

## 2022-05-31 NOTE — ED Provider Notes (Signed)
[facility] EMERGENCY DEPARTMENT Provider Note   CSN: [id number] Arrival date & time: 05/31/22  0116     History  Chief Complaint  Patient presents with   Neck Pain    Meghan Reeves is a 41 y.o. female.   Neck Pain  41 year old female presents emergency department with complaints of left-sided neck pain with associated numbness and left hand and stated weakness.  Patient states symptoms occurred approximately 3 days ago after awakening from sleep.  States that symptoms have been persistent since onset and has responded minimally to at home nonsteroidal therapy.  She has a history of similar presentation on the right side from 04/30/2021 without significant pain.  She recently seen for 2 small lipomas found on her left neck by general surgery who suggested observant management of lipoma and suggested anti-inflammatory/massage therapy for further treatment of left-sided neck pain 03/24/22.  Patient denies any new symptoms since seeing the general surgeon when pressing further.  She denies fever, chills, night sweats, chest pain, shortness of breath, abdominal pain, nausea/vomiting/diarrhea, urinary symptoms, bowel/bladder dysfunction, weakness/sensory deficits in lower extremities, saddle anesthesia.  Denies any new trauma.  Home Medications Prior to Admission medications   Medication Sig Start Date End Date Taking? Authorizing Provider  ibuprofen (ADVIL) 600 MG tablet Take 1 tablet (600 mg total) by mouth every 6 (six) hours as needed. 05/31/22  Yes Dion Saucier A, PA  lidocaine (LIDODERM) 5 % Place 1 patch onto the skin daily. Remove & Discard patch within 12 hours or as directed by MD 05/31/22  Yes Dion Saucier A, PA  metoCLOPramide (REGLAN) 10 MG tablet TAKE 1 TABLET (10 MG TOTAL) BY MOUTH 3 (THREE) TIMES DAILY AS NEEDED FOR NAUSEA (OR HEADACHE). 02/25/22   Lomax, Amy, NP  rizatriptan (MAXALT) 10 MG tablet Take 1 tablet (10 mg total) by mouth as needed  for migraine. May repeat in 2 hours if needed 02/25/22   Lomax, Amy, NP  triamcinolone ointment (KENALOG) 0.5 % Apply 1 Application topically 2 (two) times daily. 04/23/22   Fransico Meadow, PA-C      Allergies    Topamax [topiramate]    Review of Systems   Review of Systems  Musculoskeletal:  Positive for neck pain.  All other systems reviewed and are negative.   Physical Exam Updated Vital Signs BP 110/77 (BP Location: Left Arm)   Pulse 71   Temp 98 F (36.7 C) (Oral)   Resp 18   LMP 05/27/2022   SpO2 98%  Physical Exam Vitals and nursing note reviewed.  Constitutional:      General: She is not in acute distress.    Appearance: She is well-developed.  HENT:     Head: Normocephalic and atraumatic.  Eyes:     General:        Right eye: No discharge.        Left eye: No discharge.     Extraocular Movements: Extraocular movements intact.     Conjunctiva/sclera: Conjunctivae normal.     Pupils: Pupils are equal, round, and reactive to light.  Neck:      Comments: Patient tender to palpation left cervical paraspinal muscles -extends down left trapezial ridge.  No lipomatous tissue on exam.  Patient has full active range of motion of bilateral upper extremities.  Grip strength, elbow flexion/extension 5 out of 5 bilaterally.  Patient complains of decreased sensation in left hand.  No tenderness to palpation in affected extremity.  No overlying skin abnormalities noted.  No midline tenderness of cervical/thoracic spine.  Cardiovascular:     Rate and Rhythm: Normal rate and regular rhythm.     Heart sounds: No murmur heard. Pulmonary:     Effort: Pulmonary effort is normal. No respiratory distress.     Breath sounds: Normal breath sounds. No stridor. No wheezing or rales.  Abdominal:     Palpations: Abdomen is soft.     Tenderness: There is no abdominal tenderness. There is no guarding.  Musculoskeletal:        General: No swelling.     Cervical back: Full passive range of  motion without pain, normal range of motion and neck supple. Tenderness present. No edema, erythema, rigidity or crepitus.     Right lower leg: No edema.     Left lower leg: No edema.  Skin:    General: Skin is warm and dry.     Capillary Refill: Capillary refill takes less than 2 seconds.  Neurological:     Mental Status: She is alert.     Cranial Nerves: No dysarthria or facial asymmetry.     Motor: No weakness or pronator drift.     Coordination: Coordination is intact. Romberg sign negative. Finger-Nose-Finger Test and Heel to [place] Test normal.     Comments: Cranial nerves III through XII grossly intact.  Muscle strength out of 5 for upper and lower extremities.  DTRs symmetric and equal.  No sensory deficits on major nerve distributions of lower extremities.  Psychiatric:        Mood and Affect: Mood normal.     ED Results / Procedures / Treatments   Labs (all labs ordered are listed, but only abnormal results are displayed) Labs Reviewed - No data to display  EKG None  Radiology CT Cervical Spine Wo Contrast  Result Date: 05/31/2022 CLINICAL DATA:  Chronic neck pain worse left-sided. EXAM: CT CERVICAL SPINE WITHOUT CONTRAST TECHNIQUE: Multidetector CT imaging of the cervical spine was performed without intravenous contrast. Multiplanar CT image reconstructions were also generated. RADIATION DOSE REDUCTION: This exam was performed according to the departmental dose-optimization program which includes automated exposure control, adjustment of the mA and/or kV according to patient size and/or use of iterative reconstruction technique. COMPARISON:  None Available. FINDINGS: Alignment: Normal. Skull base and vertebrae: Vertebral body heights are normal. There is mild spondylosis of the mid to lower cervical spine. Atlantoaxial articulation is normal. No significant neural foraminal narrowing. Mildly prominent posterior osteophyte at the C5-6 level. No fracture or subluxation. Soft  tissues and spinal canal: No prevertebral fluid or swelling. No visible canal hematoma. Disc levels:  Minimal disc space narrowing at the C5-6 level. Upper chest: No acute findings. Other: None. IMPRESSION: 1. No acute findings. 2. 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 Electronically Signed   By: Marin Olp M.D.   On: 05/31/2022 12:22    Procedures Procedures    Medications Ordered in ED Medications  lidocaine (LIDODERM) 5 % 1 patch (1 patch Transdermal Patch Applied 05/31/22 1212)  ketorolac (TORADOL) 30 MG/ML injection 15 mg (15 mg Intramuscular Given 05/31/22 1212)    ED Course/ Medical Decision Making/ A&P                           Medical Decision Making Amount and/or Complexity of Data Reviewed Radiology: ordered.  Risk Prescription drug management.   This patient presents to the ED  for concern of neck pain, this involves an extensive number of treatment options, and is a complaint that carries with it a high risk of complications and morbidity.  The differential diagnosis includes malignancy, tumor, meningitis, fracture, muscular strain, dislocation, trans myelitis,    Co morbidities that complicate the patient evaluation  Asthma, migraine history, Hypertriglyceridemia, obesity,    Additional history obtained:  Additional history obtained from EMR External records from outside source obtained and reviewed including ultrasound soft tissue from 03/06/2022 indicating size and location of said lipoma   Lab Tests:  N/a   Imaging Studies ordered:  I ordered imaging studies including CT C-spine I independently visualized and interpreted imaging which showed no acute findings.  Mild spondylosis of the mid to lower cervical spine with minimal disc narrowing at C5-C6 I agree with the radiologist interpretation  Cardiac Monitoring: / EKG:  The patient was maintained on a cardiac monitor.  I personally  viewed and interpreted the cardiac monitored which showed an underlying rhythm of: Sinus rhythm   Consultations Obtained:  N/a   Problem List / ED Course / Critical interventions / Medication management  Neck pain I ordered medication including Toradol and Lidoderm for pain   Reevaluation of the patient after these medicines showed that the patient improved I have reviewed the patients home medicines and have made adjustments as needed   Social Determinants of Health:  Former cigarette smoker quit in 2019.  Denies illicit drug use.   Test / Admission - Considered:  Neck pain Vitals signs  within normal range and stable throughout visit. Imaging studies significant for: See above Patient's symptoms likely secondary to muscular strain/spasm given point tenderness to palpation, lack of trauma and reassuring CT findings.  Patient noted significant improvement with administration of IM Toradol and Lidoderm patch.  Doubt CVA at this time given chronicity of injury as well as negative work-ups in the past.  CT imaging of the head not pursued for this purpose.  Patient also seen by PCP as well as general surgeon recently managing her symptoms appropriately. Treatment plan was discussed with patient, the patient knowledge understanding of said plan and was agreeable.  Symptomatic therapy to be continued with topical Lidoderm patches as well as ibuprofen orally.  Patient recommended follow-up with PCP in 3-5 days for reevaluation of his symptoms. Worrisome signs and symptoms were discussed with the patient, and the patient acknowledged understanding to return to the ED if noticed. Patient was stable upon discharge.         Final Clinical Impression(s) / ED Diagnoses Final diagnoses:  Neck pain    Rx / DC Orders ED Discharge Orders          Ordered    lidocaine (LIDODERM) 5 %  Every 24 hours        05/31/22 1245    ibuprofen (ADVIL) 600 MG tablet  Every 6 hours PRN        05/31/22  1245              Wilnette Kales, [place] 05/31/22 1510    Godfrey Pick, MD 06/02/22 [phone]

## 2022-05-31 NOTE — ED Notes (Signed)
Pt back from CT. Alert, NAD, calm, interactive, resps e/u, speaking in clear complete sentences.

## 2022-05-31 NOTE — ED Provider Triage Note (Signed)
Emergency Medicine Provider Triage Evaluation Note  Meghan Reeves , a 41 y.o. female  was evaluated in triage.  Pt complains of left sided neck/arm pain.  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  States pain is worse now.  Denies focal numbness/weakness but does have tingling of left arm.  Review of Systems  Positive: Left arm pain Negative: Numbness/weakness  Physical Exam  BP (!) 139/100 (BP Location: Right Arm)   Pulse 93   Temp 98.2 F (36.8 C) (Oral)   Resp 18   SpO2 97%  Gen:   Awake, no distress   Resp:  Normal effort  MSK:   Moves extremities without difficulty  Other:  Lipomas to left side of neck, full ROM of neck, freely moving left arm, normal grips  Medical Decision Making  Medically screening exam initiated at 1:28 AM.  Appropriate orders placed.  Meghan Reeves was informed that the remainder of the evaluation will be completed by another provider, this initial triage assessment does not replace that evaluation, and the importance of remaining in the ED until their evaluation is complete.  Left arm/neck pain.  Known hx of lipomas confirmed by [place] and has been referred to general surgery for removal.  No new injury/trauma.  No focal deficits in triage   Larene Pickett, PA-C 05/31/22 0132

## 2022-05-31 NOTE — ED Notes (Signed)
EDPA at [facility] with [employer] video interpreter

## 2022-05-31 NOTE — Discharge Instructions (Addendum)
Your work-up today was overall negative for any acute abnormality.  Still recommend nonsteroidal therapy with ibuprofen.  I sent some 600 mg tablets into your pharmacy to take as needed for neck pain.  I have also sent in the numbing patches to use once a day.  I think you would benefit greatly from massage therapy if you are able to schedule an appointment.  Please follow-up with your PCP regarding your symptoms within 3 to 5 days for reevaluation.  Please do not hesitate to return to emergency department if the worrisome signs and symptoms we discussed become apparent.

## 2022-05-31 NOTE — ED Triage Notes (Signed)
Patient reports left lateral neck pain radiating down to left shoulder/arm and tingling at left hand onset last week . Denies injury .

## 2022-11-27 ENCOUNTER — Other Ambulatory Visit: Payer: Self-pay

## 2023-02-17 ENCOUNTER — Emergency Department (HOSPITAL_COMMUNITY): Payer: Self-pay

## 2023-02-17 ENCOUNTER — Emergency Department (HOSPITAL_COMMUNITY)
Admission: EM | Admit: 2023-02-17 | Discharge: 2023-02-17 | Disposition: A | Discharge status: Home / Self Care | Payer: Self-pay | Attending: Emergency Medicine | Admitting: Emergency Medicine

## 2023-02-17 ENCOUNTER — Other Ambulatory Visit: Payer: Self-pay

## 2023-02-17 DIAGNOSIS — I10 Essential (primary) hypertension: Secondary | ICD-10-CM | POA: Insufficient documentation

## 2023-02-17 DIAGNOSIS — E119 Type 2 diabetes mellitus without complications: Secondary | ICD-10-CM | POA: Insufficient documentation

## 2023-02-17 DIAGNOSIS — D72829 Elevated white blood cell count, unspecified: Secondary | ICD-10-CM | POA: Insufficient documentation

## 2023-02-17 DIAGNOSIS — R072 Precordial pain: Secondary | ICD-10-CM | POA: Insufficient documentation

## 2023-02-17 LAB — CBC
HCT: 37.5 % (ref 36.0–46.0)
Hemoglobin: 13.4 g/dL (ref 12.0–15.0)
MCH: 31.2 pg (ref 26.0–34.0)
MCHC: 35.7 g/dL (ref 30.0–36.0)
MCV: 87.4 fL (ref 80.0–100.0)
Platelets: 300 10*3/uL (ref 150–400)
RBC: 4.29 MIL/uL (ref 3.87–5.11)
RDW: 12 % (ref 11.5–15.5)
WBC: 11 10*3/uL — ABNORMAL HIGH (ref 4.0–10.5)
nRBC: 0 % (ref 0.0–0.2)

## 2023-02-17 LAB — TROPONIN I (HIGH SENSITIVITY)
Troponin I (High Sensitivity): 2 ng/L (ref <18)
Troponin I (High Sensitivity): 3 ng/L (ref <18)

## 2023-02-17 LAB — BASIC METABOLIC PANEL
Anion gap: 11 (ref 5–15)
BUN: 8 mg/dL (ref 6–20)
CO2: 22 mmol/L (ref 22–32)
Calcium: 8.8 mg/dL — ABNORMAL LOW (ref 8.9–10.3)
Chloride: 103 mmol/L (ref 98–111)
Creatinine, Ser: 0.82 mg/dL (ref 0.44–1.00)
GFR, Estimated: >60 mL/min (ref >60)
Glucose, Bld: 117 mg/dL — ABNORMAL HIGH (ref 70–99)
Potassium: 4 mmol/L (ref 3.5–5.1)
Sodium: 136 mmol/L (ref 135–145)

## 2023-02-17 LAB — I-STAT BETA HCG BLOOD, ED (MC, WL, AP ONLY): I-stat hCG, quantitative: <5 m[IU]/mL (ref <5)

## 2023-02-17 MED ORDER — HYDROXYZINE HCL 25 MG PO TABS
25.0000 mg | ORAL_TABLET | Freq: Once | ORAL | Status: AC
Start: 2023-02-17 — End: 2023-02-17
  Administered 2023-02-17: 25 mg via ORAL
  Filled 2023-02-17: qty 1

## 2023-02-17 MED ORDER — HYDROXYZINE HCL 25 MG PO TABS: 25.0000 mg | ORAL_TABLET | Freq: Four times a day (QID) | ORAL | 0 refills | Status: AC
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
## 2023-02-17 NOTE — Discharge Instructions (Signed)
Lab was reassuring, give you medication called Vistaril help with anxiety please take as prescribed  Please follow-up with your primary doctor for further assessment  Come back to the emergency department if you develop chest pain, shortness of breath, severe abdominal pain, uncontrolled nausea, vomiting, diarrhea.
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
## 2023-03-15 IMAGING — CT CT HEAD W/O CM
4 series · 17 of 47 positions shown, 19 images · non-contrast
Comparison: 09/22/2018

CLINICAL DATA: Neurologic deficit. Acute. Right arm pain and
tingling radiating to neck.

EXAM:
CT HEAD WITHOUT CONTRAST
TECHNIQUE: Contiguous axial images were obtained from the base of the skull
through the vertex without intravenous contrast.

[Series 3: head wo · axial · 0.39mm/px · z∈[+1378,+1498]mm · 7 of 32 slices shown, 9 images]
[im 4/32  brain]
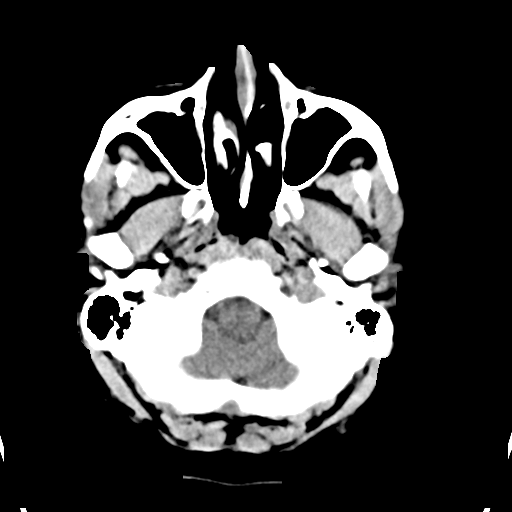
[im 4/32  bone]
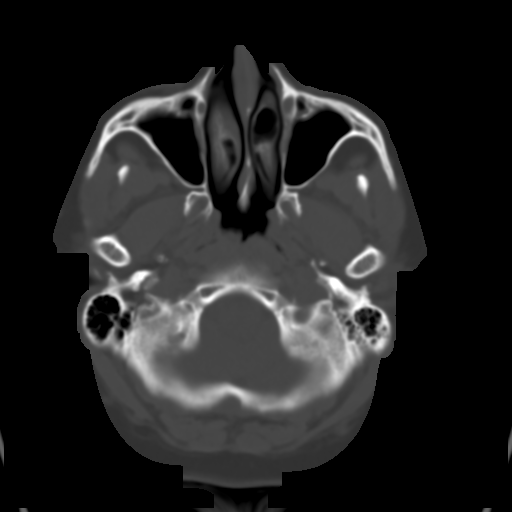
[im 8/32  brain]
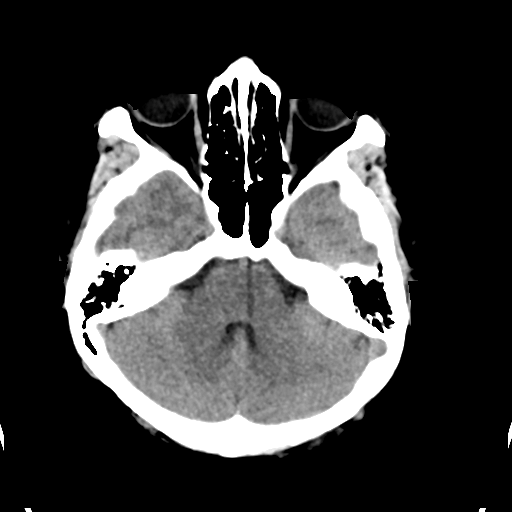
[im 12/32  brain]
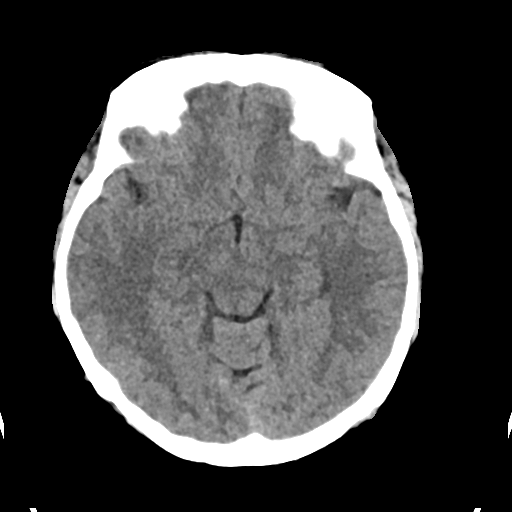
[im 16/32  brain]
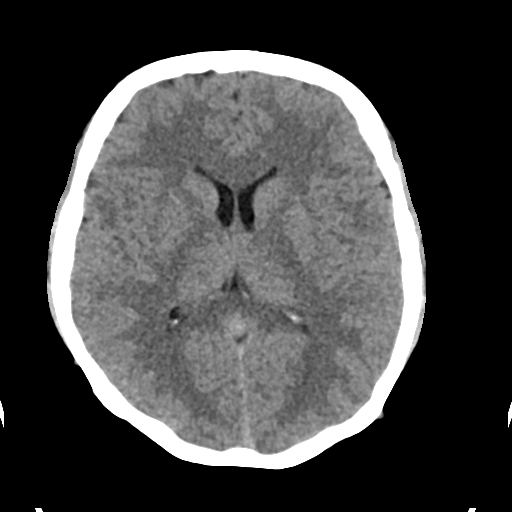
[im 20/32  brain]
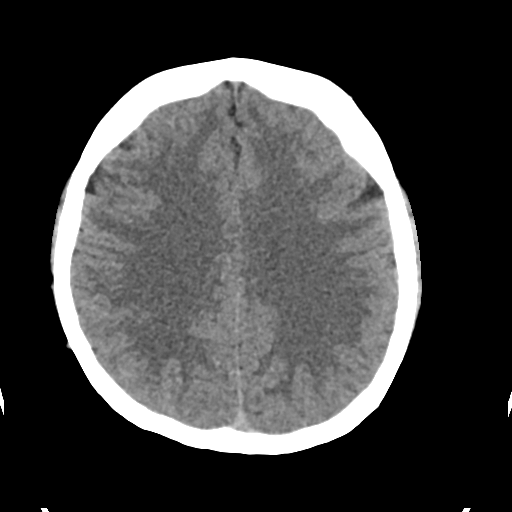
[im 20/32  bone]
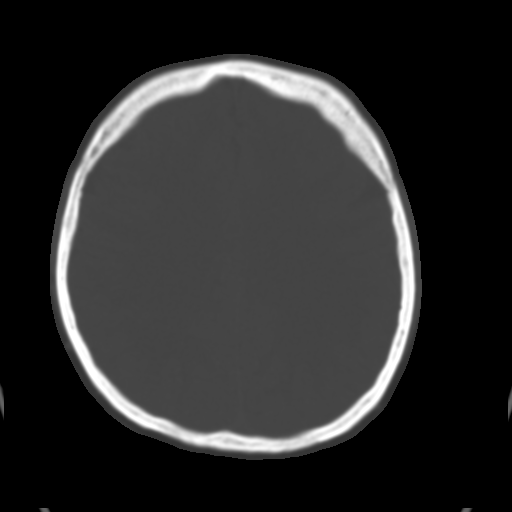
[im 24/32  brain]
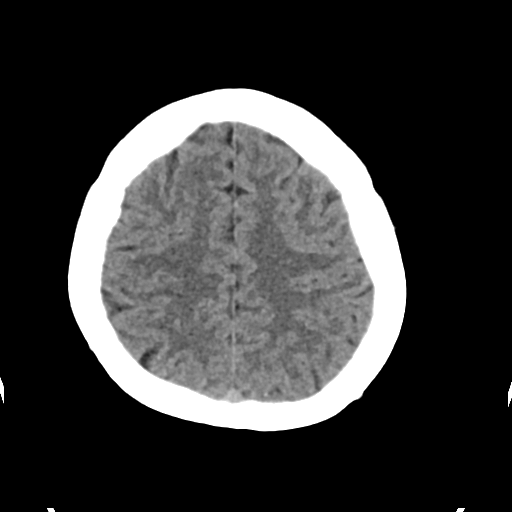
[im 28/32  brain]
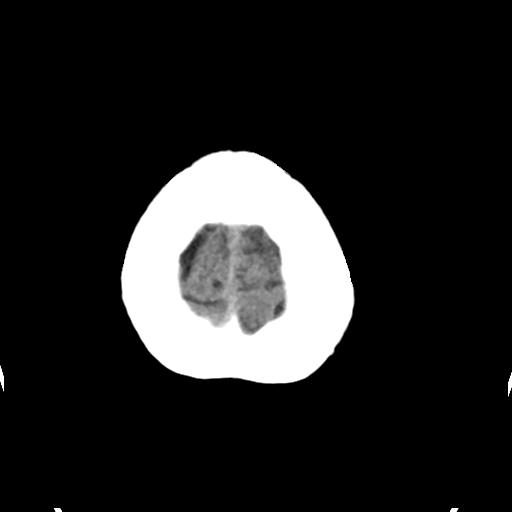

[Series 4: head bone · axial · 0.39mm/px · z∈[+1376,+1432]mm · 4 of 80 slices shown]
[im 8/80  bone]
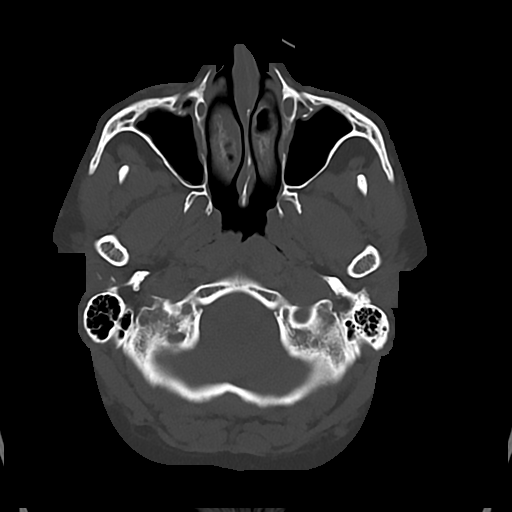
[im 16/80  bone]
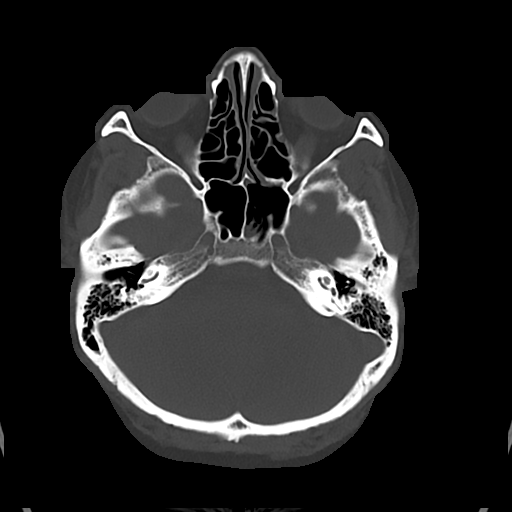
[im 24/80  bone]
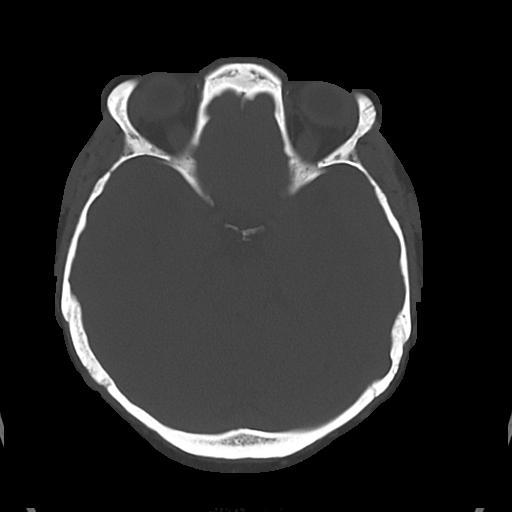
[im 36/80  bone]
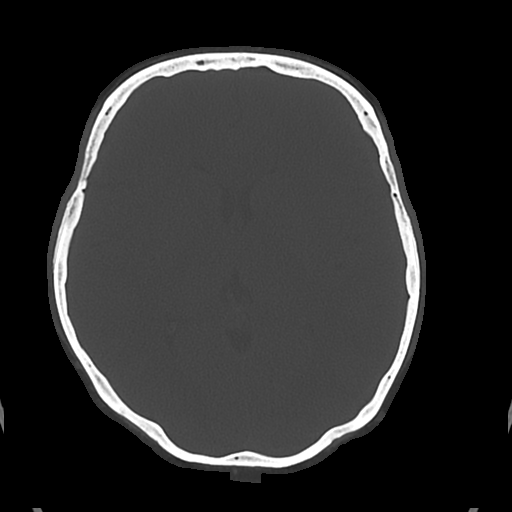

[Series 5: cor soft · coronal · 0.30mm/px · 3 of 64 slices shown]
[im 22/64  brain]
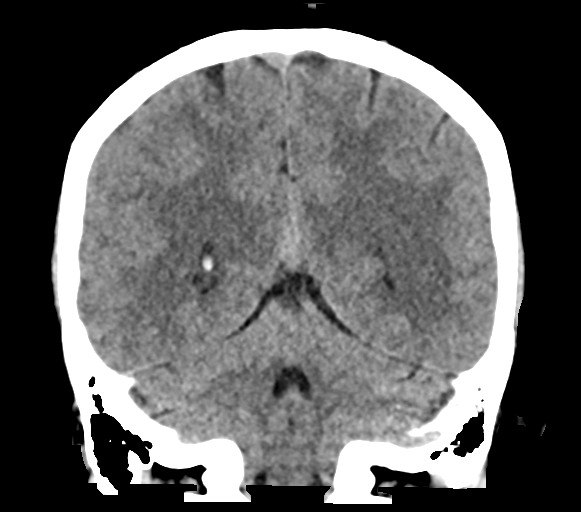
[im 29/64  brain]
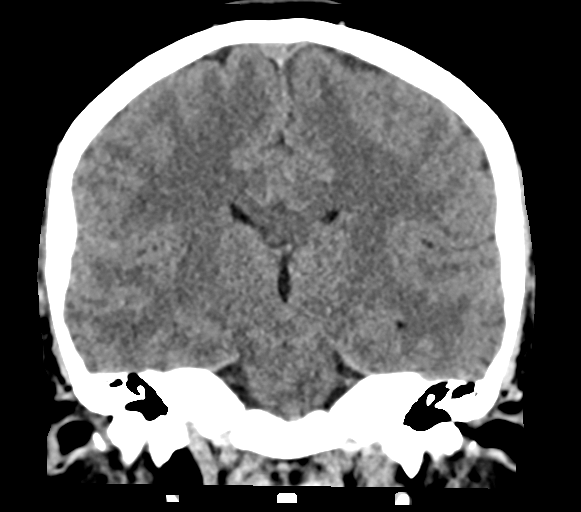
[im 36/64  brain]
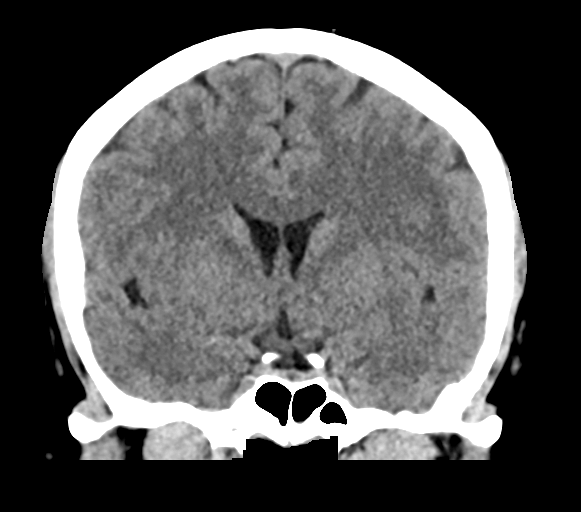

[Series 6: sag soft · sagittal · 0.30mm/px · 3 of 59 slices shown]
[im 20/59  brain]
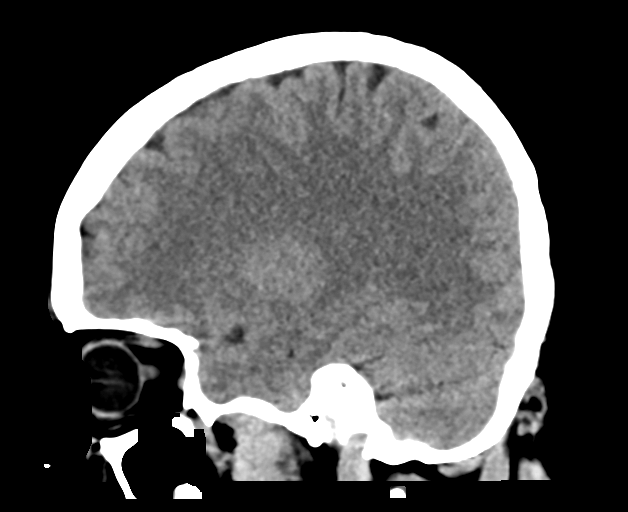
[im 30/59  brain]
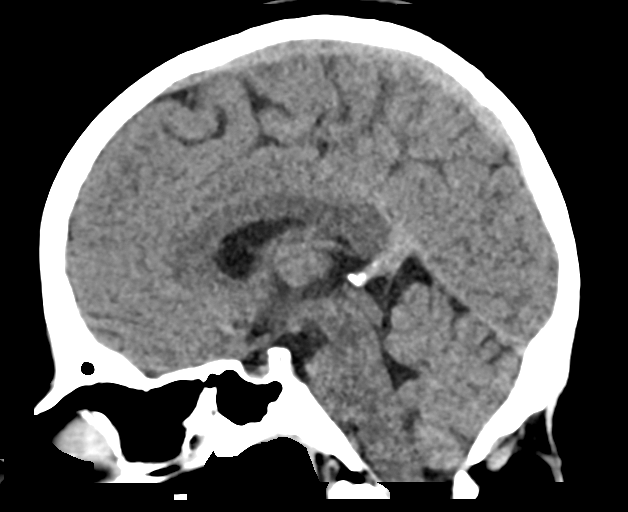
[im 39/59  brain]
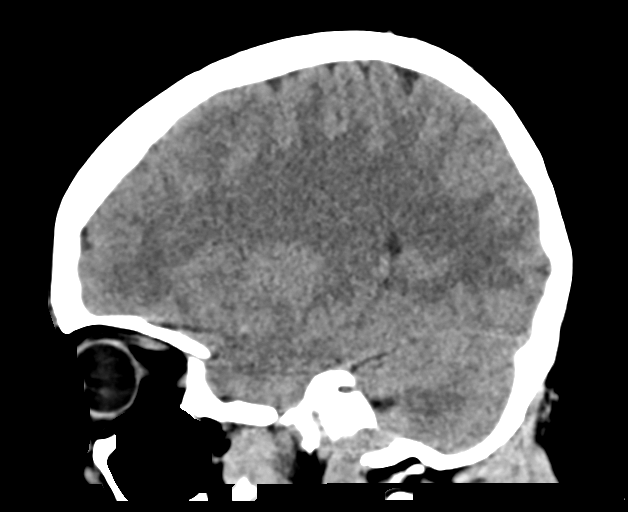

[17 of 47 positions shown; findings below may reference images not displayed]

FINDINGS: Brain: No evidence of acute infarction, hemorrhage, hydrocephalus,
extra-axial collection or mass lesion/mass effect.

Vascular: No hyperdense vessel or unexpected calcification.

Skull: Normal. Negative for fracture or focal lesion.

Sinuses/Orbits: Mild mucosal thickening involving the maxillary
sinuses and sphenoid sinus. No sinus fluid levels. Mastoid air cells
appear clear

Other: None.
IMPRESSION: 1. No acute intracranial abnormalities. Normal brain.
2. Mild sinus inflammation.

## 2023-09-26 ENCOUNTER — Encounter (HOSPITAL_COMMUNITY): Payer: Self-pay | Admitting: Emergency Medicine

## 2023-09-26 ENCOUNTER — Emergency Department (HOSPITAL_COMMUNITY)
Admission: EM | Admit: 2023-09-26 | Discharge: 2023-09-27 | Disposition: A | Discharge status: Home / Self Care | Payer: Self-pay | Attending: Emergency Medicine | Admitting: Emergency Medicine

## 2023-09-26 DIAGNOSIS — N390 Urinary tract infection, site not specified: Secondary | ICD-10-CM | POA: Insufficient documentation

## 2023-09-26 DIAGNOSIS — B3731 Acute candidiasis of vulva and vagina: Secondary | ICD-10-CM | POA: Insufficient documentation

## 2023-09-26 DIAGNOSIS — J45909 Unspecified asthma, uncomplicated: Secondary | ICD-10-CM | POA: Insufficient documentation

## 2023-09-26 LAB — URINALYSIS, ROUTINE W REFLEX MICROSCOPIC
Bilirubin Urine: NEGATIVE
Glucose, UA: NEGATIVE mg/dL
Hgb urine dipstick: NEGATIVE
Ketones, ur: NEGATIVE mg/dL
Nitrite: NEGATIVE
Protein, ur: NEGATIVE mg/dL
Specific Gravity, Urine: 1.019 (ref 1.005–1.030)
pH: 6 (ref 5.0–8.0)

## 2023-09-26 LAB — CBC
HCT: 37.8 % (ref 36.0–46.0)
Hemoglobin: 13.1 g/dL (ref 12.0–15.0)
MCH: 31 pg (ref 26.0–34.0)
MCHC: 34.7 g/dL (ref 30.0–36.0)
MCV: 89.6 fL (ref 80.0–100.0)
Platelets: 293 10*3/uL (ref 150–400)
RBC: 4.22 MIL/uL (ref 3.87–5.11)
RDW: 11.9 % (ref 11.5–15.5)
WBC: 10.1 10*3/uL (ref 4.0–10.5)
nRBC: 0 % (ref 0.0–0.2)

## 2023-09-26 LAB — COMPREHENSIVE METABOLIC PANEL
ALT: 19 U/L (ref 0–44)
AST: 22 U/L (ref 15–41)
Albumin: 3.4 g/dL — ABNORMAL LOW (ref 3.5–5.0)
Alkaline Phosphatase: 68 U/L (ref 38–126)
Anion gap: 7 (ref 5–15)
BUN: 12 mg/dL (ref 6–20)
CO2: 22 mmol/L (ref 22–32)
Calcium: 8.7 mg/dL — ABNORMAL LOW (ref 8.9–10.3)
Chloride: 105 mmol/L (ref 98–111)
Creatinine, Ser: 0.72 mg/dL (ref 0.44–1.00)
GFR, Estimated: >60 mL/min (ref >60)
Glucose, Bld: 115 mg/dL — ABNORMAL HIGH (ref 70–99)
Potassium: 3.9 mmol/L (ref 3.5–5.1)
Sodium: 134 mmol/L — ABNORMAL LOW (ref 135–145)
Total Bilirubin: 0.6 mg/dL (ref <1.2)
Total Protein: 6.7 g/dL (ref 6.5–8.1)

## 2023-09-26 LAB — HCG, SERUM, QUALITATIVE: Preg, Serum: NEGATIVE

## 2023-09-26 NOTE — ED Triage Notes (Signed)
Pt here from home with c/o vaginal pain and itching also burns upon urination ,some discharge and bad odor

## 2023-09-27 LAB — WET PREP, GENITAL
Clue Cells Wet Prep HPF POC: NONE SEEN
Sperm: NONE SEEN
Trich, Wet Prep: NONE SEEN
WBC, Wet Prep HPF POC: >=10 — AB (ref <10)

## 2023-09-27 MED ORDER — FLUCONAZOLE 150 MG PO TABS: 150.0000 mg | ORAL_TABLET | Freq: Every day | ORAL | 0 refills | Status: DC

## 2023-09-27 MED ORDER — CEPHALEXIN 500 MG PO CAPS: 500.0000 mg | ORAL_CAPSULE | Freq: Two times a day (BID) | ORAL | 0 refills | Status: DC

## 2023-09-27 MED ORDER — CEPHALEXIN 500 MG PO CAPS: 500.0000 mg | ORAL_CAPSULE | Freq: Two times a day (BID) | ORAL | 0 refills | Status: AC

## 2023-09-27 NOTE — Discharge Instructions (Signed)
Take Keflex as prescribed and complete the full course. Take Diflucan for 1 dose for vaginal yeast infection. This should help with the itching.  Follow up with your doctor.    Tome Keflex segn lo prescrito y complete el tratamiento completo. Tome Diflucan por 1 dosis para la candidiasis vaginal. Esto debera ayudar con la picazn.  Haga un seguimiento con su mdico.

## 2023-09-27 NOTE — ED Provider Notes (Signed)
[place] EMERGENCY DEPARTMENT AT [facility] Provider Note   CSN: [id number] Arrival date & time: 09/26/23  2122     History  Chief Complaint  Patient presents with   Vaginal Itching    Meghan Reeves is a 42 y.o. female.  42 year old female with complaint of vaginal infection itching, irritation, dysuria, onset 2 weeks ago, has apt with her provider in December but couldn't wait any longer. Also vaginal discharge with odor. No recent antibiotics, no new sexual partners (married x 15 years). Denies abdominal pain, fever, vomiting.   A language interpreter was used ([place]).  Vaginal Itching       Home Medications Prior to Admission medications   Medication Sig Start Date End Date Taking? Authorizing Provider  cephALEXin (KEFLEX) 500 MG capsule Take 1 capsule (500 mg total) by mouth 2 (two) times daily for 3 days. 09/27/23 09/30/23 Yes Jeannie Fend, PA-C  fluconazole (DIFLUCAN) 150 MG tablet Take 1 tablet (150 mg total) by mouth daily. 09/27/23  Yes Jeannie Fend, PA-C  ibuprofen (ADVIL) 600 MG tablet Take 1 tablet (600 mg total) by mouth every 6 (six) hours as needed. 05/31/22   Sherian Maroon A, PA  lidocaine (LIDODERM) 5 % Place 1 patch onto the skin daily. Remove & Discard patch within 12 hours or as directed by MD 05/31/22   Peter Garter, PA  metoCLOPramide (REGLAN) 10 MG tablet TAKE 1 TABLET (10 MG TOTAL) BY MOUTH 3 (THREE) TIMES DAILY AS NEEDED FOR NAUSEA (OR HEADACHE). 02/25/22   Lomax, Amy, NP  rizatriptan (MAXALT) 10 MG tablet Take 1 tablet (10 mg total) by mouth as needed for migraine. May repeat in 2 hours if needed 02/25/22   Lomax, Amy, NP  triamcinolone ointment (KENALOG) 0.5 % Apply 1 Application topically 2 (two) times daily. 04/23/22   Elson Areas, PA-C      Allergies    Topamax [topiramate]    Review of Systems   Review of Systems Negative except as per HPI Physical Exam Updated Vital Signs BP 118/81   Pulse 87    Temp 98.6 F (37 C)   Resp 17   LMP 09/12/2023 (Approximate)   SpO2 100%  Physical Exam Vitals and nursing note reviewed.  Constitutional:      General: She is not in acute distress.    Appearance: She is well-developed. She is not diaphoretic.  HENT:     Head: Normocephalic and atraumatic.  Pulmonary:     Effort: Pulmonary effort is normal.  Abdominal:     Palpations: Abdomen is soft.     Tenderness: There is no abdominal tenderness.  Skin:    General: Skin is warm and dry.  Neurological:     Mental Status: She is alert and oriented to person, place, and time.  Psychiatric:        Behavior: Behavior normal.     ED Results / Procedures / Treatments   Labs (all labs ordered are listed, but only abnormal results are displayed) Labs Reviewed  WET PREP, GENITAL - Abnormal; Notable for the following components:      Result Value   Yeast Wet Prep HPF POC PRESENT (*)    WBC, Wet Prep HPF POC >=10 (*)    All other components within normal limits  URINALYSIS, ROUTINE W REFLEX MICROSCOPIC - Abnormal; Notable for the following components:   APPearance HAZY (*)    Leukocytes,Ua LARGE (*)    Bacteria, UA RARE (*)  All other components within normal limits  COMPREHENSIVE METABOLIC PANEL - Abnormal; Notable for the following components:   Sodium 134 (*)    Glucose, Bld 115 (*)    Calcium 8.7 (*)    Albumin 3.4 (*)    All other components within normal limits  HCG, SERUM, QUALITATIVE  CBC  GC/CHLAMYDIA PROBE AMP ([place]) NOT AT [facility]    EKG None  Radiology No results found.  Procedures Procedures    Medications Ordered in ED Medications - No data to display  ED Course/ Medical Decision Making/ A&P                                 Medical Decision Making Amount and/or Complexity of Data Reviewed Labs: ordered.  Risk Prescription drug management.   This patient presents to the ED for concern of vaginal itching and dysuria, this involves an extensive  number of treatment options, and is a complaint that carries with it a high risk of complications and morbidity.  The differential diagnosis includes UTI, yeast, STI   Co morbidities that complicate the patient evaluation  Asthma, obesity    Additional history obtained:  External records from outside source obtained and reviewed including prior labs on file for comparison    Lab Tests:  I Ordered, and personally interpreted labs.  The pertinent results include:  wet prep + for yeast. UA with possible UTI.  hCG negative.  CBC and CMP without significant findings.  GC chlamydia sent out   Problem List / ED Course / Critical interventions / Medication management  42 year old female presents with complaint of vaginal itching and dysuria.  Her abdomen is soft and nontender, she is afebrile and well-appearing.  Labs reveal likely vaginal yeast infection, possible UTI.  Will cover with Diflucan and Keflex.  Recommend recheck with PCP. I have reviewed the patients home medicines and have made adjustments as needed   Social Determinants of Health:  Has PCP   Test / Admission - Considered:  Stable for discharge         Final Clinical Impression(s) / ED Diagnoses Final diagnoses:  Yeast vaginitis  Urinary tract infection without hematuria, site unspecified    Rx / DC Orders ED Discharge Orders          Ordered    cephALEXin (KEFLEX) 500 MG capsule  2 times daily        09/27/23 0325    fluconazole (DIFLUCAN) 150 MG tablet  Daily        09/27/23 0325              Jeannie Fend, PA-C 09/27/23 0330    Zadie Rhine, MD 09/27/23 [phone]

## 2023-09-27 NOTE — ED Notes (Signed)
Pt in NAD at d/c from ED. A&O. Ambulatory. Respirations even & unlabored. Skin warm & dry. Pt verbalized understanding of d/c teaching including follow up care, medications and how to take them, and reasons to return to the ED. No needs or questions expressed at d/c. D/c teaching reviewed using Spanish interpreter # [phone].

## 2023-10-12 ENCOUNTER — Other Ambulatory Visit (HOSPITAL_COMMUNITY)
Admission: RE | Admit: 2023-10-12 | Discharge: 2023-10-12 | Disposition: A | Discharge status: Home / Self Care | Payer: Self-pay | Source: Ambulatory Visit | Attending: Family | Admitting: Family

## 2023-10-12 ENCOUNTER — Encounter: Payer: Self-pay | Admitting: Family

## 2023-10-12 ENCOUNTER — Ambulatory Visit (INDEPENDENT_AMBULATORY_CARE_PROVIDER_SITE_OTHER): Payer: Self-pay | Admitting: Family

## 2023-10-12 VITALS — BP 114/80 | HR 81 | Temp 97.8°F | Ht 61.5 in | Wt 204.0 lb

## 2023-10-12 DIAGNOSIS — R635 Abnormal weight gain: Secondary | ICD-10-CM

## 2023-10-12 DIAGNOSIS — B3731 Acute candidiasis of vulva and vagina: Secondary | ICD-10-CM

## 2023-10-12 DIAGNOSIS — Z7689 Persons encountering health services in other specified circumstances: Secondary | ICD-10-CM

## 2023-10-12 DIAGNOSIS — Z13220 Encounter for screening for lipoid disorders: Secondary | ICD-10-CM

## 2023-10-12 DIAGNOSIS — Z758 Other problems related to medical facilities and other health care: Secondary | ICD-10-CM

## 2023-10-12 DIAGNOSIS — Z603 Acculturation difficulty: Secondary | ICD-10-CM

## 2023-10-12 DIAGNOSIS — Z6837 Body mass index (BMI) 37.0-37.9, adult: Secondary | ICD-10-CM

## 2023-10-12 MED ORDER — PHENTERMINE HCL 15 MG PO CAPS: 15.0000 mg | ORAL_CAPSULE | ORAL | 0 refills | Status: DC

## 2023-10-12 NOTE — Progress Notes (Signed)
Patient wants to know about her triglycerides and cholesterol.

## 2023-10-12 NOTE — Progress Notes (Signed)
Patient ID: Meghan Reeves, female    DOB: 1981-04-15  MRN: [id number]  CC: Follow-Up  Subjective: Meghan Reeves is a 42 y.o. female who presents for follow-up.  Her concerns today include:  - Reports recently diagnosed with candida vaginitis. Completed prescription for treatment. Denies current symptoms. Requests repeat screening on today.  - Routine cholesterol screening.  - Reports would like to lose 20 pounds. She does not watch what she eats. She does not exercise. She would like a referral to weight specialist.   Patient Active Problem List   Diagnosis Date Noted   Musculoskeletal disorder involving upper trapezius muscle 03/24/2022   Lipoma of neck 03/24/2022   Hypertriglyceridemia 02/25/2022   Class 2 obesity due to excess calories with body mass index (BMI) of 35.0 to 35.9 in adult 02/25/2022   Bacterial vaginosis 01/09/2021   Candida vaginitis 01/09/2021   Status post laparoscopic cholecystectomy 05/14/2020   Diastasis recti 04/25/2020   Nexplanon in place 12/14/2019   Migraine without aura and with status migrainosus, not intractable 12/08/2018   Medication overuse headache 12/08/2018   Tobacco use disorder, continuous 02/03/2017   Encounter for smoking cessation counseling 02/03/2017   Spanish speaking patient 10/01/2016     Current Outpatient Medications on File Prior to Visit  Medication Sig Dispense Refill   ibuprofen (ADVIL) 600 MG tablet Take 1 tablet (600 mg total) by mouth every 6 (six) hours as needed. 30 tablet 0   lidocaine (LIDODERM) 5 % Place 1 patch onto the skin daily. Remove & Discard patch within 12 hours or as directed by MD 30 patch 0   metoCLOPramide (REGLAN) 10 MG tablet TAKE 1 TABLET (10 MG TOTAL) BY MOUTH 3 (THREE) TIMES DAILY AS NEEDED FOR NAUSEA (OR HEADACHE). 15 tablet 11   rizatriptan (MAXALT) 10 MG tablet Take 1 tablet (10 mg total) by mouth as needed for migraine. May repeat in 2 hours if needed 10 tablet 11   No  current facility-administered medications on file prior to visit.    Allergies  Allergen Reactions   Topamax [Topiramate] Other (See Comments)    Blurry vision, numbness of lips    Social History   Socioeconomic History   Marital status: Significant Other    Spouse name: Not on file   Number of children: 1   Years of education: Not on file   Highest education level: 6th grade  Occupational History   Not on file  Tobacco Use   Smoking status: Former    Current packs/day: 0.00    Average packs/day: 0.3 packs/day for 10.0 years (2.5 ttl pk-yrs)    Types: Cigarettes    Start date: 04/06/2008    Quit date: 04/06/2018    Years since quitting: 5.5   Smokeless tobacco: Never  Vaping Use   Vaping status: Never Used  Substance and Sexual Activity   Alcohol use: No    Alcohol/week: 0.0 standard drinks of alcohol   Drug use: No   Sexual activity: Yes    Birth control/protection: None  Other Topics Concern   Not on file  Social History Narrative   ** Merged History Encounter **       Lives at home with boyfriend  Right handed Caffeine: 30 oz daily of coca cola   Social Determinants of Health   Financial Resource Strain: Not on file  Food Insecurity: Not on file  Transportation Needs: Not on file  Physical Activity: Not on file  Stress: Not on file  Social  Connections: Not on file  Intimate Partner Violence: Not on file    Family History  Problem Relation Age of Onset   Asthma Mother    Migraines Sister     Past Surgical History:  Procedure Laterality Date   APPENDECTOMY  09/2016   CESAREAN SECTION N/A 09/07/2017   Procedure: CESAREAN SECTION;  Surgeon: Catalina Antigua, MD;  Location: [facility];  Service: Obstetrics;  Laterality: N/A;   LAPAROSCOPIC APPENDECTOMY N/A 09/29/2016   Procedure: APPENDECTOMY LAPAROSCOPIC;  Surgeon: Gaynelle Adu, MD;  Location: [facility];  Service: General;  Laterality: N/A;   NO PAST SURGERIES     VENTRAL HERNIA REPAIR N/A  04/29/2020   Procedure: HERNIA REPAIR VENTRAL ADULT, Open;  Surgeon: Campbell Lerner, MD;  Location: [facility];  Service: General;  Laterality: N/A;    ROS: Review of Systems Negative except as stated above  PHYSICAL EXAM: BP 114/80   Pulse 81   Temp 97.8 F (36.6 C) (Oral)   Ht 5' 1.5" (1.562 m)   Wt 204 lb (92.5 kg)   LMP 10/08/2023 (Exact Date)   SpO2 96%   BMI 37.92 kg/m   Physical Exam HENT:     Head: Normocephalic and atraumatic.     Nose: Nose normal.     Mouth/Throat:     Mouth: Mucous membranes are moist.     Pharynx: Oropharynx is clear.  Eyes:     Extraocular Movements: Extraocular movements intact.     Conjunctiva/sclera: Conjunctivae normal.     Pupils: Pupils are equal, round, and reactive to light.  Cardiovascular:     Rate and Rhythm: Normal rate and regular rhythm.     Pulses: Normal pulses.     Heart sounds: Normal heart sounds.  Pulmonary:     Effort: Pulmonary effort is normal.     Breath sounds: Normal breath sounds.  Musculoskeletal:        General: Normal range of motion.     Cervical back: Normal range of motion and neck supple.  Neurological:     General: No focal deficit present.     Mental Status: She is alert and oriented to person, place, and time.  Psychiatric:        Mood and Affect: Mood normal.        Behavior: Behavior normal.     ASSESSMENT AND PLAN: 1. Candida vaginitis - Routine screening.  - Cervicovaginal ancillary only - POCT URINALYSIS DIP (CLINITEK); Future  2. Screening cholesterol level - Routine screening.  - Lipid panel  3. Encounter for weight management 4. BMI 37.0-37.9, adult 5. Weight gain - Phentermine as prescribed. Counseled on medication adherence and adverse effects.  - I did check the [facility] prescription drug database. - Counseled on low-sodium DASH diet and 150 minutes of moderate intensity exercise per week as tolerated to assist with weight management.  - Referral to Medical Weight  Management for evaluation/management. During the interim follow-up with primary provider in 4 weeks or sooner if needed until established with referral. - phentermine 15 MG capsule; Take 1 capsule (15 mg total) by mouth every morning.  Dispense: 30 capsule; Refill: 0 - Amb Ref to Medical Weight Management  6. Language barrier - [employer]. ID#: [id number]    Patient was given the opportunity to ask questions.  Patient verbalized understanding of the plan and was able to repeat key elements of the plan. Patient was given clear instructions to go to Emergency Department or return to medical center if  symptoms don't improve, worsen, or new problems develop.The patient verbalized understanding.   Orders Placed This Encounter  Procedures   Lipid panel   Amb Ref to Medical Weight Management   POCT URINALYSIS DIP (CLINITEK)     Requested Prescriptions   Signed Prescriptions Disp Refills   phentermine 15 MG capsule 30 capsule 0    Sig: Take 1 capsule (15 mg total) by mouth every morning.    Follow-up with primary provider as scheduled.   Rema Fendt, NP

## 2023-10-13 ENCOUNTER — Other Ambulatory Visit: Payer: Self-pay | Admitting: Family

## 2023-10-13 DIAGNOSIS — B3731 Acute candidiasis of vulva and vagina: Secondary | ICD-10-CM

## 2023-10-13 LAB — LIPID PANEL
Chol/HDL Ratio: 4.4 {ratio} (ref 0.0–4.4)
Cholesterol, Total: 167 mg/dL (ref 100–199)
HDL: 38 mg/dL — ABNORMAL LOW (ref >39)
LDL Chol Calc (NIH): 109 mg/dL — ABNORMAL HIGH (ref 0–99)
Triglycerides: 110 mg/dL (ref 0–149)
VLDL Cholesterol Cal: 20 mg/dL (ref 5–40)

## 2023-10-13 LAB — CERVICOVAGINAL ANCILLARY ONLY
Bacterial Vaginitis (gardnerella): NEGATIVE
Candida Glabrata: NEGATIVE
Candida Vaginitis: POSITIVE — AB
Chlamydia: NEGATIVE
Comment: NEGATIVE
Comment: NEGATIVE
Comment: NEGATIVE
Comment: NEGATIVE
Comment: NEGATIVE
Comment: NORMAL
Neisseria Gonorrhea: NEGATIVE
Trichomonas: NEGATIVE

## 2023-10-13 MED ORDER — FLUCONAZOLE 150 MG PO TABS: 150.0000 mg | ORAL_TABLET | ORAL | 0 refills | Status: AC

## 2023-10-14 ENCOUNTER — Telehealth: Payer: Self-pay

## 2023-10-14 ENCOUNTER — Telehealth: Payer: Self-pay | Admitting: Internal Medicine

## 2023-10-14 NOTE — Telephone Encounter (Signed)
-----   Message from Nurse Eustace Pen B sent at 10/14/2023 10:20 AM EST -----  ----- Message ----- From: Rema Fendt, NP Sent: 10/13/2023   4:09 PM EST To: Claudie Leach, CMA  Call patient with update. Candida vaginitis prescribed. Fluconazole prescribed.

## 2023-10-14 NOTE — Telephone Encounter (Signed)
Copied from CRM [id number]. Topic: General - Call Back - No Documentation >> Oct 14, 2023 11:24 AM Macon Large wrote: Reason for CRM: Pt stated that she received a call to go over the lab results so she was returning the call.

## 2023-10-14 NOTE — Telephone Encounter (Signed)
Pt was called and vm was left, Information has been sent to nurse pool.    Interpreter id # [id number]

## 2023-10-15 NOTE — Telephone Encounter (Signed)
Called patient DOB was confirmed and results given      interpreter ID # [id number]

## 2023-12-14 ENCOUNTER — Encounter: Payer: Self-pay | Admitting: Dietician

## 2023-12-14 ENCOUNTER — Encounter: Payer: Self-pay | Attending: Family | Admitting: Dietician

## 2023-12-14 VITALS — Wt 198.7 lb

## 2023-12-14 DIAGNOSIS — R635 Abnormal weight gain: Secondary | ICD-10-CM | POA: Insufficient documentation

## 2023-12-14 NOTE — Progress Notes (Signed)
 Medical Nutrition Therapy  Appointment Start time:  42  Appointment End time:  42  Primary concerns today: general nutrition, healthy lifestyle   Referral diagnosis: R63.5 Preferred learning style: no preference indicated Learning readiness: ready   NUTRITION ASSESSMENT   Anthropometrics   Wt: 198.7 lb  Clinical Medical Hx: asthma, HLD Medications: reviewed, phentermine  Labs: 10/12/23 HDL 38, LDL 109,  Notable Signs/Symptoms: none reported Food Allergies: none reported  Lifestyle & Dietary Hx  [place] interpretor services utilized for this visit.   Pt states she was 204 lb but has lost to 198.7 lb today since recently starting phentermine  3 weeks ago. Pt states she also reduced flour, sugar, and coke intake. Pt states her appetite has decreased. Pt reports prior to this she was drinking 4 cans of coke per day and not only has some with lunch.   Pt reports she stopped eating after 9pm, or may just have water or an apple. Pt reports she has trouble falling asleep, and deals with insomnia. Pt states she averages 5-6 hours of sleep.   Pt reports she wakes up at 9am, and since starting phentermine  and her own diet a few weeks ago she usually only eats lunch and then may have a protein bar or avocado toast later on.   Pt states she is stressed. 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 Pt states her husband does not work on Sundays, and he takes care of their son that day so she plans to try and walk on Sundays for exercise and stress relief.    Estimated daily fluid intake: 64 oz Supplements: none Sleep: 5-6 hours, pt states she has insomnia Stress / self-care: moderate to high stress Current average weekly physical activity: ADLs  24-Hr Dietary Recall First Meal: none Snack: none Second Meal: egg with whole wheat bread with a little bit of coke Snack: protein bar OR avocado toast Third Meal: none Snack: apple Beverages:  water, small amount of coke   NUTRITION DIAGNOSIS  NB-1.1 Food and nutrition-related knowledge deficit As related to lack of prior education by a registered dietitian.  As evidenced by pt report.   NUTRITION INTERVENTION  Nutrition education (E-1) on the following topics:   Plate Method Fruits & Vegetables: Aim to fill half your plate with a variety of fruits and vegetables. They are rich in vitamins, minerals, and fiber, and can help reduce the risk of chronic diseases. Choose a colorful assortment of fruits and vegetables to ensure you get a wide range of nutrients. Grains and Starches: Make at least half of your grain choices whole grains, such as brown rice, whole wheat bread, and oats. Whole grains provide fiber, which aids in digestion and healthy cholesterol levels. Aim for whole forms of starchy vegetables such as potatoes, sweet potatoes, beans, peas, and corn, which are fiber rich and provide many vitamins and minerals.  Protein: Incorporate lean sources of protein, such as poultry, fish, beans, nuts, and seeds, into your meals. Protein is essential for building and repairing tissues, staying full, balancing blood sugar, as well as supporting immune function. Dairy: Include low-fat or fat-free dairy products like milk, yogurt, and cheese in your diet. Dairy foods are excellent sources of calcium  and vitamin D, which are crucial for bone health.   Exercise Finding an exercise you enjoy is crucial for maintaining long-term fitness and overall health. Enjoyable activities are more likely to become regular habits, making it easier to  stay consistent with physical activity. When you look forward to your workouts, exercise becomes a positive experience rather than a chore, reducing the likelihood of burnout or quitting. Enjoyable exercise also enhances mental well-being, as engaging in activities you love can boost mood, reduce stress, and provide a sense of accomplishment.  Sleep Impact of  sleep on nutrition: Inadequate sleep can have significant impacts on nutrition by increasing appetite and cravings for high-calorie foods, altering food choices, and disrupting metabolic processes. This can lead to weight gain, poor glucose management, and impaired nutrient absorption. Additionally, sleep deprivation often results in reduced physical activity, further affecting overall health. Tips for better sleep: To improve sleep quality, maintain a consistent sleep schedule and create a relaxing bedtime routine. Optimize your sleep environment by keeping it cool, dark, and quiet, and limit screen time before bed. Be mindful of food and drink intake, engage in regular physical activity, and manage stress effectively. By prioritizing better sleep habits, you can positively influence your nutrition and overall well-being.   Handouts Provided Include  Balanced Snacks MyPlate  Learning Style & Readiness for Change Teaching method utilized: Visual & Auditory  Demonstrated degree of understanding via: Teach Back  Barriers to learning/adherence to lifestyle change: none  Goals Established by Pt  Goal: go to the park on Sunday's and go walking.   Other tips discussed:   Aim to eat within 1-2 hours of waking up and every 3-5 hours following.   When snacking, aim to include a complex carb and protein.  At meals, aim to include 1/2 plate non-starchy vegetables, 1/4 plate protein, and 1/4 plate complex carbs.    MONITORING & EVALUATION Dietary intake, weekly physical activity, and follow up in 2 months.  Next Steps  Patient is to call for questions.

## 2023-12-22 ENCOUNTER — Other Ambulatory Visit: Payer: Self-pay | Admitting: Family

## 2023-12-22 DIAGNOSIS — Z6837 Body mass index (BMI) 37.0-37.9, adult: Secondary | ICD-10-CM

## 2023-12-22 DIAGNOSIS — Z7689 Persons encountering health services in other specified circumstances: Secondary | ICD-10-CM

## 2023-12-22 DIAGNOSIS — R635 Abnormal weight gain: Secondary | ICD-10-CM

## 2023-12-22 NOTE — Telephone Encounter (Signed)
Copied from CRM [id number]. Topic: Clinical - Medication Refill >> Dec 22, 2023  2:36 PM Fuller Mandril wrote: Most Recent Primary Care Visit:  Provider: Ricky Stabs J  Department: [facility]  Visit Type: OFFICE VISIT  Date: 10/12/2023  Medication: phentermine 15 MG capsule  Has the patient contacted their pharmacy? Yes  (Agent: If no, request that the patient contact the pharmacy for the refill. If patient does not wish to contact the pharmacy document the reason why and proceed with request.) (Agent: If yes, when and what did the pharmacy advise?) need authorization from provider   Is this the correct pharmacy for this prescription? Yes If no, delete pharmacy and type the correct one.  This is the patient's preferred pharmacy:  [id number] [place], [place] - [phone] AT [facility] OF [facility] & [address] [place] [address] Phone: [phone] Fax: [phone]   Has the prescription been filled recently? No 11/08/2023  Is the patient out of the medication? Yes   Has the patient been seen for an appointment in the last year OR does the patient have an upcoming appointment? Yes 10/12/2023  Can we respond through MyChart? No  Agent: Please be advised that Rx refills may take up to 3 business days. We ask that you follow-up with your pharmacy.

## 2023-12-22 NOTE — Telephone Encounter (Signed)
Last Fill: 10/12/23  Last OV: 10/12/23 Next OV: None Scheduled  Routing to provider for review/authorization.

## 2023-12-25 ENCOUNTER — Other Ambulatory Visit: Payer: Self-pay | Admitting: Family

## 2023-12-25 DIAGNOSIS — Z6837 Body mass index (BMI) 37.0-37.9, adult: Secondary | ICD-10-CM

## 2023-12-25 DIAGNOSIS — Z7689 Persons encountering health services in other specified circumstances: Secondary | ICD-10-CM

## 2023-12-25 DIAGNOSIS — R635 Abnormal weight gain: Secondary | ICD-10-CM

## 2023-12-28 ENCOUNTER — Telehealth: Payer: Self-pay

## 2023-12-28 NOTE — Telephone Encounter (Signed)
 Routing to office. This patient does not have PCP list. Seen last by Ricky Stabs, NP. Please advise.

## 2023-12-28 NOTE — Telephone Encounter (Signed)
 Copied from CRM [id number]. Topic: Clinical - Prescription Issue >> Dec 28, 2023 10:49 AM Antony Haste wrote: Reason for CRM: PT would like to know if phentermine 15 MG capsule can be signed off by the end of today. Advised it may take up to 72 hours for Rx refills. The patient is requesting to receive a callback once this has been signed off to her pharmacy.  Callback #: [phone]

## 2023-12-28 NOTE — Telephone Encounter (Signed)
Called pt and left vm to call office back to schedule.

## 2023-12-31 ENCOUNTER — Telehealth: Payer: Self-pay | Admitting: Family

## 2023-12-31 ENCOUNTER — Ambulatory Visit (INDEPENDENT_AMBULATORY_CARE_PROVIDER_SITE_OTHER): Payer: Self-pay | Admitting: Family

## 2023-12-31 ENCOUNTER — Encounter: Payer: Self-pay | Admitting: Family

## 2023-12-31 ENCOUNTER — Other Ambulatory Visit: Payer: Self-pay | Admitting: Family

## 2023-12-31 VITALS — BP 126/80 | HR 100 | Temp 98.6°F | Resp 14 | Ht 61.0 in | Wt 196.0 lb

## 2023-12-31 DIAGNOSIS — N6489 Other specified disorders of breast: Secondary | ICD-10-CM

## 2023-12-31 DIAGNOSIS — Z758 Other problems related to medical facilities and other health care: Secondary | ICD-10-CM

## 2023-12-31 DIAGNOSIS — Z7689 Persons encountering health services in other specified circumstances: Secondary | ICD-10-CM

## 2023-12-31 NOTE — Telephone Encounter (Signed)
 Epic is not allowing me to sign Phentermine prescription. Dr. Alvis Lemmings if you will please refill for patient due to Dr. Andrey Campanile is out of office. Thank you.

## 2023-12-31 NOTE — Progress Notes (Signed)
 Subjective:    Meghan Reeves - 43 y.o. female MRN [id number]  Date of birth: 22-Nov-1980  HPI  Meghan Reeves is to establish care.   Current issues and/or concerns: - Requests referral for breast reduction.  - Established with Neurology.  - No further issues/concerns for discussion today.   ROS per HPI    Health Maintenance:   Health Maintenance Due  Topic Date Due   COVID-19 Vaccine (1 - 2024-25 season) Never done     Past Medical History: Patient Active Problem List   Diagnosis Date Noted   Musculoskeletal disorder involving upper trapezius muscle 03/24/2022   Lipoma of neck 03/24/2022   Hypertriglyceridemia 02/25/2022   Class 2 obesity due to excess calories with body mass index (BMI) of 35.0 to 35.9 in adult 02/25/2022   Bacterial vaginosis 01/09/2021   Candida vaginitis 01/09/2021   Status post laparoscopic cholecystectomy 05/14/2020   Diastasis recti 04/25/2020   Nexplanon in place 12/14/2019   Migraine without aura and with status migrainosus, not intractable 12/08/2018   Medication overuse headache 12/08/2018   Tobacco use disorder, continuous 02/03/2017   Encounter for smoking cessation counseling 02/03/2017   Spanish speaking patient 10/01/2016      Social History   reports that she quit smoking about 5 years ago. Her smoking use included cigarettes. She started smoking about 15 years ago. She has a 2.5 pack-year smoking history. She has never used smokeless tobacco. She reports that she does not drink alcohol and does not use drugs.   Family History  family history includes Asthma in her mother; Migraines in her sister.   Medications: reviewed and updated   Objective:   Physical Exam BP 126/80 (BP Location: Right Arm, Patient Position: Sitting, Cuff Size: Large)   Pulse 100   Temp 98.6 F (37 C) (Oral)   Resp 14   Ht 5\' 1"  (1.549 m)   Wt 196 lb (88.9 kg)   SpO2 99%   BMI 37.03 kg/m   Physical Exam HENT:     Head:  Normocephalic and atraumatic.     Nose: Nose normal.     Mouth/Throat:     Mouth: Mucous membranes are moist.     Pharynx: Oropharynx is clear.  Eyes:     Extraocular Movements: Extraocular movements intact.     Conjunctiva/sclera: Conjunctivae normal.     Pupils: Pupils are equal, round, and reactive to light.  Cardiovascular:     Rate and Rhythm: Normal rate and regular rhythm.     Pulses: Normal pulses.     Heart sounds: Normal heart sounds.  Pulmonary:     Effort: Pulmonary effort is normal.     Breath sounds: Normal breath sounds.  Musculoskeletal:        General: Normal range of motion.     Cervical back: Normal range of motion and neck supple.  Neurological:     General: No focal deficit present.     Mental Status: She is alert and oriented to person, place, and time.  Psychiatric:        Mood and Affect: Mood normal.        Behavior: Behavior normal.        Assessment & Plan:  1. Encounter to establish care (Primary) - Patient presents today to establish care. During the interim follow-up with primary provider as scheduled.  - Return for annual physical examination, labs, and health maintenance. Arrive fasting meaning having no food for at least 8 hours prior to  appointment. You may have only water or black coffee. Please take scheduled medications as normal.  2. Bilateral pendulous breasts - Referral to Plastic Surgery for evaluation/management. - Ambulatory referral to Plastic Surgery  3. Language barrier - [place] in-person interpreter, Vladimir Faster.    Patient was given clear instructions to go to Emergency Department or return to medical center if symptoms don't improve, worsen, or new problems develop.The patient verbalized understanding.  I discussed the assessment and treatment plan with the patient. The patient was provided an opportunity to ask questions and all were answered. The patient agreed with the plan and demonstrated an understanding of the  instructions.   The patient was advised to call back or seek an in-person evaluation if the symptoms worsen or if the condition fails to improve as anticipated.    Ricky Stabs, NP 12/31/2023, 1:39 PM Primary Care at [facility]

## 2023-12-31 NOTE — Telephone Encounter (Signed)
 Patient was called about her prescription refill. Patient was concern as to why her medication phentermine 15 MG capsul  was not at pharmacy from 10/2023 visit. I explain to patient that medication would not be sitting up there that long. Patient said  that she would go back to doctor and explain  to  her that she is wanting to take weight loss medication. Patient said  she would  make another appt.  Due to language barrier, an interpreter was used.  Interpreter name or [place] Interpreter ID #--Micaela [id number], Reason for encounter--.  ~Sherlon Nied E, RMA

## 2023-12-31 NOTE — Telephone Encounter (Signed)
 Pt is requesting the medication for phentermine to be resent.

## 2024-01-03 ENCOUNTER — Other Ambulatory Visit: Payer: Self-pay | Admitting: Family

## 2024-01-03 DIAGNOSIS — Z7689 Persons encountering health services in other specified circumstances: Secondary | ICD-10-CM

## 2024-01-03 DIAGNOSIS — Z6837 Body mass index (BMI) 37.0-37.9, adult: Secondary | ICD-10-CM

## 2024-01-03 DIAGNOSIS — R635 Abnormal weight gain: Secondary | ICD-10-CM

## 2024-01-03 MED ORDER — PHENTERMINE HCL 30 MG PO CAPS: 30.0000 mg | ORAL_CAPSULE | ORAL | 0 refills | Status: DC

## 2024-01-03 NOTE — Telephone Encounter (Signed)
 Complete

## 2024-01-03 NOTE — Telephone Encounter (Signed)
 I typically do not prescribe phentermine for my patients.  You might have to print this out for her to pick up.

## 2024-01-10 ENCOUNTER — Ambulatory Visit: Payer: Self-pay | Admitting: Family

## 2024-01-10 ENCOUNTER — Other Ambulatory Visit: Payer: Self-pay | Admitting: Family

## 2024-01-10 DIAGNOSIS — Z7689 Persons encountering health services in other specified circumstances: Secondary | ICD-10-CM

## 2024-01-10 MED ORDER — PHENTERMINE HCL 15 MG PO CAPS: 15.0000 mg | ORAL_CAPSULE | ORAL | 0 refills | Status: DC

## 2024-01-10 NOTE — Telephone Encounter (Signed)
 Complete

## 2024-01-10 NOTE — Telephone Encounter (Signed)
 Interpreter ID # [id number] Chief Complaint: heart racing after taking increased dose of phentermine 30 mg . Requesting decrease dose to 15 mg  Symptoms: heart racing and feeling anxious and nervous after taking increased dose of medication  Frequency: 2 days ago Pertinent Negatives: Patient denies chest pain no difficulty breathing no heart racing or palpitations now  Disposition: [] ED /[] Urgent Care (no appt availability in office) / [] Appointment(In office/virtual)/ []  [facility]/ [] Home Care/ [] Refused Recommended Disposition /[] [facility]/ [x]  Follow-up with PCP Additional Notes:   Please advise if phentermine can be decreased back to 15 mg due to side effects of elevated HR. Patient reports when she was taking 15 mg for one month no side effects noted. Please send 15 mg dose to [facility] [address]. Patient requesting a call back when dose change has been sent to pharmacy . Copied from CRM [id number]. Topic: Clinical - Red Word Triage >> Jan 10, 2024  9:46 AM Nyra Capes wrote: Red Word that prompted transfer to Nurse Triage: Patient taking phentermine 30 MG capsule taking 2 days this is hight does, patient is having heart problems. Reason for Disposition  Palpitations  Answer Assessment - Initial Assessment Questions 1. DESCRIPTION: "Please describe your heart rate or heartbeat that you are having" (e.g., fast/slow, regular/irregular, skipped or extra beats, "palpitations")     Faster  and feels anxious, nervous  2. ONSET: "When did it start?" (Minutes, hours or days)      2 days ago after increase dose of phentermine  3. DURATION: "How long does it last" (e.g., seconds, minutes, hours)     Gone now since stopped medication  4. PATTERN "Does it come and go, or has it been constant since it started?"  "Does it get worse with exertion?"   "Are you feeling it now?"     na 5. TAP: "Using your hand, can you tap out what you are feeling on a chair or table in  front of you, so that I can hear?" (Note: not all patients can do this)       na 6. HEART RATE: "Can you tell me your heart rate?" "How many beats in 15 seconds?"  (Note: not all patients can do this)       na 7. RECURRENT SYMPTOM: "Have you ever had this before?" If Yes, ask: "When was the last time?" and "What happened that time?"      no 8. CAUSE: "What do you think is causing the palpitations?"     Increase dose of medication  9. CARDIAC HISTORY: "Do you have any history of heart disease?" (e.g., heart attack, angina, bypass surgery, angioplasty, arrhythmia)      na 10. OTHER SYMPTOMS: "Do you have any other symptoms?" (e.g., dizziness, chest pain, sweating, difficulty breathing)       None now  11. PREGNANCY: "Is there any chance you are pregnant?" "When was your last menstrual period?"       na  Protocols used: Heart Rate and Heartbeat Questions-A-AH

## 2024-01-19 IMAGING — US US SOFT TISSUE HEAD/NECK
1 series · 7 of 7 positions shown · non-contrast
Comparison: None.

CLINICAL DATA: Tender mobile nodule involving the posterior aspect
of the left side of the neck.

EXAM:
ULTRASOUND OF HEAD/NECK SOFT TISSUES
TECHNIQUE: Ultrasound examination of the head and neck soft tissues was
performed in the area of clinical concern.

[Series 1: us soft tissue head/neck · 0.06mm/px · 7 of 7 slices shown]
[im 1/7]
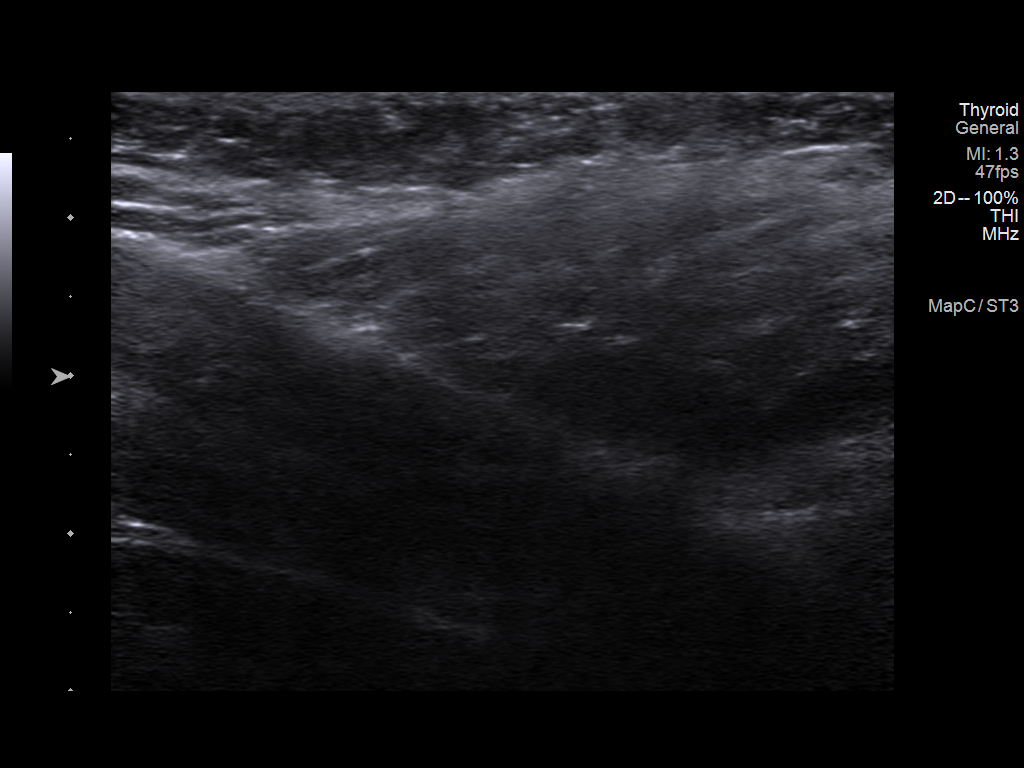
[im 2/7]
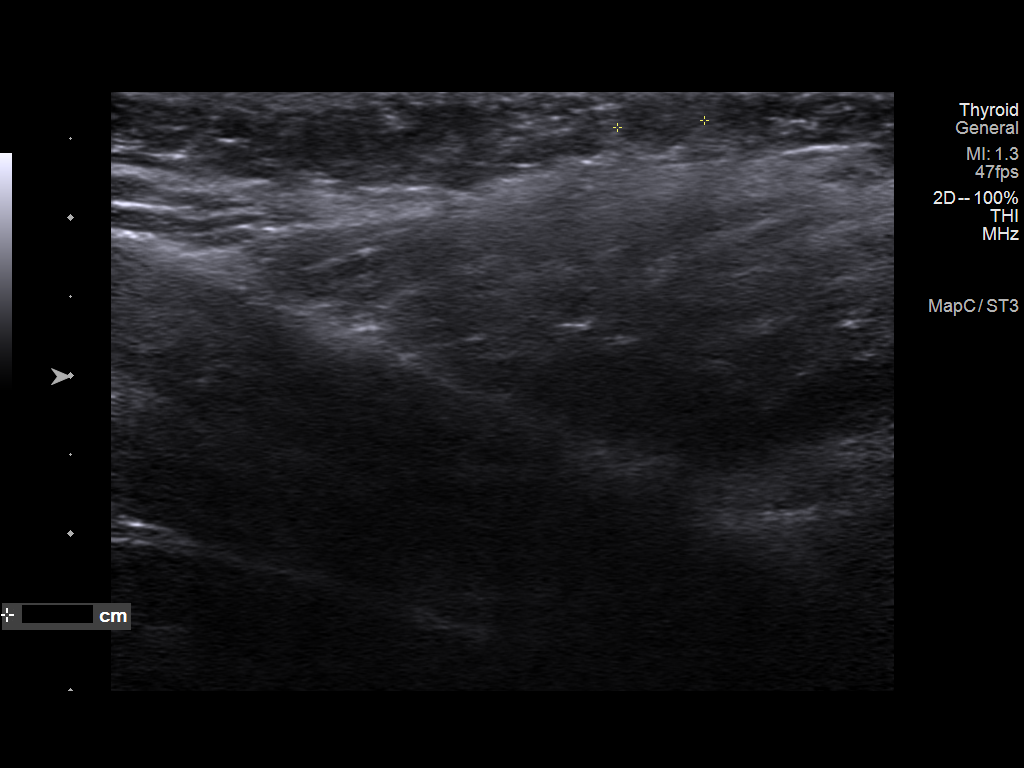
[im 3/7]
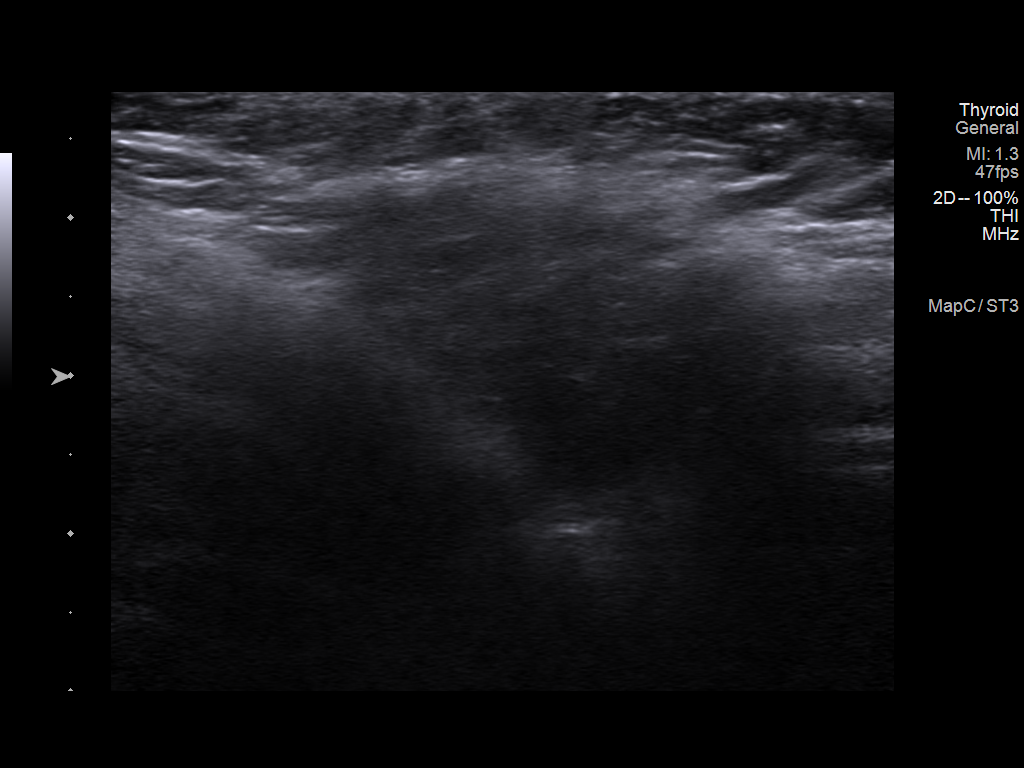
[im 4/7]
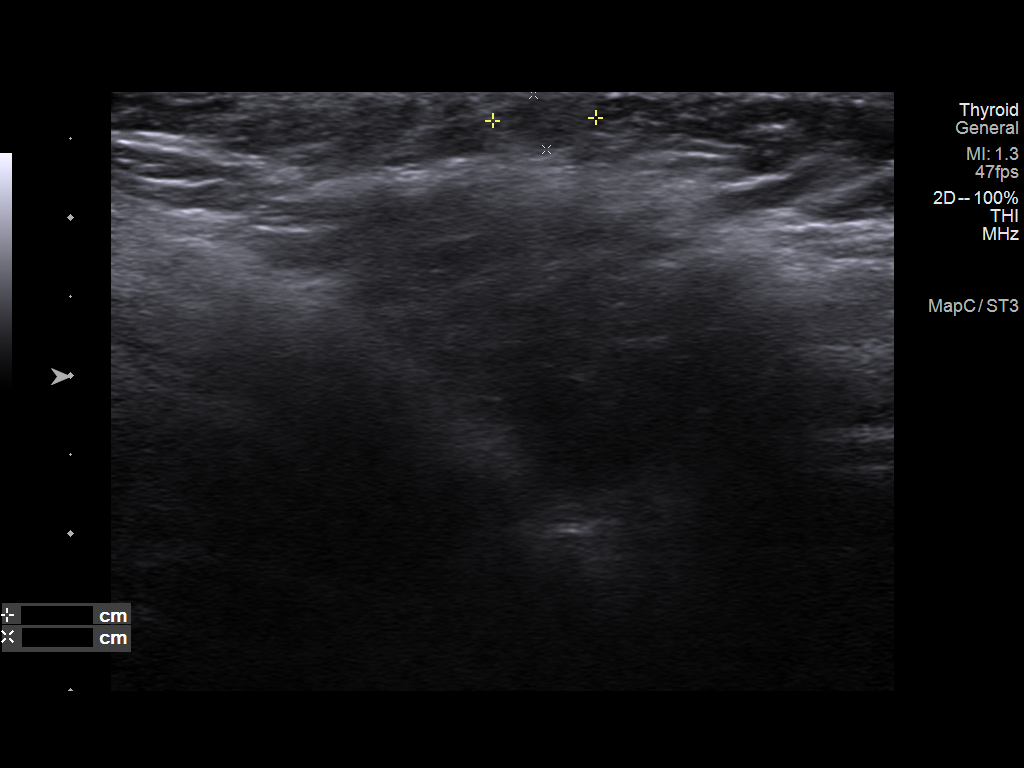
[im 5/7]
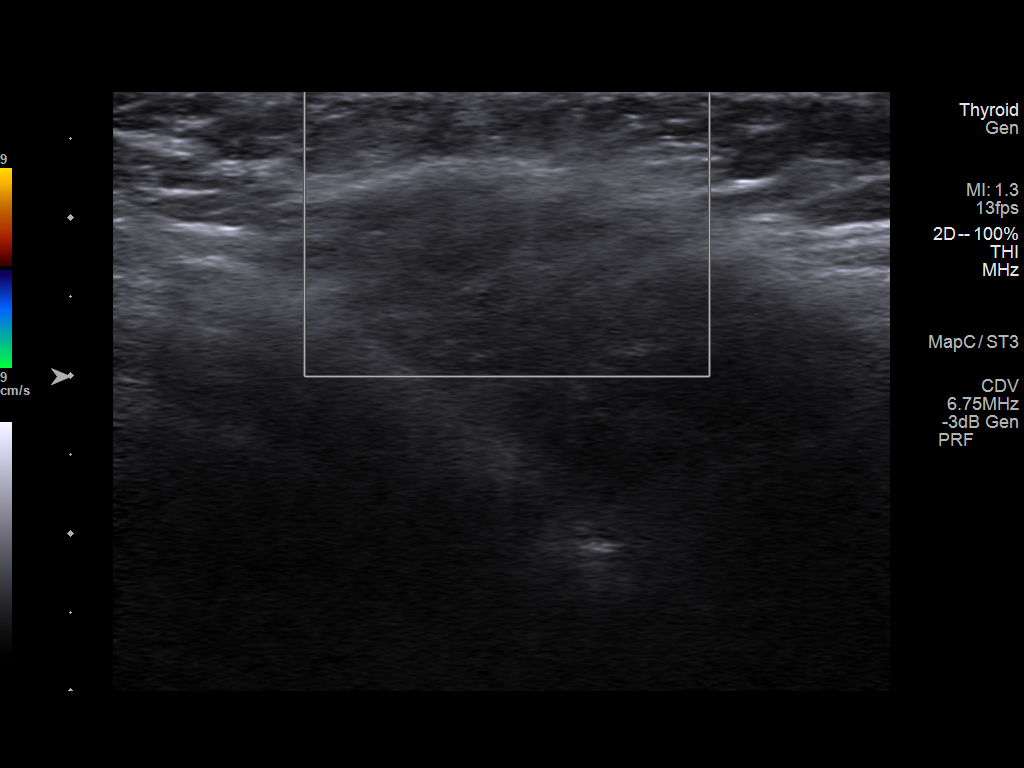
[im 6/7]
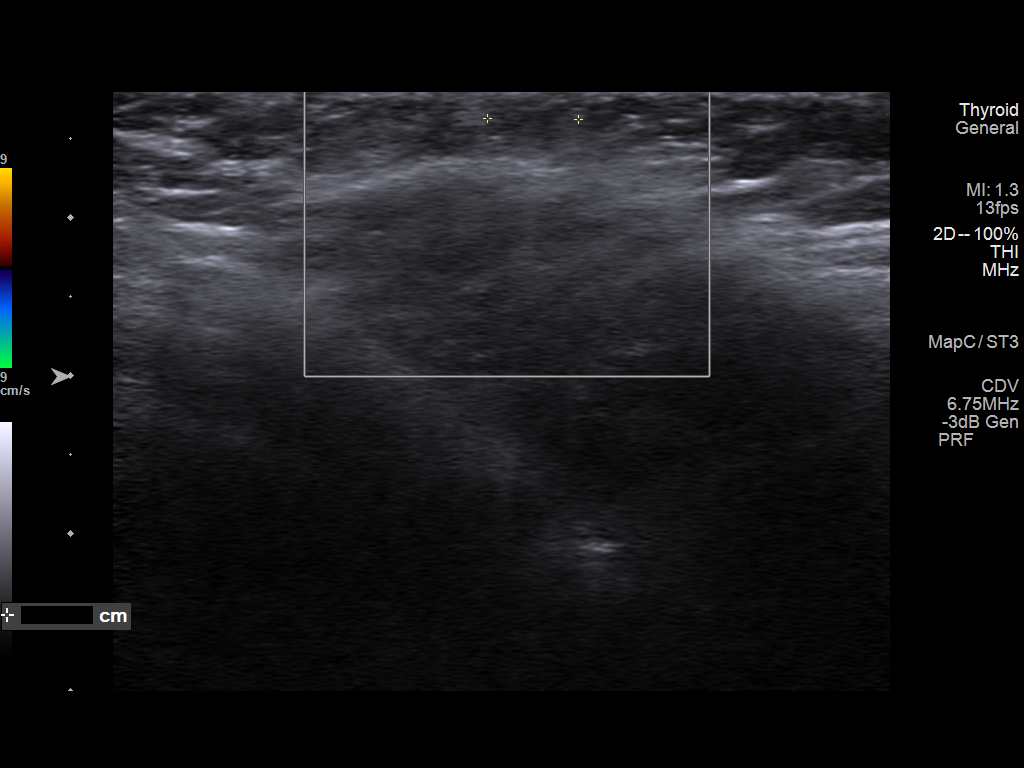
[im 7/7]
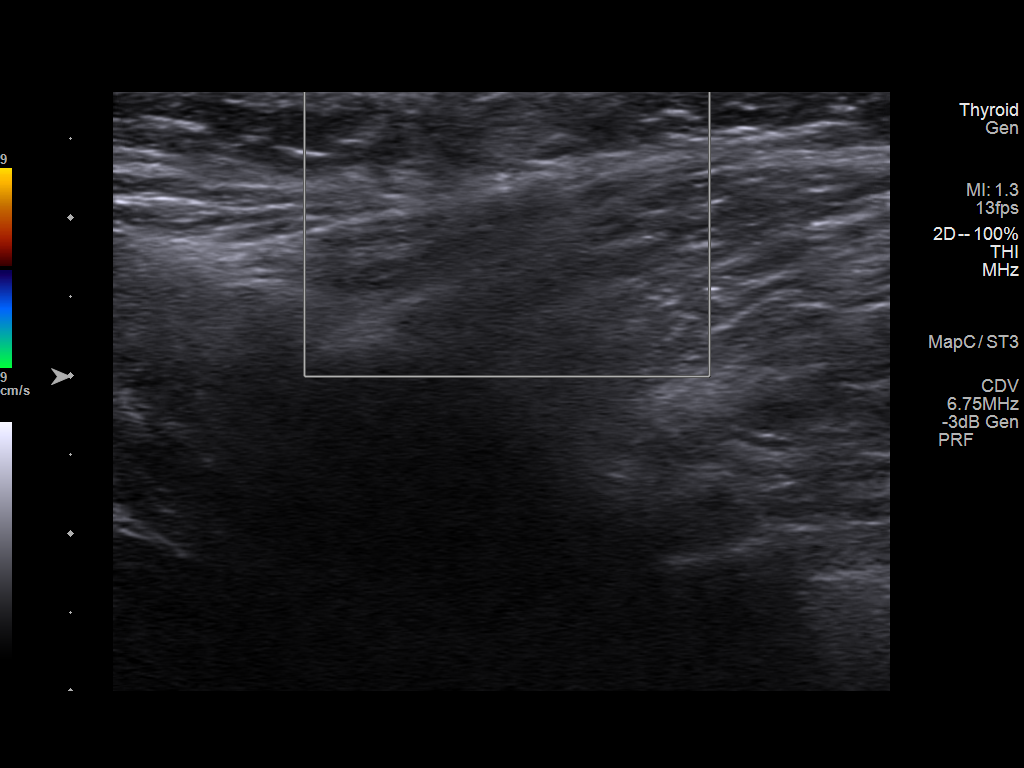

[7 of 7 positions shown; findings below may reference images not displayed]

FINDINGS: Sonographic evaluation of patient's palpable area of concern
involving the posterior aspect the left side of the neck correlates
with an ill-defined punctate (approximately 0.7 x 0.6 x 0.4 cm)
hypoechoic subcutaneous nodule. This nodule demonstrates similar
echogenicity as the adjacent subcutaneous fat. This nodule does not
demonstrate communication with the overlying dermal surface and does
not definitively contain an echogenic hilum to suggest that it
represents a lymph node.

Otherwise, there are no additional correlates for patient's palpable
area of concern. Specifically, no additional discrete solid or
cystic lesions.
IMPRESSION: Patient's palpable area of concern correlates with a ill-defined
punctate (approximate 0.7 cm) hypoechoic subcutaneous nodule which
while nonspecific demonstrates similar echogenicity as the adjacent
subcutaneous fat and thus may represent a lipoma.

## 2024-02-02 ENCOUNTER — Ambulatory Visit (INDEPENDENT_AMBULATORY_CARE_PROVIDER_SITE_OTHER): Payer: Self-pay | Admitting: Plastic Surgery

## 2024-02-02 VITALS — BP 106/71 | HR 90 | Ht 62.5 in | Wt 191.0 lb

## 2024-02-02 DIAGNOSIS — Z1231 Encounter for screening mammogram for malignant neoplasm of breast: Secondary | ICD-10-CM

## 2024-02-02 DIAGNOSIS — M542 Cervicalgia: Secondary | ICD-10-CM

## 2024-02-02 DIAGNOSIS — N62 Hypertrophy of breast: Secondary | ICD-10-CM

## 2024-02-02 DIAGNOSIS — M546 Pain in thoracic spine: Secondary | ICD-10-CM

## 2024-02-02 NOTE — Progress Notes (Signed)
 Referring Provider Rema Fendt, NP [address] [place],  [place] [address]   CC:  Chief Complaint  Patient presents with   Advice Only   Skin Problem      Meghan Reeves is an 43 y.o. female.  HPI: Meghan Reeves is a 43 year old female who presents today with complaints of upper back and neck pain which she attributes to the large size of her breast.  She is a Spanish speaker and the entire consult is conducted through an interpreter.  She states that this has been a problem since the birth of her last child several years ago.  She feels that her breasts are continuing to get larger and causing more difficulty.  She would like to have surgical reduction of the size of her breast.  Allergies  Allergen Reactions   Topamax [Topiramate] Other (See Comments)    Blurry vision, numbness of lips    Outpatient Encounter Medications as of 02/02/2024  Medication Sig   ibuprofen (ADVIL) 600 MG tablet Take 1 tablet (600 mg total) by mouth every 6 (six) hours as needed.   lidocaine (LIDODERM) 5 % Place 1 patch onto the skin daily. Remove & Discard patch within 12 hours or as directed by MD   metoCLOPramide (REGLAN) 10 MG tablet TAKE 1 TABLET (10 MG TOTAL) BY MOUTH 3 (THREE) TIMES DAILY AS NEEDED FOR NAUSEA (OR HEADACHE).   phentermine 15 MG capsule Take 1 capsule (15 mg total) by mouth every morning.   phentermine 30 MG capsule Take 1 capsule (30 mg total) by mouth every morning.   rizatriptan (MAXALT) 10 MG tablet Take 1 tablet (10 mg total) by mouth as needed for migraine. May repeat in 2 hours if needed   No facility-administered encounter medications on file as of 02/02/2024.     Past Medical History:  Diagnosis Date   Asthma, mild    Medical history non-contributory     Past Surgical History:  Procedure Laterality Date   APPENDECTOMY  09/2016   CESAREAN SECTION N/A 09/07/2017   Procedure: CESAREAN SECTION;  Surgeon: Catalina Antigua, MD;  [facility];  Service: Obstetrics;  Laterality: N/A;   LAPAROSCOPIC APPENDECTOMY N/A 09/29/2016   Procedure: APPENDECTOMY LAPAROSCOPIC;  Surgeon: Gaynelle Adu, MD;  Location: [facility];  Service: General;  Laterality: N/A;   NO PAST SURGERIES     VENTRAL HERNIA REPAIR N/A 04/29/2020   Procedure: HERNIA REPAIR VENTRAL ADULT, Open;  Surgeon: Campbell Lerner, MD;  Location: [facility];  Service: General;  Laterality: N/A;    Family History  Problem Relation Age of Onset   Asthma Mother    Migraines Sister     Social History   Social History Narrative   ** Merged History Encounter **       Lives at home with boyfriend  Right handed Caffeine: 30 oz daily of coca cola     Review of Systems General: Denies fevers, chills, weight loss CV: Denies chest pain, shortness of breath, palpitations Breast: Patient feels the large size of her breast is contributing to her upper back and neck pain.  She denies any other breast issues.  Physical Exam    02/02/2024    1:58 PM 12/31/2023    1:18 PM 12/14/2023    2:42 PM  Vitals with BMI  Height 5' 2.5" 5\' 1"    Weight 191 lbs 196 lbs 198 lbs 11 oz  BMI 34.36 37.05   Systolic 106 126   Diastolic  71 80   Pulse 90 100     General:  No acute distress,  Alert and oriented, Non-Toxic, Normal speech and affect Breast: Patient has large pendulous breast with grade 3 ptosis.  There are no dominant masses on physical exam and the nipples are normal in appearance without evidence of nipple discharge.  The sternal notch to nipple distance on the right is 39 cm and 38 cm on the left the fold to nipple distance on the right is 19 cm and 19 cm on the left Mammogram: She has not had a mammogram.  She is requesting that an order be placed. Assessment/Plan Symptomatic macromastia patient has very large breasts and would likely benefit from a bilateral breast reduction.  I believe that I can remove 800 g per breast.  Using the interpreter I discussed breast  reductions with the patient at great length.  We discussed the location of the incisions and the unpredictable nature of scarring and wound healing.  We discussed the risks of bleeding, infection, and seroma formation.  We discussed the risk of nipple loss due to nipple ischemia.  We discussed the possibility that mammograms may be more difficult to interpret in the future.  We discussed the use of drains postoperatively.  We discussed the importance of a compressive supportive garment for 6 weeks.  We discussed the postoperative limitations including no heavy lifting greater than 20 pounds, no vigorous activity, and no submerging the incisions in water for 6 weeks.  She may return to light activity as tolerated and she is strongly encouraged to begin ambulating on the day of surgery to help decrease the risk of DVT.  She stated through the interpreter that she had no further questions.  Photographs were obtained today with her consent.  Will submit her for a bilateral breast reduction at her request and we will provide her for a quote for reduction if she is not able to obtain insurance to pay for the reduction.  Santiago Glad 02/02/2024, 2:31 PM

## 2024-02-03 ENCOUNTER — Telehealth: Payer: Self-pay | Admitting: Surgical

## 2024-02-03 ENCOUNTER — Encounter: Payer: Self-pay | Admitting: Family

## 2024-02-03 NOTE — Telephone Encounter (Signed)
  [facility] [address] [place], [place] [address] Phone: [phone] Fax: [phone]  Cosmetic Procedure Quote    Patient: Meghan Reeves DOB:  1981/08/26  Procedure: Bilateral Breast Reduction Location: [facility] - 3.5 hours Surgeon: Dr. Weyman Croon    Provider fee:  $5,100    OR fee:  $2,500 Anesthesia:  $1,900  Total: $9,500     Silicone scar cream $50-$99 (Recommended after surgery for scarring and available for purchase in our office with provider approval. We carry Tye [place] and Silagen products)   The provider fee is due in full to our office at your pre-operative appointment. We currently do not offer financing, but payments may be made in advance once your surgery is scheduled.  The OR fee is paid to the [facility] ([facility] and the anesthesia fee is paid to [facility]. Both [facility] and [facility] take CareCredit as an option for payment. They will contact you for payment prior to surgery.   Quotes are valid for 3 months

## 2024-02-08 ENCOUNTER — Ambulatory Visit: Payer: Self-pay

## 2024-02-09 ENCOUNTER — Ambulatory Visit
Admission: RE | Admit: 2024-02-09 | Discharge: 2024-02-09 | Disposition: A | Discharge status: Home / Self Care | Payer: Self-pay | Source: Ambulatory Visit | Attending: Plastic Surgery

## 2024-02-09 DIAGNOSIS — Z1231 Encounter for screening mammogram for malignant neoplasm of breast: Secondary | ICD-10-CM

## 2024-02-09 DIAGNOSIS — N62 Hypertrophy of breast: Secondary | ICD-10-CM

## 2024-02-09 DIAGNOSIS — M546 Pain in thoracic spine: Secondary | ICD-10-CM

## 2024-02-11 ENCOUNTER — Other Ambulatory Visit: Payer: Self-pay | Admitting: Family

## 2024-02-11 DIAGNOSIS — Z6837 Body mass index (BMI) 37.0-37.9, adult: Secondary | ICD-10-CM

## 2024-02-11 DIAGNOSIS — Z7689 Persons encountering health services in other specified circumstances: Secondary | ICD-10-CM

## 2024-02-11 DIAGNOSIS — R635 Abnormal weight gain: Secondary | ICD-10-CM

## 2024-02-11 NOTE — Telephone Encounter (Signed)
 Copied from CRM [id number]. Topic: Clinical - Medication Refill >> Feb 11, 2024  3:49 PM Elle L wrote: Most Recent Primary Care Visit:  Provider: Ricky Stabs J  Department: [facility]  Visit Type: TRANSFER OF CARE  Date: 12/31/2023  Medication: phentermine 15 MG capsule  Has the patient contacted their pharmacy? Yes  Is this the correct pharmacy for this prescription? Yes  This is the patient's preferred pharmacy:   [id number] [place], [place] - [address] AT [facility] [address] [place] [address] Phone: [phone] Fax: [phone]   Has the prescription been filled recently? No  Is the patient out of the medication? Yes  Has the patient been seen for an appointment in the last year OR does the patient have an upcoming appointment? Yes  Can we respond through MyChart? No  Agent: Please be advised that Rx refills may take up to 3 business days. We ask that you follow-up with your pharmacy.

## 2024-02-14 NOTE — Telephone Encounter (Signed)
 Requested medications are due for refill today.  yes  Requested medications are on the active medications list.  yes  Last refill. 01/10/2024 #30 0 rf  Future visit scheduled.   no  Notes to clinic.  Refill not delegated.    Requested Prescriptions  Pending Prescriptions Disp Refills   phentermine 15 MG capsule 30 capsule 0    Sig: Take 1 capsule (15 mg total) by mouth every morning.     Not Delegated - Neurology: Anticonvulsants - Controlled - phentermine hydrochloride Failed - 02/14/2024 10:14 AM      Failed - This refill cannot be delegated      Passed - eGFR in normal range and within 360 days    GFR calc Af Amer  Date Value Ref Range Status  04/29/2020 >60 >60 mL/min Final   GFR, Estimated  Date Value Ref Range Status  09/26/2023 >60 >60 mL/min Final    Comment:    (NOTE) Calculated using the CKD-EPI Creatinine Equation (2021)    eGFR  Date Value Ref Range Status  02/25/2022 101 >59 mL/min/1.73 Final         Passed - Cr in normal range and within 360 days    Creatinine, Ser  Date Value Ref Range Status  09/26/2023 0.72 0.44 - 1.00 mg/dL Final         Passed - Last BP in normal range    BP Readings from Last 1 Encounters:  02/02/24 106/71         Passed - Valid encounter within last 6 months    Recent Outpatient Visits           1 month ago Encounter to establish care   [facility], [place], NP   4 months ago Candida vaginitis   [place] [facility], [place], NP   1 year ago Encounter for wellness examination   [place] [facility], Kasandra Knudsen, PA-C   1 year ago Subcutaneous nodule of neck   [place] [facility], Kas[place] Knudsen, [place]   3 years ago Vaginal discharge   [facility], [place] J, NP              Passed - Weight completed in the last 3 months    Wt Readings from Last 1 Encounters:   02/02/24 191 lb (86.6 kg)

## 2024-02-14 NOTE — Telephone Encounter (Signed)
Please schedule appointment for further refills

## 2024-02-15 ENCOUNTER — Encounter: Payer: Self-pay | Admitting: Family

## 2024-02-15 ENCOUNTER — Ambulatory Visit (INDEPENDENT_AMBULATORY_CARE_PROVIDER_SITE_OTHER): Payer: Self-pay | Admitting: Family

## 2024-02-15 VITALS — BP 114/77 | HR 77 | Temp 98.1°F | Resp 16 | Ht 62.0 in | Wt 188.6 lb

## 2024-02-15 DIAGNOSIS — R635 Abnormal weight gain: Secondary | ICD-10-CM

## 2024-02-15 DIAGNOSIS — Z6834 Body mass index (BMI) 34.0-34.9, adult: Secondary | ICD-10-CM

## 2024-02-15 DIAGNOSIS — Z603 Acculturation difficulty: Secondary | ICD-10-CM

## 2024-02-15 DIAGNOSIS — Z7689 Persons encountering health services in other specified circumstances: Secondary | ICD-10-CM

## 2024-02-15 MED ORDER — PHENTERMINE HCL 37.5 MG PO CAPS: 37.5000 mg | ORAL_CAPSULE | ORAL | 0 refills | Status: DC

## 2024-02-15 NOTE — Telephone Encounter (Signed)
 Schedule appointment?

## 2024-02-15 NOTE — Progress Notes (Unsigned)
 Patient ID: Meghan Reeves, female    DOB: 1980-12-25  MRN: [id number]  CC: Weight Check  Subjective: Meghan Reeves is a 43 y.o. female who presents for weight check.   Her concerns today include: ***  Patient Active Problem List   Diagnosis Date Noted   Musculoskeletal disorder involving upper trapezius muscle 03/24/2022   Lipoma of neck 03/24/2022   Hypertriglyceridemia 02/25/2022   Class 2 obesity due to excess calories with body mass index (BMI) of 35.0 to 35.9 in adult 02/25/2022   Bacterial vaginosis 01/09/2021   Candida vaginitis 01/09/2021   Status post laparoscopic cholecystectomy 05/14/2020   Diastasis recti 04/25/2020   Nexplanon in place 12/14/2019   Migraine without aura and with status migrainosus, not intractable 12/08/2018   Medication overuse headache 12/08/2018   Tobacco use disorder, continuous 02/03/2017   Encounter for smoking cessation counseling 02/03/2017   Spanish speaking patient 10/01/2016     Current Outpatient Medications on File Prior to Visit  Medication Sig Dispense Refill   ibuprofen (ADVIL) 600 MG tablet Take 1 tablet (600 mg total) by mouth every 6 (six) hours as needed. 30 tablet 0   lidocaine (LIDODERM) 5 % Place 1 patch onto the skin daily. Remove & Discard patch within 12 hours or as directed by MD 30 patch 0   metoCLOPramide (REGLAN) 10 MG tablet TAKE 1 TABLET (10 MG TOTAL) BY MOUTH 3 (THREE) TIMES DAILY AS NEEDED FOR NAUSEA (OR HEADACHE). 15 tablet 11   phentermine 15 MG capsule Take 1 capsule (15 mg total) by mouth every morning. 30 capsule 0   rizatriptan (MAXALT) 10 MG tablet Take 1 tablet (10 mg total) by mouth as needed for migraine. May repeat in 2 hours if needed 10 tablet 11   phentermine 30 MG capsule Take 1 capsule (30 mg total) by mouth every morning. (Patient not taking: Reported on 02/15/2024) 30 capsule 0   No current facility-administered medications on file prior to visit.    Allergies  Allergen  Reactions   Topamax [Topiramate] Other (See Comments)    Blurry vision, numbness of lips    Social History   Socioeconomic History   Marital status: Significant Other    Spouse name: Not on file   Number of children: 1   Years of education: Not on file   Highest education level: 6th grade  Occupational History   Not on file  Tobacco Use   Smoking status: Former    Current packs/day: 0.00    Average packs/day: 0.3 packs/day for 10.0 years (2.5 ttl pk-yrs)    Types: Cigarettes    Start date: 04/06/2008    Quit date: 04/06/2018    Years since quitting: 5.8   Smokeless tobacco: Never  Vaping Use   Vaping status: Never Used  Substance and Sexual Activity   Alcohol use: No    Alcohol/week: 0.0 standard drinks of alcohol   Drug use: No   Sexual activity: Yes    Birth control/protection: None  Other Topics Concern   Not on file  Social History Narrative   ** Merged History Encounter **       Lives at home with boyfriend  Right handed Caffeine: 30 oz daily of coca cola   Social Drivers of Corporate investment banker Strain: Low Risk  (02/15/2024)   Overall Financial Resource Strain (CARDIA)    Difficulty of Paying Living Expenses: Not hard at all  Food Insecurity: No Food Insecurity (12/14/2023)   Hunger Vital  Sign    Worried About Programme researcher, broadcasting/film/video in the Last Year: Never true    Ran Out of Food in the Last Year: Never true  Transportation Needs: No Transportation Needs (02/15/2024)   PRAPARE - Administrator, Civil Service (Medical): No    Lack of Transportation (Non-Medical): No  Physical Activity: Insufficiently Active (02/15/2024)   Exercise Vital Sign    Days of Exercise per Week: 4 days    Minutes of Exercise per Session: 30 min  Stress: No Stress Concern Present (02/15/2024)   [employer] of Occupational Health - Occupational Stress Questionnaire    Feeling of Stress : Not at all  Social Connections: Moderately Isolated (02/15/2024)   Social  Connection and Isolation Panel [NHANES]    Frequency of Communication with Friends and Family: More than three times a week    Frequency of Social Gatherings with Friends and Family: More than three times a week    Attends Religious Services: Never    Database administrator or Organizations: No    Attends Banker Meetings: Never    Marital Status: Married  Catering manager Violence: Not At Risk (02/15/2024)   Humiliation, Afraid, Rape, and Kick questionnaire    Fear of Current or Ex-Partner: No    Emotionally Abused: No    Physically Abused: No    Sexually Abused: No    Family History  Problem Relation Age of Onset   Asthma Mother    Migraines Sister     Past Surgical History:  Procedure Laterality Date   APPENDECTOMY  09/2016   CESAREAN SECTION N/A 09/07/2017   Procedure: CESAREAN SECTION;  Surgeon: Catalina Antigua, MD;  [facility];  Service: Obstetrics;  Laterality: N/A;   LAPAROSCOPIC APPENDECTOMY N/A 09/29/2016   Procedure: APPENDECTOMY LAPAROSCOPIC;  Surgeon: Gaynelle Adu, MD;  Location: [facility];  Service: General;  Laterality: N/A;   NO PAST SURGERIES     VENTRAL HERNIA REPAIR N/A 04/29/2020   Procedure: HERNIA REPAIR VENTRAL ADULT, Open;  Surgeon: Campbell Lerner, MD;  Location: [facility];  Service: General;  Laterality: N/A;    ROS: Review of Systems Negative except as stated above  PHYSICAL EXAM: BP 114/77   Pulse 77   Temp 98.1 F (36.7 C) (Oral)   Resp 16   Ht 5\' 2"  (1.575 m)   Wt 188 lb 9.6 oz (85.5 kg)   LMP 02/08/2024 (Exact Date)   SpO2 97%   BMI 34.50 kg/m   Wt Readings from Last 3 Encounters:  02/15/24 188 lb 9.6 oz (85.5 kg)  02/02/24 191 lb (86.6 kg)  12/31/23 196 lb (88.9 kg)    Physical Exam  {female adult master:310786} {female adult master:310785}     Latest Ref Rng & Units 09/26/2023   10:02 PM 02/17/2023    2:37 AM 02/25/2022   10:51 AM  CMP  Glucose 70 - 99 mg/dL 027  253  96   BUN 6 - 20 mg/dL 12  8   13    Creatinine 0.44 - 1.00 mg/dL 6.64  4.03  4.74   Sodium 135 - 145 mmol/L 134  136  141   Potassium 3.5 - 5.1 mmol/L 3.9  4.0  4.3   Chloride 98 - 111 mmol/L 105  103  105   CO2 22 - 32 mmol/L 22  22    Calcium 8.9 - 10.3 mg/dL 8.7  8.8  9.0   Total Protein 6.5 - 8.1 g/dL 6.7  6.8   Total Bilirubin <1.2 mg/dL 0.6   0.5   Alkaline Phos 38 - 126 U/L 68   78   AST 15 - 41 U/L 22   24   ALT 0 - 44 U/L 19      Lipid Panel     Component Value Date/Time   CHOL 167 10/12/2023 1427   TRIG 110 10/12/2023 1427   HDL 38 (L) 10/12/2023 1427   CHOLHDL 4.4 10/12/2023 1427   LDLCALC 109 (H) 10/12/2023 1427    CBC    Component Value Date/Time   WBC 10.1 09/26/2023 2202   RBC 4.22 09/26/2023 2202   HGB 13.1 09/26/2023 2202   HGB 14.3 02/25/2022 1051   HCT 37.8 09/26/2023 2202   HCT 41.1 02/25/2022 1051   PLT 293 09/26/2023 2202   PLT 266 02/25/2022 1051   MCV 89.6 09/26/2023 2202   MCV 89 02/25/2022 1051   MCH 31.0 09/26/2023 2202   MCHC 34.7 09/26/2023 2202   RDW 11.9 09/26/2023 2202   RDW 12.7 02/25/2022 1051   LYMPHSABS 2.5 02/25/2022 1051   MONOABS 0.5 04/29/2020 1108   EOSABS 0.3 02/25/2022 1051   BASOSABS 0.0 02/25/2022 1051    ASSESSMENT AND PLAN:  There are no diagnoses linked to this encounter.   Patient was given the opportunity to ask questions.  Patient verbalized understanding of the plan and was able to repeat key elements of the plan. Patient was given clear instructions to go to Emergency Department or return to medical center if symptoms don't improve, worsen, or new problems develop.The patient verbalized understanding.   No orders of the defined types were placed in this encounter.    Requested Prescriptions    No prescriptions requested or ordered in this encounter    No follow-ups on file.  Rema Fendt, NP

## 2024-02-16 ENCOUNTER — Ambulatory Visit: Payer: Self-pay | Admitting: Dietician

## 2024-02-17 ENCOUNTER — Telehealth: Payer: Self-pay | Admitting: Emergency Medicine

## 2024-02-17 ENCOUNTER — Telehealth: Payer: Self-pay | Admitting: Family

## 2024-02-17 NOTE — Telephone Encounter (Signed)
 Reason for CRM: Patient called about medication that was prescribed yesterday: phentermine 37.5 MG capsule Patient states that she would like to know why the dose was increased. She states she was having problems with the last dose of 30 causing dry mouth and other problems and would only like to take the 15 if possible. Thank You

## 2024-02-17 NOTE — Telephone Encounter (Signed)
 Copied from CRM [id number]. Topic: Clinical - Prescription Issue >> Feb 17, 2024  2:51 PM Danika B wrote: Reason for CRM: Patient states via Interpreter that she needs her Rx for phentermine 37.5 MG capsule changed to 15 mg. Patient states that when taking the 30mg  or 37.5mg  dosage she feels fast heart rate. Declined triage and states that this is repeated history that only happens when she takes the higher dosage. Only would like Rx changed to 15mg .   Callback [phone]  Preferred Pharmacy: [facility] [address] [place] [address] Phone: [phone] Fax: [phone]

## 2024-02-18 ENCOUNTER — Other Ambulatory Visit: Payer: Self-pay | Admitting: Family

## 2024-02-18 DIAGNOSIS — Z7689 Persons encountering health services in other specified circumstances: Secondary | ICD-10-CM

## 2024-02-18 MED ORDER — PHENTERMINE HCL 15 MG PO CAPS: 15.0000 mg | ORAL_CAPSULE | ORAL | 0 refills | Status: DC

## 2024-02-18 NOTE — Telephone Encounter (Signed)
 Complete

## 2024-02-18 NOTE — Telephone Encounter (Signed)
 note

## 2024-03-09 ENCOUNTER — Encounter: Payer: Self-pay | Admitting: Family

## 2024-03-17 ENCOUNTER — Ambulatory Visit (INDEPENDENT_AMBULATORY_CARE_PROVIDER_SITE_OTHER): Payer: Self-pay | Admitting: Family

## 2024-03-17 ENCOUNTER — Telehealth: Payer: Self-pay | Admitting: Family

## 2024-03-17 ENCOUNTER — Encounter: Payer: Self-pay | Admitting: Family

## 2024-03-17 ENCOUNTER — Other Ambulatory Visit: Payer: Self-pay | Admitting: Family

## 2024-03-17 VITALS — BP 111/69 | HR 102 | Temp 98.1°F | Resp 16 | Ht 62.0 in | Wt 182.0 lb

## 2024-03-17 DIAGNOSIS — R635 Abnormal weight gain: Secondary | ICD-10-CM

## 2024-03-17 DIAGNOSIS — Z603 Acculturation difficulty: Secondary | ICD-10-CM

## 2024-03-17 DIAGNOSIS — K649 Unspecified hemorrhoids: Secondary | ICD-10-CM

## 2024-03-17 DIAGNOSIS — Z7689 Persons encountering health services in other specified circumstances: Secondary | ICD-10-CM

## 2024-03-17 DIAGNOSIS — Z6833 Body mass index (BMI) 33.0-33.9, adult: Secondary | ICD-10-CM

## 2024-03-17 DIAGNOSIS — J358 Other chronic diseases of tonsils and adenoids: Secondary | ICD-10-CM

## 2024-03-17 MED ORDER — HYDROCORTISONE ACETATE 1 % EX OINT: 1.0000 | TOPICAL_OINTMENT | Freq: Every day | CUTANEOUS | 2 refills | Status: DC | PRN

## 2024-03-17 MED ORDER — PHENTERMINE HCL 15 MG PO CAPS: 15.0000 mg | ORAL_CAPSULE | ORAL | 0 refills | Status: DC

## 2024-03-17 MED ORDER — PHENTERMINE HCL 37.5 MG PO CAPS: 37.5000 mg | ORAL_CAPSULE | ORAL | 0 refills | Status: DC

## 2024-03-17 NOTE — Progress Notes (Signed)
 Patient ID: Meghan Reeves, female    DOB: [birth year]  MRN: [id number]  CC: Weight Check   Subjective: Meghan Reeves is a 43 y.o. female who presents for weight check.   Her concerns today include:  - Doing well on Phentermine , no issues/concerns. She watches what she eats. - Tonsil stones and would like to have removed.  - Hemorrhoids. Denies red flag symptoms. Would like to try medication to help.  Patient Active Problem List   Diagnosis Date Noted   Musculoskeletal disorder involving upper trapezius muscle 03/24/2022   Lipoma of neck 03/24/2022   Hypertriglyceridemia 02/25/2022   Class 2 obesity due to excess calories with body mass index (BMI) of 35.0 to 35.9 in adult 02/25/2022   Bacterial vaginosis 01/09/2021   Candida vaginitis 01/09/2021   Status post laparoscopic cholecystectomy 05/14/2020   Diastasis recti 04/25/2020   Nexplanon in place 12/14/2019   Migraine without aura and with status migrainosus, not intractable 12/08/2018   Medication overuse headache 12/08/2018   Tobacco use disorder, continuous 02/03/2017   Encounter for smoking cessation counseling 02/03/2017   Spanish speaking patient 10/01/2016     Current Outpatient Medications on File Prior to Visit  Medication Sig Dispense Refill   ibuprofen  (ADVIL ) 600 MG tablet Take 1 tablet (600 mg total) by mouth every 6 (six) hours as needed. 30 tablet 0   lidocaine  (LIDODERM ) 5 % Place 1 patch onto the skin daily. Remove & Discard patch within 12 hours or as directed by MD 30 patch 0   metoCLOPramide  (REGLAN ) 10 MG tablet TAKE 1 TABLET (10 MG TOTAL) BY MOUTH 3 (THREE) TIMES DAILY AS NEEDED FOR NAUSEA (OR HEADACHE). 15 tablet 11   phentermine  15 MG capsule Take 1 capsule (15 mg total) by mouth every morning. 30 capsule 0   rizatriptan  (MAXALT ) 10 MG tablet Take 1 tablet (10 mg total) by mouth as needed for migraine. May repeat in 2 hours if needed 10 tablet 11   phentermine  30 MG capsule Take 1  capsule (30 mg total) by mouth every morning. (Patient not taking: Reported on 02/15/2024) 30 capsule 0   No current facility-administered medications on file prior to visit.    Allergies  Allergen Reactions   Topamax  [Topiramate ] Other (See Comments)    Blurry vision, numbness of lips    Social History   Socioeconomic History   Marital status: Significant Other    Spouse name: Not on file   Number of children: 1   Years of education: Not on file   Highest education level: 6th grade  Occupational History   Not on file  Tobacco Use   Smoking status: Former    Current packs/day: 0.00    Average packs/day: 0.3 packs/day for 10.0 years (2.5 ttl pk-yrs)    Types: Cigarettes    Start date: 04/06/2008    Quit date: 04/06/2018    Years since quitting: 5.9   Smokeless tobacco: Never  Vaping Use   Vaping status: Never Used  Substance and Sexual Activity   Alcohol use: No    Alcohol/week: 0.0 standard drinks of alcohol   Drug use: No   Sexual activity: Yes    Birth control/protection: None  Other Topics Concern   Not on file  Social History Narrative   ** Merged History Encounter **       Lives at home with boyfriend  Right handed Caffeine : 30 oz daily of coca cola   Social Drivers of Dispensing optician  Resource Strain: Low Risk  (02/15/2024)   Overall Financial Resource Strain (CARDIA)    Difficulty of Paying Living Expenses: Not hard at all  Food Insecurity: No Food Insecurity (12/14/2023)   Hunger Vital Sign    Worried About Running Out of Food in the Last Year: Never true    Ran Out of Food in the Last Year: Never true  Transportation Needs: No Transportation Needs (02/15/2024)   PRAPARE - Administrator, Civil Service (Medical): No    Lack of Transportation (Non-Medical): No  Physical Activity: Insufficiently Active (02/15/2024)   Exercise Vital Sign    Days of Exercise per Week: 4 days    Minutes of Exercise per Session: 30 min  Stress: No Stress Concern  Present (02/15/2024)   [employer] of Occupational Health - Occupational Stress Questionnaire    Feeling of Stress : Not at all  Social Connections: Moderately Isolated (02/15/2024)   Social Connection and Isolation Panel [NHANES]    Frequency of Communication with Friends and Family: More than three times a week    Frequency of Social Gatherings with Friends and Family: More than three times a week    Attends Religious Services: Never    Database administrator or Organizations: No    Attends Banker Meetings: Never    Marital Status: Married  Catering manager Violence: Not At Risk (02/15/2024)   Humiliation, Afraid, Rape, and Kick questionnaire    Fear of Current or Ex-Partner: No    Emotionally Abused: No    Physically Abused: No    Sexually Abused: No    Family History  Problem Relation Age of Onset   Asthma Mother    Migraines Sister     Past Surgical History:  Procedure Laterality Date   APPENDECTOMY  09/2016   CESAREAN SECTION N/A 09/07/2017   Procedure: CESAREAN SECTION;  Surgeon: Verlyn Goad, MD;  Location: [facility];  Service: Obstetrics;  Laterality: N/A;   LAPAROSCOPIC APPENDECTOMY N/A 09/29/2016   Procedure: APPENDECTOMY LAPAROSCOPIC;  Surgeon: Aldean Hummingbird, MD;  Location: [facility];  Service: General;  Laterality: N/A;   NO PAST SURGERIES     VENTRAL HERNIA REPAIR N/A 04/29/2020   Procedure: HERNIA REPAIR VENTRAL ADULT, Open;  Surgeon: Flynn Hylan, MD;  Location: [facility];  Service: General;  Laterality: N/A;    ROS: Review of Systems Negative except as stated above  PHYSICAL EXAM: BP 111/69   Pulse (!) 102   Temp 98.1 F (36.7 C) (Oral)   Resp 16   Ht 5\' 2"  (1.575 m)   Wt 182 lb (82.6 kg)   LMP 03/03/2024 (Approximate)   SpO2 97%   BMI 33.29 kg/m   Wt Readings from Last 3 Encounters:  03/17/24 182 lb (82.6 kg)  02/15/24 188 lb 9.6 oz (85.5 kg)  02/02/24 191 lb (86.6 kg)    Physical Exam HENT:     Head: Normocephalic  and atraumatic.     Nose: Nose normal.     Mouth/Throat:     Mouth: Mucous membranes are moist.     Pharynx: Oropharynx is clear.  Eyes:     Extraocular Movements: Extraocular movements intact.     Conjunctiva/sclera: Conjunctivae normal.     Pupils: Pupils are equal, round, and reactive to light.  Cardiovascular:     Rate and Rhythm: Tachycardia present.     Pulses: Normal pulses.     Heart sounds: Normal heart sounds.  Pulmonary:     Effort:  Pulmonary effort is normal.     Breath sounds: Normal breath sounds.  Musculoskeletal:        General: Normal range of motion.     Cervical back: Normal range of motion and neck supple.  Neurological:     General: No focal deficit present.     Mental Status: She is alert and oriented to person, place, and time.  Psychiatric:        Mood and Affect: Mood normal.        Behavior: Behavior normal.     ASSESSMENT AND PLAN: 1. Encounter for weight management (Primary) 2. BMI 33.0-33.9,adult 3. Weight gain - Patient lost 6 pounds since previous office visit.  - Continue Phentermine  as prescribed. Counseled on medication adherence/adverse effects.  - Follow-up with primary provider in 4 weeks or sooner if needed.  - phentermine  37.5 MG capsule; Take 1 capsule (37.5 mg total) by mouth every morning.  Dispense: 30 capsule; Refill: 0  4. Tonsillolith - Referral to ENT for evaluation/management. - Ambulatory referral to ENT  5. Hemorrhoids, unspecified hemorrhoid type - Hydrocortisone Acetate as prescribed. Counseled on medication adherence/adverse effects.  - Follow-up with primary provider as scheduled. - Hydrocortisone Acetate 1 % OINT; Apply 1 Application topically daily as needed.  Dispense: 28.4 g; Refill: 2  6. Language barrier - [employer]. ID#: [id number].    Patient was given the opportunity to ask questions.  Patient verbalized understanding of the plan and was able to repeat key elements of the plan. Patient was given  clear instructions to go to Emergency Department or return to medical center if symptoms don't improve, worsen, or new problems develop.The patient verbalized understanding.   Orders Placed This Encounter  Procedures   Ambulatory referral to ENT     Requested Prescriptions   Signed Prescriptions Disp Refills   phentermine  37.5 MG capsule 30 capsule 0    Sig: Take 1 capsule (37.5 mg total) by mouth every morning.   Hydrocortisone Acetate 1 % OINT 28.4 g 2    Sig: Apply 1 Application topically daily as needed.    Return in about 4 weeks (around 04/14/2024) for Follow-Up or next available weight check.  Senaida Dama, NP

## 2024-03-17 NOTE — Telephone Encounter (Signed)
 Complete

## 2024-03-17 NOTE — Telephone Encounter (Signed)
 Patient would like a decrease in her Medication Phentermine  of 15 mg sent to pharmacy patient stated 37 mg is to much

## 2024-03-17 NOTE — Progress Notes (Signed)
 Weight management, white lumps inside of mouth

## 2024-04-07 ENCOUNTER — Encounter (INDEPENDENT_AMBULATORY_CARE_PROVIDER_SITE_OTHER): Payer: Self-pay | Admitting: Otolaryngology

## 2024-04-07 ENCOUNTER — Telehealth: Payer: Self-pay

## 2024-04-07 NOTE — Telephone Encounter (Signed)
 error

## 2024-04-11 ENCOUNTER — Encounter: Payer: Self-pay | Admitting: Family

## 2024-04-12 ENCOUNTER — Encounter: Payer: Self-pay | Admitting: Physician Assistant

## 2024-04-12 ENCOUNTER — Ambulatory Visit (INDEPENDENT_AMBULATORY_CARE_PROVIDER_SITE_OTHER): Payer: Self-pay | Admitting: Surgical

## 2024-04-12 ENCOUNTER — Encounter: Payer: Self-pay | Admitting: Surgical

## 2024-04-12 VITALS — BP 118/80 | HR 51 | Ht 62.0 in | Wt 182.0 lb

## 2024-04-12 DIAGNOSIS — N62 Hypertrophy of breast: Secondary | ICD-10-CM

## 2024-04-12 DIAGNOSIS — M546 Pain in thoracic spine: Secondary | ICD-10-CM

## 2024-04-12 MED ORDER — OXYCODONE HCL 5 MG PO TABS: 5.0000 mg | ORAL_TABLET | Freq: Four times a day (QID) | ORAL | 0 refills | Status: AC | PRN

## 2024-04-12 MED ORDER — ONDANSETRON HCL 4 MG PO TABS: 4.0000 mg | ORAL_TABLET | Freq: Three times a day (TID) | ORAL | 0 refills | Status: DC | PRN

## 2024-04-12 NOTE — Progress Notes (Signed)
 Patient ID: Meghan Reeves, female    DOB: 25-Feb-1981, 43 y.o.   MRN: [id number]  Chief Complaint  Patient presents with   Pre-op Exam      ICD-10-CM   1. Macromastia  N62     2. Bilateral thoracic back pain, unspecified chronicity  M54.6       History of Present Illness: Meghan Reeves is a 43 y.o.  female  with a history of macromastia.  She presents for preoperative evaluation for upcoming procedure, Bilateral Breast Reduction, scheduled for 05/08/2024 with Dr.  Carolynne Citron.  Spanish interpreter was present through the entirety of the visit.  Spanish interpreter was able to review the consent form with patient prior to appointment.  The patient has not had problems with anesthesia.  She reports a history of appendectomy about 4 years ago, tolerated this well without any issues or complications. No history of DVT/PE.  No family history of DVT/PE.  No family or personal history of bleeding or clotting disorders.  Patient is not currently taking any blood thinners.  No history of CVA/MI.  She denies any cardiac disease.  She denies any ulcerative colitis or Crohn's disease.  She has had a history of 2 miscarriages.   Estimated excess breast tissue to be removed at time of surgery: 800 grams  PMH Significant for: History of smoking, quit about 7 years ago.  History of migraines.    History of asthma, does not need inhaler.  She reports that she never really has any issues with her asthma.  Patient is on phentermine , she is aware to hold this 1 week prior to surgery.  Patient reports she is feeling well lately, no recent changes to her health.  Past Medical History: Allergies: Allergies  Allergen Reactions   Topamax  [Topiramate ] Other (See Comments)    Blurry vision, numbness of lips    Current Medications:  Current Outpatient Medications:    Hydrocortisone  Acetate 1 % OINT, Apply 1 Application topically daily as needed., Disp: 28.4 g, Rfl: 2   ibuprofen   (ADVIL ) 600 MG tablet, Take 1 tablet (600 mg total) by mouth every 6 (six) hours as needed., Disp: 30 tablet, Rfl: 0   lidocaine  (LIDODERM ) 5 %, Place 1 patch onto the skin daily. Remove & Discard patch within 12 hours or as directed by MD, Disp: 30 patch, Rfl: 0   metoCLOPramide  (REGLAN ) 10 MG tablet, TAKE 1 TABLET (10 MG TOTAL) BY MOUTH 3 (THREE) TIMES DAILY AS NEEDED FOR NAUSEA (OR HEADACHE)., Disp: 15 tablet, Rfl: 11   phentermine  15 MG capsule, Take 1 capsule (15 mg total) by mouth every morning., Disp: 30 capsule, Rfl: 0   phentermine  15 MG capsule, Take 1 capsule (15 mg total) by mouth every morning., Disp: 30 capsule, Rfl: 0   phentermine  30 MG capsule, Take 1 capsule (30 mg total) by mouth every morning., Disp: 30 capsule, Rfl: 0   phentermine  37.5 MG capsule, Take 1 capsule (37.5 mg total) by mouth every morning., Disp: 30 capsule, Rfl: 0   rizatriptan  (MAXALT ) 10 MG tablet, Take 1 tablet (10 mg total) by mouth as needed for migraine. May repeat in 2 hours if needed, Disp: 10 tablet, Rfl: 11  Past Medical Problems: Past Medical History:  Diagnosis Date   Asthma, mild    Medical history non-contributory     Past Surgical History: Past Surgical History:  Procedure Laterality Date   APPENDECTOMY  09/2016   CESAREAN SECTION N/A 09/07/2017   Procedure:  CESAREAN SECTION;  Surgeon: Verlyn Goad, MD;  Location: [facility];  Service: Obstetrics;  Laterality: N/A;   LAPAROSCOPIC APPENDECTOMY N/A 09/29/2016   Procedure: APPENDECTOMY LAPAROSCOPIC;  Surgeon: Aldean Hummingbird, MD;  Location: [facility];  Service: General;  Laterality: N/A;   NO PAST SURGERIES     VENTRAL HERNIA REPAIR N/A 04/29/2020   Procedure: HERNIA REPAIR VENTRAL ADULT, Open;  Surgeon: Flynn Hylan, MD;  Location: [facility];  Service: General;  Laterality: N/A;    Social History: Social History   Socioeconomic History   Marital status: Significant Other    Spouse name: Not on file   Number of children: 1    Years of education: Not on file   Highest education level: 6th grade  Occupational History   Not on file  Tobacco Use   Smoking status: Former    Current packs/day: 0.00    Average packs/day: 0.3 packs/day for 10.0 years (2.5 ttl pk-yrs)    Types: Cigarettes    Start date: 04/06/2008    Quit date: 04/06/2018    Years since quitting: 6.0   Smokeless tobacco: Never  Vaping Use   Vaping status: Never Used  Substance and Sexual Activity   Alcohol use: No    Alcohol/week: 0.0 standard drinks of alcohol   Drug use: No   Sexual activity: Yes    Birth control/protection: None  Other Topics Concern   Not on file  Social History Narrative   ** Merged History Encounter **       Lives at home with boyfriend  Right handed Caffeine : 30 oz daily of coca cola   Social Drivers of Corporate investment banker Strain: Low Risk  (02/15/2024)   Overall Financial Resource Strain (CARDIA)    Difficulty of Paying Living Expenses: Not hard at all  Food Insecurity: No Food Insecurity (12/14/2023)   Hunger Vital Sign    Worried About Running Out of Food in the Last Year: Never true    Ran Out of Food in the Last Year: Never true  Transportation Needs: No Transportation Needs (02/15/2024)   PRAPARE - Administrator, Civil Service (Medical): No    Lack of Transportation (Non-Medical): No  Physical Activity: Insufficiently Active (02/15/2024)   Exercise Vital Sign    Days of Exercise per Week: 4 days    Minutes of Exercise per Session: 30 min  Stress: No Stress Concern Present (02/15/2024)   [employer] of Occupational Health - Occupational Stress Questionnaire    Feeling of Stress : Not at all  Social Connections: Moderately Isolated (02/15/2024)   Social Connection and Isolation Panel [NHANES]    Frequency of Communication with Friends and Family: More than three times a week    Frequency of Social Gatherings with Friends and Family: More than three times a week    Attends Religious  Services: Never    Database administrator or Organizations: No    Attends Banker Meetings: Never    Marital Status: Married  Catering manager Violence: Not At Risk (02/15/2024)   Humiliation, Afraid, Rape, and Kick questionnaire    Fear of Current or Ex-Partner: No    Emotionally Abused: No    Physically Abused: No    Sexually Abused: No    Family History: Family History  Problem Relation Age of Onset   Asthma Mother    Migraines Sister     Review of Systems: ROS  Physical Exam: Vital Signs BP 118/80 (BP Location:  Left Arm, Patient Position: Sitting, Cuff Size: Normal)   Pulse (!) 51   Ht 5\' 2"  (1.575 m)   Wt 182 lb (82.6 kg)   LMP 03/03/2024 (Approximate)   SpO2 93%   BMI 33.29 kg/m   Physical Exam Constitutional:      General: Not in acute distress.    Appearance: Normal appearance. Not ill-appearing.  HENT:     Head: Normocephalic and atraumatic.  Eyes:     Pupils: Pupils are equal, round Neck:     Musculoskeletal: Normal range of motion.  Cardiovascular:     Rate and Rhythm: Normal rate    Pulses: Normal pulses.  Pulmonary:     Effort: Pulmonary effort is normal. No respiratory distress.  Musculoskeletal: Normal range of motion.  Skin:    General: Skin is warm and dry.     Findings: No erythema or rash.  Neurological:     General: No focal deficit present.     Mental Status: Alert and oriented to person, place, and time. Mental status is at baseline.     Motor: No weakness.  Psychiatric:        Mood and Affect: Mood normal.        Behavior: Behavior normal.    Assessment/Plan: The patient is scheduled for bilateral breast reduction with Dr. Carolynne Citron.  Risks, benefits, and alternatives of procedure discussed, questions answered and consent obtained.    Smoking Status: quit 7 years ago; Counseling Given? N/a Last Mammogram: 02/09/24; Results: BI-RADS CATEGORY 1: Negative.   Caprini Score: 4, moderate; Risk Factors include: Age, BMI > 25,  and length of planned surgery. Recommendation for mechanical prophylaxis. Encourage early ambulation.   Pictures obtained: @consult   Post-op Rx sent to pharmacy: Oxycodone , Zofran   Patient was provided with the breast reduction and General Surgical Risk consent document and Pain Medication Agreement prior to their appointment.  They had adequate time to read through the risk consent documents and Pain Medication Agreement. We also discussed them in person together during this preop appointment. All of their questions were answered to their satisfaction.  Recommended calling if they have any further questions.  Risk consent form and Pain Medication Agreement to be scanned into patient's chart.  The risk that can be encountered with breast reduction were discussed and include the following but not limited to these:  Breast asymmetry, fluid accumulation, firmness of the breast, inability to breast feed, loss of nipple or areola, skin loss, decrease or no nipple sensation, fat necrosis of the breast tissue, bleeding, infection, healing delay.  There are risks of anesthesia, changes to skin sensation and injury to nerves or blood vessels.  The muscle can be temporarily or permanently injured.  You may have an allergic reaction to tape, suture, glue, blood products which can result in skin discoloration, swelling, pain, skin lesions, poor healing.  Any of these can lead to the need for revisonal surgery or stage procedures.  A reduction has potential to interfere with diagnostic procedures.  Nipple or breast piercing can increase risks of infection.  This procedure is best done when the breast is fully developed.  Changes in the breast will continue to occur over time.  Pregnancy can alter the outcomes of previous breast reduction surgery, weight gain and weigh loss can also effect the long term appearance.   No surgical clearance requested at consult with surgeon.  Electronically signed by: Janalyn Me  Bram Hottel, PA-C 04/12/2024 10:07 AM

## 2024-04-17 ENCOUNTER — Ambulatory Visit (INDEPENDENT_AMBULATORY_CARE_PROVIDER_SITE_OTHER): Payer: Self-pay | Admitting: Family

## 2024-04-17 ENCOUNTER — Encounter: Payer: Self-pay | Admitting: Family

## 2024-04-17 VITALS — BP 101/69 | HR 77 | Temp 98.4°F | Resp 16 | Ht 61.0 in | Wt 180.4 lb

## 2024-04-17 DIAGNOSIS — Z124 Encounter for screening for malignant neoplasm of cervix: Secondary | ICD-10-CM

## 2024-04-17 DIAGNOSIS — Z7689 Persons encountering health services in other specified circumstances: Secondary | ICD-10-CM

## 2024-04-17 DIAGNOSIS — Z130 Encounter for screening for diseases of the blood and blood-forming organs and certain disorders involving the immune mechanism: Secondary | ICD-10-CM

## 2024-04-17 DIAGNOSIS — Z6834 Body mass index (BMI) 34.0-34.9, adult: Secondary | ICD-10-CM

## 2024-04-17 DIAGNOSIS — Z603 Acculturation difficulty: Secondary | ICD-10-CM

## 2024-04-17 DIAGNOSIS — Z131 Encounter for screening for diabetes mellitus: Secondary | ICD-10-CM

## 2024-04-17 DIAGNOSIS — Z13228 Encounter for screening for other metabolic disorders: Secondary | ICD-10-CM

## 2024-04-17 DIAGNOSIS — Z0000 Encounter for general adult medical examination without abnormal findings: Secondary | ICD-10-CM

## 2024-04-17 DIAGNOSIS — Z13220 Encounter for screening for lipoid disorders: Secondary | ICD-10-CM

## 2024-04-17 DIAGNOSIS — Z1329 Encounter for screening for other suspected endocrine disorder: Secondary | ICD-10-CM

## 2024-04-17 DIAGNOSIS — E669 Obesity, unspecified: Secondary | ICD-10-CM

## 2024-04-17 DIAGNOSIS — J358 Other chronic diseases of tonsils and adenoids: Secondary | ICD-10-CM

## 2024-04-17 MED ORDER — PHENTERMINE HCL 15 MG PO CAPS: 15.0000 mg | ORAL_CAPSULE | ORAL | 0 refills | Status: DC

## 2024-04-17 NOTE — Progress Notes (Signed)
 Patient ID: Meghan Reeves, female    DOB: 04/21/1981  MRN: [id number]  CC: Annual Exam  Subjective: Meghan Reeves is a 43 y.o. female who presents for annual exam.   Her concerns today include:  - Patient up to date on mammogram. - States she never received call from ENT.  - Doing well on Phentermine , no issues/concerns. States Phentermine  30 mg causes her to become anxious.  Patient Active Problem List   Diagnosis Date Noted   Musculoskeletal disorder involving upper trapezius muscle 03/24/2022   Lipoma of neck 03/24/2022   Hypertriglyceridemia 02/25/2022   Class 2 obesity due to excess calories with body mass index (BMI) of 35.0 to 35.9 in adult 02/25/2022   Bacterial vaginosis 01/09/2021   Candida vaginitis 01/09/2021   Status post laparoscopic cholecystectomy 05/14/2020   Diastasis recti 04/25/2020   Nexplanon in place 12/14/2019   Migraine without aura and with status migrainosus, not intractable 12/08/2018   Medication overuse headache 12/08/2018   Tobacco use disorder, continuous 02/03/2017   Encounter for smoking cessation counseling 02/03/2017   Spanish speaking patient 10/01/2016     Current Outpatient Medications on File Prior to Visit  Medication Sig Dispense Refill   Hydrocortisone  Acetate 1 % OINT Apply 1 Application topically daily as needed. 28.4 g 2   ibuprofen  (ADVIL ) 600 MG tablet Take 1 tablet (600 mg total) by mouth every 6 (six) hours as needed. 30 tablet 0   lidocaine  (LIDODERM ) 5 % Place 1 patch onto the skin daily. Remove & Discard patch within 12 hours or as directed by MD 30 patch 0   metoCLOPramide  (REGLAN ) 10 MG tablet TAKE 1 TABLET (10 MG TOTAL) BY MOUTH 3 (THREE) TIMES DAILY AS NEEDED FOR NAUSEA (OR HEADACHE). 15 tablet 11   ondansetron  (ZOFRAN ) 4 MG tablet Take 1 tablet (4 mg total) by mouth every 8 (eight) hours as needed for nausea or vomiting. 20 tablet 0   oxyCODONE  (OXY IR/ROXICODONE ) 5 MG immediate release tablet Take  1 tablet (5 mg total) by mouth every 6 (six) hours as needed for up to 5 days for severe pain (pain score 7-10). 20 tablet 0   phentermine  15 MG capsule Take 1 capsule (15 mg total) by mouth every morning. 30 capsule 0   phentermine  30 MG capsule Take 1 capsule (30 mg total) by mouth every morning. 30 capsule 0   phentermine  37.5 MG capsule Take 1 capsule (37.5 mg total) by mouth every morning. 30 capsule 0   rizatriptan  (MAXALT ) 10 MG tablet Take 1 tablet (10 mg total) by mouth as needed for migraine. May repeat in 2 hours if needed 10 tablet 11   No current facility-administered medications on file prior to visit.    Allergies  Allergen Reactions   Topamax  [Topiramate ] Other (See Comments)    Blurry vision, numbness of lips    Social History   Socioeconomic History   Marital status: Significant Other    Spouse name: Not on file   Number of children: 1   Years of education: Not on file   Highest education level: 6th grade  Occupational History   Not on file  Tobacco Use   Smoking status: Former    Current packs/day: 0.00    Average packs/day: 0.3 packs/day for 10.0 years (2.5 ttl pk-yrs)    Types: Cigarettes    Start date: 04/06/2008    Quit date: 04/06/2018    Years since quitting: 6.0   Smokeless tobacco: Never  Vaping  Use   Vaping status: Never Used  Substance and Sexual Activity   Alcohol use: No    Alcohol/week: 0.0 standard drinks of alcohol   Drug use: No   Sexual activity: Yes    Birth control/protection: None  Other Topics Concern   Not on file  Social History Narrative   ** Merged History Encounter **       Lives at home with boyfriend  Right handed Caffeine : 30 oz daily of coca cola   Social Drivers of Corporate investment banker Strain: Low Risk  (02/15/2024)   Overall Financial Resource Strain (CARDIA)    Difficulty of Paying Living Expenses: Not hard at all  Food Insecurity: No Food Insecurity (12/14/2023)   Hunger Vital Sign    Worried About Running  Out of Food in the Last Year: Never true    Ran Out of Food in the Last Year: Never true  Transportation Needs: No Transportation Needs (02/15/2024)   PRAPARE - Administrator, Civil Service (Medical): No    Lack of Transportation (Non-Medical): No  Physical Activity: Insufficiently Active (02/15/2024)   Exercise Vital Sign    Days of Exercise per Week: 4 days    Minutes of Exercise per Session: 30 min  Stress: No Stress Concern Present (02/15/2024)   [employer] of Occupational Health - Occupational Stress Questionnaire    Feeling of Stress : Not at all  Social Connections: Moderately Isolated (02/15/2024)   Social Connection and Isolation Panel [NHANES]    Frequency of Communication with Friends and Family: More than three times a week    Frequency of Social Gatherings with Friends and Family: More than three times a week    Attends Religious Services: Never    Database administrator or Organizations: No    Attends Banker Meetings: Never    Marital Status: Married  Catering manager Violence: Not At Risk (02/15/2024)   Humiliation, Afraid, Rape, and Kick questionnaire    Fear of Current or Ex-Partner: No    Emotionally Abused: No    Physically Abused: No    Sexually Abused: No    Family History  Problem Relation Age of Onset   Asthma Mother    Migraines Sister     Past Surgical History:  Procedure Laterality Date   APPENDECTOMY  09/2016   CESAREAN SECTION N/A 09/07/2017   Procedure: CESAREAN SECTION;  Surgeon: Verlyn Goad, MD;  Location: [facility];  Service: Obstetrics;  Laterality: N/A;   LAPAROSCOPIC APPENDECTOMY N/A 09/29/2016   Procedure: APPENDECTOMY LAPAROSCOPIC;  Surgeon: Aldean Hummingbird, MD;  Location: [facility];  Service: General;  Laterality: N/A;   NO PAST SURGERIES     VENTRAL HERNIA REPAIR N/A 04/29/2020   Procedure: HERNIA REPAIR VENTRAL ADULT, Open;  Surgeon: Flynn Hylan, MD;  Location: [facility];  Service: General;   Laterality: N/A;    ROS: Review of Systems Negative except as stated above  PHYSICAL EXAM: BP 101/69   Pulse 77   Temp 98.4 F (36.9 C) (Oral)   Resp 16   Ht 5\' 1"  (1.549 m)   Wt 180 lb 6.4 oz (81.8 kg)   LMP 03/25/2024 (Exact Date)   SpO2 98%   BMI 34.09 kg/m   Wt Readings from Last 3 Encounters:  04/17/24 180 lb 6.4 oz (81.8 kg)  04/12/24 182 lb (82.6 kg)  03/17/24 182 lb (82.6 kg)   Physical Exam HENT:     Head: Normocephalic and atraumatic.  Right Ear: Tympanic membrane, ear canal and external ear normal.     Left Ear: Tympanic membrane, ear canal and external ear normal.     Nose: Nose normal.     Mouth/Throat:     Mouth: Mucous membranes are moist.     Pharynx: Oropharynx is clear.  Eyes:     Extraocular Movements: Extraocular movements intact.     Conjunctiva/sclera: Conjunctivae normal.     Pupils: Pupils are equal, round, and reactive to light.  Neck:     Thyroid : No thyroid  mass, thyromegaly or thyroid  tenderness.  Cardiovascular:     Rate and Rhythm: Normal rate and regular rhythm.     Pulses: Normal pulses.     Heart sounds: Normal heart sounds.  Pulmonary:     Effort: Pulmonary effort is normal.     Breath sounds: Normal breath sounds.  Chest:     Comments: Patient declined. Abdominal:     General: Bowel sounds are normal.     Palpations: Abdomen is soft.  Genitourinary:    Comments: Patient declined. Musculoskeletal:        General: Normal range of motion.     Right shoulder: Normal.     Left shoulder: Normal.     Right upper arm: Normal.     Left upper arm: Normal.     Right elbow: Normal.     Left elbow: Normal.     Right forearm: Normal.     Left forearm: Normal.     Right wrist: Normal.     Left wrist: Normal.     Right hand: Normal.     Left hand: Normal.     Cervical back: Normal, normal range of motion and neck supple.     Thoracic back: Normal.     Lumbar back: Normal.     Right hip: Normal.     Left hip: Normal.      Right upper leg: Normal.     Left upper leg: Normal.     Right knee: Normal.     Left knee: Normal.     Right lower leg: Normal.     Left lower leg: Normal.     Right ankle: Normal.     Left ankle: Normal.     Right foot: Normal.     Left foot: Normal.  Skin:    General: Skin is warm and dry.     Capillary Refill: Capillary refill takes less than 2 seconds.  Neurological:     General: No focal deficit present.     Mental Status: She is alert and oriented to person, place, and time.  Psychiatric:        Mood and Affect: Mood normal.        Behavior: Behavior normal.     ASSESSMENT AND PLAN: 1. Annual physical exam (Primary) - Counseled on 150 minutes of exercise per week as tolerated, healthy eating (including decreased daily intake of saturated fats, cholesterol, added sugars, sodium), STI prevention, and routine healthcare maintenance.  2. Screening for metabolic disorder - Routine screening.  - CMP14+EGFR  3. Screening for deficiency anemia - Routine screening.  - CBC  4. Diabetes mellitus screening - Routine screening.  - Hemoglobin A1c  5. Screening cholesterol level - Routine screening.  - Lipid panel  6. Thyroid  disorder screen - Routine screening.  - TSH  7. Cervical cancer screening - Referral to Gynecology for evaluation/management. - Ambulatory referral to Gynecology  8. Tonsillolith - Referral to ENT for evaluation/management. -  Ambulatory referral to ENT  9. Encounter for weight management 10. BMI 34.0-34.9,adult - Patient lost 2 pounds since previous office visit.  - Continue Phentermine  as prescribed. Counseled on medication adherence/adverse effects.  - Follow-up with primary provider in 4 weeks or sooner if needed. - phentermine  15 MG capsule; Take 1 capsule (15 mg total) by mouth every morning.  Dispense: 30 capsule; Refill: 0  11. Language barrier - [employer]. ID#: [id number].     Patient was given the opportunity to ask  questions.  Patient verbalized understanding of the plan and was able to repeat key elements of the plan. Patient was given clear instructions to go to Emergency Department or return to medical center if symptoms don't improve, worsen, or new problems develop.The patient verbalized understanding.   Orders Placed This Encounter  Procedures   CBC   Lipid panel   CMP14+EGFR   Hemoglobin A1c   TSH   Ambulatory referral to ENT   Ambulatory referral to Gynecology     Requested Prescriptions   Signed Prescriptions Disp Refills   phentermine  15 MG capsule 30 capsule 0    Sig: Take 1 capsule (15 mg total) by mouth every morning.    Return in about 1 year (around 04/17/2025) for Physical per patient preference.  Senaida Dama, NP

## 2024-04-17 NOTE — Progress Notes (Signed)
 Lab work

## 2024-04-24 ENCOUNTER — Telehealth: Payer: Self-pay | Admitting: *Deleted

## 2024-04-24 NOTE — Telephone Encounter (Signed)
 Patient called and aware labs have not been resulted yet.

## 2024-04-25 ENCOUNTER — Ambulatory Visit: Payer: Self-pay | Admitting: Family

## 2024-04-25 ENCOUNTER — Ambulatory Visit: Payer: Self-pay

## 2024-04-25 DIAGNOSIS — Z13228 Encounter for screening for other metabolic disorders: Secondary | ICD-10-CM

## 2024-04-25 DIAGNOSIS — Z1329 Encounter for screening for other suspected endocrine disorder: Secondary | ICD-10-CM

## 2024-04-25 DIAGNOSIS — Z13220 Encounter for screening for lipoid disorders: Secondary | ICD-10-CM

## 2024-04-25 LAB — CBC
Hematocrit: 40.8 % (ref 34.0–46.6)
Hemoglobin: 13.6 g/dL (ref 11.1–15.9)
MCH: 31.7 pg (ref 26.6–33.0)
MCHC: 33.3 g/dL (ref 31.5–35.7)
MCV: 95 fL (ref 79–97)
Platelets: 243 10*3/uL (ref 150–450)
RBC: 4.29 x10E6/uL (ref 3.77–5.28)
RDW: 13.1 % (ref 11.7–15.4)
WBC: 8.9 10*3/uL (ref 3.4–10.8)

## 2024-04-25 LAB — CMP14+EGFR
ALT: 14 IU/L (ref 0–32)
AST: 22 IU/L (ref 0–40)
Albumin: 4.6 g/dL (ref 3.9–4.9)
Alkaline Phosphatase: 81 IU/L (ref 44–121)
BUN/Creatinine Ratio: 15 (ref 9–23)
BUN: 12 mg/dL (ref 6–24)
Bilirubin Total: <0.2 mg/dL (ref 0.0–1.2)
CO2: 18 mmol/L — ABNORMAL LOW (ref 20–29)
Calcium: 9.6 mg/dL (ref 8.7–10.2)
Creatinine, Ser: 0.8 mg/dL (ref 0.57–1.00)
Globulin, Total: 3.2 g/dL (ref 1.5–4.5)
Glucose: 98 mg/dL (ref 70–99)
Total Protein: 7.8 g/dL (ref 6.0–8.5)
eGFR: 94 mL/min/{1.73_m2} (ref >59)

## 2024-04-25 LAB — LIPID PANEL
Chol/HDL Ratio: 4.2 ratio (ref 0.0–4.4)
Cholesterol, Total: 223 mg/dL — ABNORMAL HIGH (ref 100–199)
HDL: 53 mg/dL (ref >39)
LDL Chol Calc (NIH): 134 mg/dL — ABNORMAL HIGH (ref 0–99)
Triglycerides: 203 mg/dL — ABNORMAL HIGH (ref 0–149)
VLDL Cholesterol Cal: 36 mg/dL (ref 5–40)

## 2024-04-25 LAB — TSH: TSH: 5.09 u[IU]/mL — ABNORMAL HIGH (ref 0.450–4.500)

## 2024-04-25 LAB — HEMOGLOBIN A1C
Est. average glucose Bld gHb Est-mCnc: 111 mg/dL
Hgb A1c MFr Bld: 5.5 % (ref 4.8–5.6)

## 2024-04-25 NOTE — Telephone Encounter (Signed)
  FYI Only or Action Required?: Action required by provider  Patient was last seen in primary care on 04/17/2024 by Senaida Dama, NP. Called Nurse Triage reporting Advice Only. Symptoms began n/a. Interventions attempted: Other: n/a. Symptoms are: stable.  Triage Disposition: Call PCP When Office is Open  Patient/caregiver understands and will follow disposition?: Yes   Copied from CRM [id number]. Topic: Clinical - Lab/Test Results >> Apr 25, 2024  4:11 PM Meghan Reeves wrote: Reason for CRM: patient calling to discuss triglycerides from her lab appt  Pt num [phone] (M) Reason for Disposition  [1] Caller requesting NON-URGENT health information AND [2] PCP's office is the best resource  Answer Assessment - Initial Assessment Questions 1. REASON FOR CALL or QUESTION: What is your reason for calling today? or How can I best help you? or What question do you have that I can help answer?  Lab results read to patient from chart per A. Rogerio Clay, NP     - Kidney function normal. - Liver function normal. - No anemia. - No diabetes.   The following abnormalities are noted:   - Cholesterol above goal. - Thyroid  function mildly above goal.   All other values are normal, stable or within acceptable limits.   Medication changes / Follow up labs / Other changes or recommendations:   - Several labs from BMP were canceled by [facility]. New BMP ordered. Schedule lab only appointment to have recollected. - Schedule lab only appointment to recheck fasting cholesterol in 4 to 6 weeks. - Schedule lab only appointment to recheck thyroid  function in 4 to 6 weeks.     Please contact patient regarding thyroid  and triglycerides.  She would like reassurance that these abnormal levels will not affect her upcoming surgery date.  Protocols used: Information Only Call - No Triage-A-AH

## 2024-04-26 ENCOUNTER — Other Ambulatory Visit: Payer: Self-pay

## 2024-04-26 ENCOUNTER — Telehealth: Payer: Self-pay | Admitting: Family

## 2024-04-26 NOTE — Telephone Encounter (Signed)
 Copied from CRM [id number]. Topic: Clinical - Lab/Test Results >> Apr 26, 2024  1:59 PM Sanjuana Crutch wrote: Reason for CRM: patient called in due to a missed call about lab results, transferred to CAL line but CMA was busy. Was told to send over a message and shell be giving the patient a call back shortly.

## 2024-04-26 NOTE — Telephone Encounter (Signed)
 Meghan Reeves I have attempted without success to contact this patient by phone to return their call, discuss lab results, and I left a message on answering machine.

## 2024-04-27 NOTE — Telephone Encounter (Signed)
 I called patient with interpreter Dina Francisco [phone] and no one answered so I left a message to return my call.

## 2024-04-28 ENCOUNTER — Other Ambulatory Visit: Payer: Self-pay

## 2024-04-28 DIAGNOSIS — Z13228 Encounter for screening for other metabolic disorders: Secondary | ICD-10-CM

## 2024-04-29 LAB — BASIC METABOLIC PANEL WITH GFR
BUN/Creatinine Ratio: 11 (ref 9–23)
BUN: 8 mg/dL (ref 6–24)
CO2: 21 mmol/L (ref 20–29)
Calcium: 9.6 mg/dL (ref 8.7–10.2)
Chloride: 105 mmol/L (ref 96–106)
Creatinine, Ser: 0.71 mg/dL (ref 0.57–1.00)
Glucose: 106 mg/dL — ABNORMAL HIGH (ref 70–99)
Potassium: 3.9 mmol/L (ref 3.5–5.2)
Sodium: 141 mmol/L (ref 134–144)
eGFR: 109 mL/min/{1.73_m2} (ref >59)

## 2024-04-29 NOTE — Telephone Encounter (Signed)
 Pet came in office 04/28/2024 and we talked to her

## 2024-05-01 ENCOUNTER — Ambulatory Visit: Payer: Self-pay | Admitting: Family

## 2024-05-01 ENCOUNTER — Encounter: Payer: Self-pay | Admitting: Physician Assistant

## 2024-05-01 ENCOUNTER — Encounter: Payer: Self-pay | Admitting: Surgical

## 2024-05-08 ENCOUNTER — Other Ambulatory Visit: Payer: Self-pay

## 2024-05-08 DIAGNOSIS — N62 Hypertrophy of breast: Secondary | ICD-10-CM

## 2024-05-09 ENCOUNTER — Encounter: Payer: Self-pay | Admitting: Surgical

## 2024-05-09 ENCOUNTER — Ambulatory Visit (INDEPENDENT_AMBULATORY_CARE_PROVIDER_SITE_OTHER): Payer: Self-pay | Admitting: Surgical

## 2024-05-09 ENCOUNTER — Encounter: Payer: Self-pay | Admitting: Physician Assistant

## 2024-05-09 VITALS — BP 112/74 | HR 80

## 2024-05-09 DIAGNOSIS — M546 Pain in thoracic spine: Secondary | ICD-10-CM

## 2024-05-09 DIAGNOSIS — N62 Hypertrophy of breast: Secondary | ICD-10-CM

## 2024-05-09 NOTE — Progress Notes (Signed)
 Patient is a 43 y.o.-year-old female status post bilateral breast reduction with Dr.  Waddell. Patient is 1 day postop.  She presents today with Spanish interpreter.  She reports that she has been overall feeling well, has been drinking plenty of fluids and eating normally.  She reports normal urination.  She has not had a bowel movement yet.  She is not having any other issues or concerns.  She does not report any issues with pain, feels as if pain is well-controlled at this time.  She does report that she noticed at 1 point the right JP drain bulb was not completely compressed.  Chaperone present on exam BP 112/74 (BP Location: Right Arm, Patient Position: Sitting, Cuff Size: Large)   Pulse 80   LMP 03/25/2024 (Exact Date)   SpO2 95%  Patient is well-developed, well-nourished, no acute distress.  Breathing is unlabored.  No lower extremity swelling is noted. Bilateral NAC's are viable, bilateral breast incisions are intact. There is no erythema or cellulitic changes noted. No obvious subcutaneous fluid collections noted with palpation. She does have ecchymosis of the right breast.  Drainage bulb Drainage and bilateral JP drains is serosanguineous, but still slightly bloody appearing.  No obvious continuous drainage noted.  She does have some slight increased swelling of the right lateral breast, however no obvious hematoma noted with palpation.  A/P:  While here in the clinic, she did get lightheaded and vasovagal response.  She was placed in the reverse Trendelenburg position and quickly recovered.  She then slowly returned to the sitting and subsequently standing position without issue.  She felt well.  She is here with her neighbor who is assisting her with travel/transportation.  I do not suspect that she is having any bleeding despite slightly bloody serosanguineous drainage in bilateral JP drain bulbs.  We did elect to leave drains in place for 1 more day given the drainage color.  In  regards to constipation, recommend starting MiraLAX  and increasing water intake.  Recommend continuing with compressive garment 24/7 until 6 weeks post-op,  avoiding strenuous activity/heavy lifting until 6 weeks post-op  Recommend following up tomorrow for bilateral JP drain removal.  All of the patient's questions were answered to their content. Recommend calling with any questions or concerns.

## 2024-05-10 ENCOUNTER — Ambulatory Visit (INDEPENDENT_AMBULATORY_CARE_PROVIDER_SITE_OTHER): Payer: Self-pay | Admitting: Student

## 2024-05-10 DIAGNOSIS — N62 Hypertrophy of breast: Secondary | ICD-10-CM

## 2024-05-10 NOTE — Progress Notes (Signed)
 Patient is a 43 year old female who underwent bilateral breast reduction with Dr. Waddell on 05/08/2024.  She is 2 days postop.  She presents to the clinic today for possible drain removal.  Patient was seen in the clinic yesterday.  At this visit, patient reported she was feeling well.  On exam, bilateral NAC's were viable, incisions were intact.  There is no erythema or cellulitic changes.  There was some ecchymosis to the right breast.  Drainage bulb drainage was serosanguineous, but slightly bloody bilaterally.  Plan was to leave drains in for 1 more day given drainage color.  Video-assisted Spanish interpreting services were present during today's visit.  Today, patient reports she is doing well.  She states that the oxycodone  sometimes makes her dizzy, but otherwise she is doing well. She does not report any chest pain or shortness of breath.  She denies any fevers or chills.  She denies any nausea or vomiting.  Patient states that she has not yet had a bowel movement.  Patient reports that her drains have put out about 15 to 20 cc from each drain since yesterday.  Chaperone present on exam.  On exam, patient is sitting upright in no acute distress.  Breasts are soft and fairly symmetric.  Right breast appears to be slightly more swollen than the left.  There is overlying ecchymosis to both of the breast.  NAC's appear to be healthy bilaterally.  Incisions are clean dry and intact.  There are no obvious fluid collections noted on exam.  JP drains were in place and functioning with minimal serosanguineous drainage in each of the bulbs.  JP drains were removed without any difficulty.  Patient tolerated well.  Discussed with patient that her drain sites may drain a little bit over the next few days.  Recommended that she place gauze and tape over the drain sites daily or as needed if saturated.  Patient expressed understanding.  Discussed with patient the importance of wearing compression at all times  and avoiding strenuous activities.  Recommended that she try to avoid taking oxycodone  and try to focus on taking Tylenol  and ibuprofen  to see if that would cover her pain.  Patient expressed understanding.  Encouraged the patient to stay hydrated and drink plenty of water/fluids.  Recommended that she start taking MiraLAX  to see if this might help with her bowel movements.  Patient expressed understanding.  Patient to follow back up in the clinic next week at her neck scheduled appointment.  I instructed her to call in the meantime if she has any questions or concerns about anything.

## 2024-05-11 ENCOUNTER — Telehealth: Payer: Self-pay | Admitting: Plastic Surgery

## 2024-05-11 ENCOUNTER — Encounter: Payer: Self-pay | Admitting: Student

## 2024-05-11 LAB — SURGICAL PATHOLOGY

## 2024-05-11 NOTE — Telephone Encounter (Signed)
 Patient would like to know if she is able to bathe and when is ok to where the top given to her underneath her clothing,. Please advise

## 2024-05-11 NOTE — Telephone Encounter (Signed)
 She can shower, no baths/pool/hot tub. She should remove binder and any gauze prior.

## 2024-05-15 ENCOUNTER — Encounter: Payer: Self-pay | Admitting: Plastic Surgery

## 2024-05-15 ENCOUNTER — Telehealth: Payer: Self-pay

## 2024-05-15 NOTE — Telephone Encounter (Signed)
 The patient called in with a question about drainage. I spoke to her on the phone using an interpreter. She stated she was still having drainage coming for the drain insertion sites. I told her that is to be expected since the drains were just pulled on 7/2. I told her she can take the old gauze off and replace with new gauze. She had an appointment scheduled today with Dr. Waddell, but she missed it stating that there was no reminder. Alesha got her re-scheduled to come in tomorrow with one of the PA s.

## 2024-05-16 ENCOUNTER — Ambulatory Visit: Payer: Self-pay | Admitting: Surgical

## 2024-05-16 VITALS — BP 120/76 | HR 95

## 2024-05-16 DIAGNOSIS — N62 Hypertrophy of breast: Secondary | ICD-10-CM

## 2024-05-16 NOTE — Progress Notes (Signed)
 Patient is a very pleasant 43 year old female here for follow-up after breast reduction.  She is here with Spanish interpreter.  She had a breast reduction on 05/08/2024.  She is about 1 week postop.  She reports she is overall doing well.  She reports she has been using MiraLAX  and is now having normal bowel movements.  She is not having any infectious symptoms.  She does have questions about drain site care and dressing changes.  She reports she has not changed the drain site bandages since her last appointment about 6 days ago because she was not sure that she was supposed to.  Chaperone present on exam On exam bilateral NACs are viable, bilateral breast incisions are intact and appear to be healing well.  JP drain insertion site wounds have nearly completely closed.  She does have ecchymosis noted of bilateral breast, but no subcutaneous fluid collection or hematoma noted with palpation.  Breast symmetry is noted.  There is no signs of infection on exam.  A/P:  Patient is overall doing well, no signs infection or concern.  Dressings were changed for her.  Recommend following up in 1 week for reevaluation.  All of her questions were answered to her content.  Spanish interpreter was present throughout the entire encounter.  Continue with compressive garment, avoid strenuous  activities or heavy lifting.

## 2024-05-23 ENCOUNTER — Ambulatory Visit (INDEPENDENT_AMBULATORY_CARE_PROVIDER_SITE_OTHER): Payer: Self-pay | Admitting: Surgical

## 2024-05-23 ENCOUNTER — Ambulatory Visit: Payer: Self-pay | Admitting: Family

## 2024-05-23 VITALS — BP 120/76 | HR 77

## 2024-05-23 DIAGNOSIS — M546 Pain in thoracic spine: Secondary | ICD-10-CM

## 2024-05-23 DIAGNOSIS — N62 Hypertrophy of breast: Secondary | ICD-10-CM

## 2024-05-23 NOTE — Progress Notes (Signed)
 Patient is a 43 y.o.-year-old female status post bilateral breast reduction with Dr.  Waddell on 05/08/2024.  Patient is 2 weeks postop.  Patient presents with Spanish interpreter.  She reports she is overall doing well.  She reports some mild itching along the incisions, but has not noticed any rashes.  She does not report any infectious symptoms.   Chaperone present on exam Bilateral NAC's are viable, bilateral breast incisions are intact. There is no erythema or cellulitic changes noted. No obvious subcutaneous fluid collections noted with palpation. Mild ecchymosis still present along medial breast bilaterally. Lateral breast fullness noted bilaterally, but no obvious fluid collections or abnormalities noted.  A/P:  Discussed the importance of continuing to wear compressive garments.  Discussed that she can continue light exercise, but avoid any weight lifting.  Recommend following up in 2 to 3 weeks for reevaluation.  All of the patient's questions were answered to their content. Recommend calling with any questions or concerns.

## 2024-05-30 ENCOUNTER — Encounter: Payer: Self-pay | Admitting: Physician Assistant

## 2024-05-30 ENCOUNTER — Encounter: Payer: Self-pay | Admitting: Surgical

## 2024-06-12 ENCOUNTER — Ambulatory Visit (INDEPENDENT_AMBULATORY_CARE_PROVIDER_SITE_OTHER): Payer: Self-pay | Admitting: Surgical

## 2024-06-12 VITALS — BP 117/78 | HR 98

## 2024-06-12 DIAGNOSIS — N62 Hypertrophy of breast: Secondary | ICD-10-CM

## 2024-06-12 DIAGNOSIS — M546 Pain in thoracic spine: Secondary | ICD-10-CM

## 2024-06-12 NOTE — Progress Notes (Signed)
 Patient is a 43 y.o.-year-old female status post bilateral breast reduction with Dr.  Waddell. Patient is 5 weeks postop.  She reports she is overall doing well.  She has noticed a little bit of drainage from 2 areas on her bilateral breast.  She is not having any infectious symptoms.  She is overall very happy.  She is interested in abdominoplasty pricing  She reports having some itching along the incisions and on the sides of her breast as well   Chaperone present on exam Bilateral NAC's are viable, bilateral breast incisions are intact. There is no erythema or cellulitic changes noted. No obvious subcutaneous fluid collections noted with palpation. She does have some Dermabond still present on bilateral breast which is flaking and she has some dry skin present.   A/P:  Recommend continuing with compressive garment 24/7 until 6 weeks post-op,  avoiding strenuous activity/heavy lifting until 6 weeks post-op  Recommend following up in 3 weeks for reevaluation.  All of the patient's questions were answered to their content. Recommend calling with any questions or concerns.  Pictures were obtained of the patient and placed in the chart with the patient's or guardian's permission.

## 2024-06-28 ENCOUNTER — Ambulatory Visit (INDEPENDENT_AMBULATORY_CARE_PROVIDER_SITE_OTHER): Payer: Self-pay | Admitting: Family

## 2024-06-28 ENCOUNTER — Encounter: Payer: Self-pay | Admitting: Family

## 2024-06-28 VITALS — BP 110/73 | HR 83 | Temp 98.4°F | Resp 16 | Ht 61.0 in | Wt 170.4 lb

## 2024-06-28 DIAGNOSIS — Z6832 Body mass index (BMI) 32.0-32.9, adult: Secondary | ICD-10-CM

## 2024-06-28 DIAGNOSIS — Z7689 Persons encountering health services in other specified circumstances: Secondary | ICD-10-CM

## 2024-06-28 DIAGNOSIS — R635 Abnormal weight gain: Secondary | ICD-10-CM

## 2024-06-28 MED ORDER — PHENTERMINE HCL 15 MG PO CAPS: 15.0000 mg | ORAL_CAPSULE | ORAL | 0 refills | Status: DC

## 2024-06-28 NOTE — Progress Notes (Unsigned)
 Patient ID: Meghan Reeves, female    DOB: [birth year]  MRN: [id number]  CC: Weight Check  Subjective: Meghan Reeves is a 43 y.o. female who presents for weight check.  Her concerns today include: ***  Patient Active Problem List   Diagnosis Date Noted   Musculoskeletal disorder involving upper trapezius muscle 03/24/2022   Lipoma of neck 03/24/2022   Hypertriglyceridemia 02/25/2022   Class 2 obesity due to excess calories with body mass index (BMI) of 35.0 to 35.9 in adult 02/25/2022   Bacterial vaginosis 01/09/2021   Candida vaginitis 01/09/2021   Status post laparoscopic cholecystectomy 05/14/2020   Diastasis recti 04/25/2020   Nexplanon in place 12/14/2019   Migraine without aura and with status migrainosus, not intractable 12/08/2018   Medication overuse headache 12/08/2018   Tobacco use disorder, continuous 02/03/2017   Encounter for smoking cessation counseling 02/03/2017   Spanish speaking patient 10/01/2016     Current Outpatient Medications on File Prior to Visit  Medication Sig Dispense Refill   Hydrocortisone  Acetate 1 % OINT Apply 1 Application topically daily as needed. 28.4 g 2   ibuprofen  (ADVIL ) 600 MG tablet Take 1 tablet (600 mg total) by mouth every 6 (six) hours as needed. 30 tablet 0   lidocaine  (LIDODERM ) 5 % Place 1 patch onto the skin daily. Remove & Discard patch within 12 hours or as directed by MD 30 patch 0   metoCLOPramide  (REGLAN ) 10 MG tablet TAKE 1 TABLET (10 MG TOTAL) BY MOUTH 3 (THREE) TIMES DAILY AS NEEDED FOR NAUSEA (OR HEADACHE). 15 tablet 11   ondansetron  (ZOFRAN ) 4 MG tablet Take 1 tablet (4 mg total) by mouth every 8 (eight) hours as needed for nausea or vomiting. 20 tablet 0   phentermine  15 MG capsule Take 1 capsule (15 mg total) by mouth every morning. 30 capsule 0   rizatriptan  (MAXALT ) 10 MG tablet Take 1 tablet (10 mg total) by mouth as needed for migraine. May repeat in 2 hours if needed 10 tablet 11    phentermine  15 MG capsule Take 1 capsule (15 mg total) by mouth every morning. (Patient not taking: Reported on 06/28/2024) 30 capsule 0   phentermine  30 MG capsule Take 1 capsule (30 mg total) by mouth every morning. (Patient not taking: Reported on 06/28/2024) 30 capsule 0   phentermine  37.5 MG capsule Take 1 capsule (37.5 mg total) by mouth every morning. (Patient not taking: Reported on 06/28/2024) 30 capsule 0   No current facility-administered medications on file prior to visit.    Allergies  Allergen Reactions   Topamax  [Topiramate ] Other (See Comments)    Blurry vision, numbness of lips    Social History   Socioeconomic History   Marital status: Significant Other    Spouse name: Not on file   Number of children: 1   Years of education: Not on file   Highest education level: 6th grade  Occupational History   Not on file  Tobacco Use   Smoking status: Former    Current packs/day: 0.00    Average packs/day: 0.3 packs/day for 10.0 years (2.5 ttl pk-yrs)    Types: Cigarettes    Start date: 04/06/2008    Quit date: 04/06/2018    Years since quitting: 6.2   Smokeless tobacco: Never  Vaping Use   Vaping status: Never Used  Substance and Sexual Activity   Alcohol use: No    Alcohol/week: 0.0 standard drinks of alcohol   Drug use: No   Sexual  activity: Yes    Birth control/protection: None  Other Topics Concern   Not on file  Social History Narrative   ** Merged History Encounter **       Lives at home with boyfriend  Right handed Caffeine : 30 oz daily of coca cola   Social Drivers of Corporate investment banker Strain: Low Risk  (02/15/2024)   Overall Financial Resource Strain (CARDIA)    Difficulty of Paying Living Expenses: Not hard at all  Food Insecurity: No Food Insecurity (12/14/2023)   Hunger Vital Sign    Worried About Running Out of Food in the Last Year: Never true    Ran Out of Food in the Last Year: Never true  Transportation Needs: No Transportation Needs  (02/15/2024)   PRAPARE - Administrator, Civil Service (Medical): No    Lack of Transportation (Non-Medical): No  Physical Activity: Insufficiently Active (02/15/2024)   Exercise Vital Sign    Days of Exercise per Week: 4 days    Minutes of Exercise per Session: 30 min  Stress: No Stress Concern Present (02/15/2024)   [employer] of Occupational Health - Occupational Stress Questionnaire    Feeling of Stress : Not at all  Social Connections: Moderately Isolated (02/15/2024)   Social Connection and Isolation Panel    Frequency of Communication with Friends and Family: More than three times a week    Frequency of Social Gatherings with Friends and Family: More than three times a week    Attends Religious Services: Never    Database administrator or Organizations: No    Attends Banker Meetings: Never    Marital Status: Married  Catering manager Violence: Not At Risk (02/15/2024)   Humiliation, Afraid, Rape, and Kick questionnaire    Fear of Current or Ex-Partner: No    Emotionally Abused: No    Physically Abused: No    Sexually Abused: No    Family History  Problem Relation Age of Onset   Asthma Mother    Migraines Sister     Past Surgical History:  Procedure Laterality Date   APPENDECTOMY  09/2016   CESAREAN SECTION N/A 09/07/2017   Procedure: CESAREAN SECTION;  Surgeon: Alger Gong, MD;  Location: [facility];  Service: Obstetrics;  Laterality: N/A;   LAPAROSCOPIC APPENDECTOMY N/A 09/29/2016   Procedure: APPENDECTOMY LAPAROSCOPIC;  Surgeon: Camellia Blush, MD;  Location: [facility];  Service: General;  Laterality: N/A;   NO PAST SURGERIES     VENTRAL HERNIA REPAIR N/A 04/29/2020   Procedure: HERNIA REPAIR VENTRAL ADULT, Open;  Surgeon: Lane Shope, MD;  Location: [facility];  Service: General;  Laterality: N/A;    ROS: Review of Systems Negative except as stated above  PHYSICAL EXAM: BP 110/73   Pulse 83   Temp 98.4 F (36.9 C) (Oral)    Resp 16   Ht 5' 1 (1.549 m)   Wt 170 lb 6.4 oz (77.3 kg)   LMP 06/23/2024 (Exact Date)   SpO2 96%   Breastfeeding No   BMI 32.20 kg/m   Wt Readings from Last 3 Encounters:  06/28/24 170 lb 6.4 oz (77.3 kg)  04/17/24 180 lb 6.4 oz (81.8 kg)  04/12/24 182 lb (82.6 kg)    Physical Exam  {female adult master:310786} {female adult master:310785}     Latest Ref Rng & Units 04/28/2024    3:07 PM 04/17/2024   10:18 AM 09/26/2023   10:02 PM  CMP  Glucose 70 -  99 mg/dL 893  98  884   BUN 6 - 24 mg/dL 8  12  12    Creatinine 0.57 - 1.00 mg/dL 9.28  9.19  9.27   Sodium 134 - 144 mmol/L 141  CANCELED  134   Potassium 3.5 - 5.2 mmol/L 3.9  CANCELED  3.9   Chloride 96 - 106 mmol/L 105  CANCELED  105   CO2 20 - 29 mmol/L 21  18  22    Calcium 8.7 - 10.2 mg/dL 9.6  9.6  8.7   Total Protein 6.0 - 8.5 g/dL  7.8  6.7   Total Bilirubin 0.0 - 1.2 mg/dL  <9.7  0.6   Alkaline Phos 44 - 121 IU/L  81  68   AST 0 - 40 IU/L  22  22   ALT 0 - 32 IU/L  14  19    Lipid Panel     Component Value Date/Time   CHOL 223 (H) 04/17/2024 1018   TRIG 203 (H) 04/17/2024 1018   HDL 53 04/17/2024 1018   CHOLHDL 4.2 04/17/2024 1018   LDLCALC 134 (H) 04/17/2024 1018    CBC    Component Value Date/Time   WBC 8.9 04/17/2024 1018   WBC 10.1 09/26/2023 2202   RBC 4.29 04/17/2024 1018   RBC 4.22 09/26/2023 2202   HGB 13.6 04/17/2024 1018   HCT 40.8 04/17/2024 1018   PLT 243 04/17/2024 1018   MCV 95 04/17/2024 1018   MCH 31.7 04/17/2024 1018   MCH 31.0 09/26/2023 2202   MCHC 33.3 04/17/2024 1018   MCHC 34.7 09/26/2023 2202   RDW 13.1 04/17/2024 1018   LYMPHSABS 2.5 02/25/2022 1051   MONOABS 0.5 04/29/2020 1108   EOSABS 0.3 02/25/2022 1051   BASOSABS 0.0 02/25/2022 1051    ASSESSMENT AND PLAN:  There are no diagnoses linked to this encounter.   Patient was given the opportunity to ask questions.  Patient verbalized understanding of the plan and was able to repeat key elements of the plan. Patient  was given clear instructions to go to Emergency Department or return to medical center if symptoms don't improve, worsen, or new problems develop.The patient verbalized understanding.   No orders of the defined types were placed in this encounter.    Requested Prescriptions    No prescriptions requested or ordered in this encounter    Return in about 4 weeks (around 07/26/2024) for Follow-Up or next available weight check .  Greig JINNY Drones, NP

## 2024-07-03 NOTE — Progress Notes (Unsigned)
 Patient is a pleasant 43 year old female s/p bilateral breast reduction performed 05/08/2024 by Dr. Waddell who presents to clinic for postoperative follow-up.  She was last seen here in clinic on 06/12/2024.  At that time, exam was benign and she was doing well from a postoperative standpoint.  Follow-up in 3 weeks for likely final postoperative encounter.  Pictures were obtained and placed in chart.  Today, patient is doing well.  She is companied by translator at bedside.  She states that she still has some mild residual soreness to bilateral breasts that is exacerbated by movement and engagement.  However, states that it has improved dramatically in the past few weeks.  She denies any fevers, difficulty breathing, nausea, diaphoresis, exertional dyspnea, leg swelling, or other symptoms.  She is a bit bothered by an area of mild firmness medially on left inframammary incision that is tender to touch.  She also feels as though she has a standing cone deformity right breast on the lateral aspect of inframammary incision.  On exam, breasts have excellent shape and symmetry.  NAC's are healthy.  Incisions well-healed throughout.  She does have an area of slight firmness medially on left inframammary incision that is TTP.  No fluctuance, crepitus, or overlying erythema.  Suspect small area of fat necrosis that will likely improve with mechanical massage and time.  She does have a very small standing cone deformity on right breast, lateral aspect of inframammary incision.  Recommending mechanic massage to the area of firmness.  Discussed silicone scar gel twice daily x 3 months.  She will follow-up in 6 months with Dr. Waddell if she is still at all bothered by the slight cone deformity lateral aspect right inframammary incision.  Otherwise, follow-up only as needed.  Cleared of restrictions.  Picture(s) obtained of the patient and placed in the chart were with the patient's or guardian's permission

## 2024-07-04 ENCOUNTER — Encounter: Payer: Self-pay | Admitting: Surgical

## 2024-07-04 ENCOUNTER — Ambulatory Visit (INDEPENDENT_AMBULATORY_CARE_PROVIDER_SITE_OTHER): Payer: Self-pay | Admitting: Physician Assistant

## 2024-07-04 ENCOUNTER — Telehealth: Payer: Self-pay | Admitting: Physician Assistant

## 2024-07-04 VITALS — BP 113/79 | HR 79

## 2024-07-04 DIAGNOSIS — Z9889 Other specified postprocedural states: Secondary | ICD-10-CM

## 2024-07-04 NOTE — Telephone Encounter (Signed)
 DO NOT SIGN until I reach pt, we are waiting on silagen scar cream and will call pt when it arives

## 2024-07-25 ENCOUNTER — Telehealth: Payer: Self-pay | Admitting: Plastic Surgery

## 2024-07-25 NOTE — Telephone Encounter (Signed)
 pt called using an interpreter and says she has pain in her breast and wants to see a provider, she is sched thurs, I asked for Wed and she has to work. If you have to call pt back, you will need an interpreter. Thank you

## 2024-07-26 NOTE — Telephone Encounter (Signed)
 Pt just called and said she can not come tomorrow her son now has an apt, she requested to come next week, Dr Waddell is out of office next week, I put the pt with PA [facility] b/c she has seen him before

## 2024-07-27 ENCOUNTER — Ambulatory Visit: Payer: Self-pay | Admitting: Plastic Surgery

## 2024-08-03 ENCOUNTER — Ambulatory Visit (INDEPENDENT_AMBULATORY_CARE_PROVIDER_SITE_OTHER): Payer: Self-pay | Admitting: Surgical

## 2024-08-03 VITALS — BP 120/79 | HR 85

## 2024-08-03 DIAGNOSIS — Z9889 Other specified postprocedural states: Secondary | ICD-10-CM

## 2024-08-03 DIAGNOSIS — N62 Hypertrophy of breast: Secondary | ICD-10-CM

## 2024-08-03 NOTE — Progress Notes (Signed)
 Patient is a 43 year old female who presents with Spanish interpreter to discuss concerns after bilateral breast reduction with Dr. Waddell June 2025.  She has overall healed well, but has concerns about a fold of the medial left breast at the most medial portion of the inframammary fold incision.  She also has concerns about lateral breast fullness.  She also has some questions about liposuction of her abdomen.  Chaperone present on exam On exam bilateral breast incisions are intact and healing well.  There is no erythema or cellulitic changes of either breast.  She does have a fold of the left medial breast just medial to the innermost portion of the incision.  She also has some slight lateral breast fullness that extends into the axilla.  A/P:  Patient overall is healing well from breast reduction surgery in June 2025. She has concerns related to a medial left breast fold.  We discussed that this may continue to improve with time, but it is possible that she may need a revision.  Recommend discussing this further with Dr. Waddell in 3 months after gentle massage 1-2 times daily for the next 3 months.  In regards to the lateral breast fullness, discussed with patient that her area of concern is more in her axilla and is typically not addressed with a breast reduction surgery.  We discussed that she could have liposuction performed in this area, but it may improve over the next few months and to follow-up in 3 months for reevaluation.

## 2024-08-08 ENCOUNTER — Encounter: Payer: Self-pay | Admitting: Family

## 2024-08-08 ENCOUNTER — Ambulatory Visit (INDEPENDENT_AMBULATORY_CARE_PROVIDER_SITE_OTHER): Payer: Self-pay | Admitting: Family

## 2024-08-08 VITALS — BP 122/78 | HR 71 | Temp 98.0°F | Resp 16 | Ht 61.0 in | Wt 174.0 lb

## 2024-08-08 DIAGNOSIS — Z603 Acculturation difficulty: Secondary | ICD-10-CM

## 2024-08-08 DIAGNOSIS — Z758 Other problems related to medical facilities and other health care: Secondary | ICD-10-CM

## 2024-08-08 DIAGNOSIS — Z13220 Encounter for screening for lipoid disorders: Secondary | ICD-10-CM

## 2024-08-08 DIAGNOSIS — R635 Abnormal weight gain: Secondary | ICD-10-CM

## 2024-08-08 DIAGNOSIS — Z7689 Persons encountering health services in other specified circumstances: Secondary | ICD-10-CM

## 2024-08-08 DIAGNOSIS — Z760 Encounter for issue of repeat prescription: Secondary | ICD-10-CM

## 2024-08-08 DIAGNOSIS — Z6832 Body mass index (BMI) 32.0-32.9, adult: Secondary | ICD-10-CM

## 2024-08-08 MED ORDER — PHENTERMINE HCL 15 MG PO CAPS: 15.0000 mg | ORAL_CAPSULE | ORAL | 0 refills | Status: DC

## 2024-08-08 NOTE — Progress Notes (Signed)
 Weight check and cholesterol check

## 2024-08-08 NOTE — Progress Notes (Signed)
 Patient ID: Meghan Reeves, female    DOB: 03-27-1981  MRN: [id number]  CC: Weight Check  Subjective: Meghan Reeves is a 43 y.o. female who presents for weight check.   Her concerns today include:  - Doing well on Phentermine , no issues/concerns. States she prefers to continue Phentermine  15 mg due to increased dosages cause side effects.  - Cholesterol lab.  Patient Active Problem List   Diagnosis Date Noted   Musculoskeletal disorder involving upper trapezius muscle 03/24/2022   Lipoma of neck 03/24/2022   Hypertriglyceridemia 02/25/2022   Class 2 obesity due to excess calories with body mass index (BMI) of 35.0 to 35.9 in adult 02/25/2022   Bacterial vaginosis 01/09/2021   Candida vaginitis 01/09/2021   Status post laparoscopic cholecystectomy 05/14/2020   Diastasis recti 04/25/2020   Nexplanon in place 12/14/2019   Migraine without aura and with status migrainosus, not intractable 12/08/2018   Medication overuse headache 12/08/2018   Tobacco use disorder, continuous 02/03/2017   Encounter for smoking cessation counseling 02/03/2017   Spanish speaking patient 10/01/2016     Current Outpatient Medications on File Prior to Visit  Medication Sig Dispense Refill   Hydrocortisone  Acetate 1 % OINT Apply 1 Application topically daily as needed. 28.4 g 2   ibuprofen  (ADVIL ) 600 MG tablet Take 1 tablet (600 mg total) by mouth every 6 (six) hours as needed. 30 tablet 0   lidocaine  (LIDODERM ) 5 % Place 1 patch onto the skin daily. Remove & Discard patch within 12 hours or as directed by MD 30 patch 0   metoCLOPramide  (REGLAN ) 10 MG tablet TAKE 1 TABLET (10 MG TOTAL) BY MOUTH 3 (THREE) TIMES DAILY AS NEEDED FOR NAUSEA (OR HEADACHE). 15 tablet 11   ondansetron  (ZOFRAN ) 4 MG tablet Take 1 tablet (4 mg total) by mouth every 8 (eight) hours as needed for nausea or vomiting. 20 tablet 0   rizatriptan  (MAXALT ) 10 MG tablet Take 1 tablet (10 mg total) by mouth as needed for  migraine. May repeat in 2 hours if needed 10 tablet 11   phentermine  15 MG capsule Take 1 capsule (15 mg total) by mouth every morning. (Patient not taking: Reported on 08/03/2024) 30 capsule 0   phentermine  30 MG capsule Take 1 capsule (30 mg total) by mouth every morning. (Patient not taking: Reported on 08/03/2024) 30 capsule 0   phentermine  37.5 MG capsule Take 1 capsule (37.5 mg total) by mouth every morning. (Patient not taking: Reported on 08/03/2024) 30 capsule 0   No current facility-administered medications on file prior to visit.    Allergies  Allergen Reactions   Topamax  [Topiramate ] Other (See Comments)    Blurry vision, numbness of lips    Social History   Socioeconomic History   Marital status: Significant Other    Spouse name: Not on file   Number of children: 1   Years of education: Not on file   Highest education level: 6th grade  Occupational History   Not on file  Tobacco Use   Smoking status: Former    Current packs/day: 0.00    Average packs/day: 0.3 packs/day for 10.0 years (2.5 ttl pk-yrs)    Types: Cigarettes    Start date: 04/06/2008    Quit date: 04/06/2018    Years since quitting: 6.3   Smokeless tobacco: Never  Vaping Use   Vaping status: Never Used  Substance and Sexual Activity   Alcohol use: No    Alcohol/week: 0.0 standard drinks of alcohol  Drug use: No   Sexual activity: Yes    Birth control/protection: None  Other Topics Concern   Not on file  Social History Narrative   ** Merged History Encounter **       Lives at home with boyfriend  Right handed Caffeine : 30 oz daily of coca cola   Social Drivers of Corporate investment banker Strain: Low Risk  (02/15/2024)   Overall Financial Resource Strain (CARDIA)    Difficulty of Paying Living Expenses: Not hard at all  Food Insecurity: No Food Insecurity (12/14/2023)   Hunger Vital Sign    Worried About Running Out of Food in the Last Year: Never true    Ran Out of Food in the Last Year:  Never true  Transportation Needs: No Transportation Needs (02/15/2024)   PRAPARE - Administrator, Civil Service (Medical): No    Lack of Transportation (Non-Medical): No  Physical Activity: Insufficiently Active (02/15/2024)   Exercise Vital Sign    Days of Exercise per Week: 4 days    Minutes of Exercise per Session: 30 min  Stress: No Stress Concern Present (02/15/2024)   [employer] of Occupational Health - Occupational Stress Questionnaire    Feeling of Stress : Not at all  Social Connections: Moderately Isolated (02/15/2024)   Social Connection and Isolation Panel    Frequency of Communication with Friends and Family: More than three times a week    Frequency of Social Gatherings with Friends and Family: More than three times a week    Attends Religious Services: Never    Database administrator or Organizations: No    Attends Banker Meetings: Never    Marital Status: Married  Catering manager Violence: Not At Risk (02/15/2024)   Humiliation, Afraid, Rape, and Kick questionnaire    Fear of Current or Ex-Partner: No    Emotionally Abused: No    Physically Abused: No    Sexually Abused: No    Family History  Problem Relation Age of Onset   Asthma Mother    Migraines Sister     Past Surgical History:  Procedure Laterality Date   APPENDECTOMY  09/2016   CESAREAN SECTION N/A 09/07/2017   Procedure: CESAREAN SECTION;  Surgeon: Alger Gong, MD;  Location: [facility];  Service: Obstetrics;  Laterality: N/A;   LAPAROSCOPIC APPENDECTOMY N/A 09/29/2016   Procedure: APPENDECTOMY LAPAROSCOPIC;  Surgeon: Camellia Blush, MD;  Location: [facility];  Service: General;  Laterality: N/A;   NO PAST SURGERIES     VENTRAL HERNIA REPAIR N/A 04/29/2020   Procedure: HERNIA REPAIR VENTRAL ADULT, Open;  Surgeon: Lane Shope, MD;  Location: [facility];  Service: General;  Laterality: N/A;    ROS: Review of Systems Negative except as stated above  PHYSICAL  EXAM: BP 122/78   Pulse 71   Temp 98 F (36.7 C) (Oral)   Resp 16   Ht 5' 1 (1.549 m)   Wt 174 lb (78.9 kg)   LMP 07/26/2024 (Exact Date)   SpO2 96%   BMI 32.88 kg/m   Wt Readings from Last 3 Encounters:  08/08/24 174 lb (78.9 kg)  06/28/24 170 lb 6.4 oz (77.3 kg)  04/17/24 180 lb 6.4 oz (81.8 kg)   Physical Exam HENT:     Head: Normocephalic and atraumatic.     Nose: Nose normal.     Mouth/Throat:     Mouth: Mucous membranes are moist.     Pharynx: Oropharynx is clear.  Eyes:     Extraocular Movements: Extraocular movements intact.     Conjunctiva/sclera: Conjunctivae normal.     Pupils: Pupils are equal, round, and reactive to light.  Cardiovascular:     Rate and Rhythm: Normal rate and regular rhythm.     Pulses: Normal pulses.     Heart sounds: Normal heart sounds.  Pulmonary:     Effort: Pulmonary effort is normal.     Breath sounds: Normal breath sounds.  Musculoskeletal:        General: Normal range of motion.     Cervical back: Normal range of motion and neck supple.  Neurological:     General: No focal deficit present.     Mental Status: She is alert and oriented to person, place, and time.  Psychiatric:        Mood and Affect: Mood normal.        Behavior: Behavior normal.    ASSESSMENT AND PLAN: 1. Encounter for weight management (Primary) 2. BMI 32.0-32.9,adult 3. Weight gain - Patient gained 4 pounds since previous office visit.  - Continue Phentermine  as prescribed. Counseled on medication adherence/adverse effects.  - Follow-up with primary provider in 4 weeks or sooner if needed.  - phentermine  15 MG capsule; Take 1 capsule (15 mg total) by mouth every morning.  Dispense: 30 capsule; Refill: 0  4. Screening cholesterol level - Routine screening.  - Lipid panel  5. Language barrier - [employer]. ID#: [id number]   Patient was given the opportunity to ask questions.  Patient verbalized understanding of the plan and was able to  repeat key elements of the plan. Patient was given clear instructions to go to Emergency Department or return to medical center if symptoms don't improve, worsen, or new problems develop.The patient verbalized understanding.   Orders Placed This Encounter  Procedures   Lipid panel     Requested Prescriptions   Signed Prescriptions Disp Refills   phentermine  15 MG capsule 30 capsule 0    Sig: Take 1 capsule (15 mg total) by mouth every morning.    Return in about 4 weeks (around 09/05/2024) for Follow-Up or next available weight check.  Greig JINNY Drones, NP

## 2024-08-09 ENCOUNTER — Ambulatory Visit: Payer: Self-pay | Admitting: Family

## 2024-08-09 DIAGNOSIS — E785 Hyperlipidemia, unspecified: Secondary | ICD-10-CM

## 2024-08-09 LAB — LIPID PANEL
Chol/HDL Ratio: 4.2 ratio (ref 0.0–4.4)
Cholesterol, Total: 198 mg/dL (ref 100–199)
HDL: 47 mg/dL (ref >39)
LDL Chol Calc (NIH): 120 mg/dL — ABNORMAL HIGH (ref 0–99)
Triglycerides: 173 mg/dL — ABNORMAL HIGH (ref 0–149)
VLDL Cholesterol Cal: 31 mg/dL (ref 5–40)

## 2024-08-09 MED ORDER — ATORVASTATIN CALCIUM 20 MG PO TABS: 20.0000 mg | ORAL_TABLET | Freq: Every day | ORAL | 0 refills | Status: DC

## 2024-08-11 ENCOUNTER — Other Ambulatory Visit: Payer: Self-pay | Admitting: Family

## 2024-08-21 ENCOUNTER — Institutional Professional Consult (permissible substitution) (INDEPENDENT_AMBULATORY_CARE_PROVIDER_SITE_OTHER): Payer: Self-pay | Admitting: Otolaryngology

## 2024-08-29 ENCOUNTER — Telehealth (INDEPENDENT_AMBULATORY_CARE_PROVIDER_SITE_OTHER): Payer: Self-pay | Admitting: Otolaryngology

## 2024-08-29 NOTE — Telephone Encounter (Signed)
 Lvm with interpreter to r/s appointment on 09/08/2024

## 2024-08-30 ENCOUNTER — Telehealth (INDEPENDENT_AMBULATORY_CARE_PROVIDER_SITE_OTHER): Payer: Self-pay | Admitting: Otolaryngology

## 2024-08-30 NOTE — Telephone Encounter (Signed)
 Lvm with interpreter to r/s 08/30/24

## 2024-09-08 ENCOUNTER — Institutional Professional Consult (permissible substitution) (INDEPENDENT_AMBULATORY_CARE_PROVIDER_SITE_OTHER): Payer: Self-pay | Admitting: Otolaryngology

## 2024-09-12 ENCOUNTER — Other Ambulatory Visit: Payer: Self-pay

## 2024-09-12 ENCOUNTER — Ambulatory Visit (INDEPENDENT_AMBULATORY_CARE_PROVIDER_SITE_OTHER): Payer: Self-pay | Admitting: Otolaryngology

## 2024-09-12 ENCOUNTER — Encounter (INDEPENDENT_AMBULATORY_CARE_PROVIDER_SITE_OTHER): Payer: Self-pay | Admitting: Otolaryngology

## 2024-09-12 VITALS — BP 109/75 | HR 71 | Temp 97.8°F | Ht 61.0 in | Wt 163.0 lb

## 2024-09-12 DIAGNOSIS — Z87891 Personal history of nicotine dependence: Secondary | ICD-10-CM

## 2024-09-12 DIAGNOSIS — J3501 Chronic tonsillitis: Secondary | ICD-10-CM

## 2024-09-12 MED ORDER — CLINDAMYCIN HCL 300 MG PO CAPS: 300.0000 mg | ORAL_CAPSULE | Freq: Three times a day (TID) | ORAL | 0 refills | Status: AC

## 2024-09-12 NOTE — Progress Notes (Signed)
 Reason for Consult:tonsil stones Referring Physician: Dr Lorren Gallery Meghan Reeves is an 43 y.o. female.  HPI: History of tonsil stones mostly on the right side.  She has had this for several months approximately 5.  She does not have any pain.  No dysphagia or odynophagia.  No hoarseness.  The tonsil stones come and go.  It does cause her a very foul breath.  She has not had any antibiotics.  She does not get repetitive tonsillitis episodes.  Past Medical History:  Diagnosis Date   Asthma, mild    Medical history non-contributory     Past Surgical History:  Procedure Laterality Date   APPENDECTOMY  09/2016   CESAREAN SECTION N/A 09/07/2017   Procedure: CESAREAN SECTION;  Surgeon: Alger Gong, MD;  Location: [facility];  Service: Obstetrics;  Laterality: N/A;   LAPAROSCOPIC APPENDECTOMY N/A 09/29/2016   Procedure: APPENDECTOMY LAPAROSCOPIC;  Surgeon: Camellia Blush, MD;  Location: [facility];  Service: General;  Laterality: N/A;   NO PAST SURGERIES     VENTRAL HERNIA REPAIR N/A 04/29/2020   Procedure: HERNIA REPAIR VENTRAL ADULT, Open;  Surgeon: Lane Shope, MD;  Location: [facility];  Service: General;  Laterality: N/A;    Family History  Problem Relation Age of Onset   Asthma Mother    Migraines Sister     Social History:  reports that she quit smoking about 6 years ago. Her smoking use included cigarettes. She started smoking about 16 years ago. She has a 2.5 pack-year smoking history. She has never used smokeless tobacco. She reports that she does not drink alcohol and does not use drugs.  Allergies:  Allergies  Allergen Reactions   Topamax  [Topiramate ] Other (See Comments)    Blurry vision, numbness of lips    Medications: I have reviewed the patient's current medications.  No results found for this or any previous visit (from the past 48 hours).  No results found.  ROS Blood pressure 109/75, pulse 71, temperature 97.8 F (36.6 C), height 5' 1  (1.549 m), weight 163 lb (73.9 kg), SpO2 98%. Physical Exam Constitutional:      Appearance: Normal appearance.  HENT:     Head: Normocephalic and atraumatic.     Right Ear: Tympanic membrane is without lesions and middle ear aerated, ear canal and external ear normal.     Left Ear: Tympanic membrane is without lesions and middle ear aerated, ear canal and external ear normal.     Nose: Nose normal. Turbinates with mild hypertrophy, No significant swelling or masses.     Oral cavity/oropharynx: Tonsils are cryptic bilaterally but no active or obvious debris currently.  Mucous membranes are moist. No lesions or masses    Larynx: normal voice. Mirror attempted without success    Eyes:     Extraocular Movements: Extraocular movements intact.     Conjunctiva/sclera: Conjunctivae normal.     Pupils: Pupils are equal, round, and reactive to light.  Cardiovascular:     Rate and Rhythm: Normal rate.  Pulmonary:     Effort: Pulmonary effort is normal.  Musculoskeletal:     Cervical back: Normal range of motion and neck supple. No rigidity.  Lymphadenopathy:     Cervical: No cervical adenopathy or masses.salivary glands without lesions. .  Neurological:     Mental Status: He is alert. CN 2-12 intact. 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  I will  try clindamycin  for 10 days.  If she is not improved then we will try another round of antibiotics.  At that point if it still persist she will need a discussion about tonsillectomy.  Norleen Notice 09/12/2024, 8:50 AM

## 2024-09-18 ENCOUNTER — Telehealth: Payer: Self-pay | Admitting: Family

## 2024-09-18 NOTE — Telephone Encounter (Addendum)
 Pt came to the office to reschedule appt due to work. Pt's appt got rescheduled to next available appt in 12/18. Pt is requesting refill for phentermine  to hold her until scheduled appt. Pt also wants to know if there are any contraindications with her new medication, clindamycin . Please advise.

## 2024-09-18 NOTE — Telephone Encounter (Signed)
-   Schedule weight check for refill of Phentermine . Patient may consider scheduling an appointment with another provider at our office with sooner appointment if agreeable.  - Continue with Clindamycin  as prescribed by Roark Rush, MD on (09/12/2024  8:53 AM EST).

## 2024-09-20 ENCOUNTER — Ambulatory Visit: Payer: Self-pay | Admitting: Family

## 2024-09-20 NOTE — Telephone Encounter (Signed)
 Noted

## 2024-10-26 ENCOUNTER — Ambulatory Visit: Payer: Self-pay | Admitting: Family

## 2024-10-26 VITALS — BP 112/76 | HR 72 | Temp 97.8°F | Resp 16 | Ht 61.0 in | Wt 176.4 lb

## 2024-10-26 DIAGNOSIS — R635 Abnormal weight gain: Secondary | ICD-10-CM

## 2024-10-26 DIAGNOSIS — Z603 Acculturation difficulty: Secondary | ICD-10-CM

## 2024-10-26 DIAGNOSIS — E785 Hyperlipidemia, unspecified: Secondary | ICD-10-CM

## 2024-10-26 DIAGNOSIS — Z758 Other problems related to medical facilities and other health care: Secondary | ICD-10-CM

## 2024-10-26 MED ORDER — PHENTERMINE HCL 37.5 MG PO CAPS: 37.5000 mg | ORAL_CAPSULE | ORAL | 0 refills | Status: DC

## 2024-10-26 NOTE — Progress Notes (Signed)
 Patient ID: Meghan Reeves, female    DOB: 02-Jun-1981  MRN: [id number]  CC: Weight Check   Subjective: Meghan Reeves is a 43 y.o. female who presents for weight check.   Her concerns today include:  - Doing well on Phentermine , no issues/concerns. She would like to remain on Phentermine  15 mg dose. States she is consuming protein shakes. She watches what she eats. She exercises.  - Doing well on Atorvastatin , no issues/concerns.   Patient Active Problem List   Diagnosis Date Noted   Musculoskeletal disorder involving upper trapezius muscle 03/24/2022   Lipoma of neck 03/24/2022   Hypertriglyceridemia 02/25/2022   Class 2 obesity due to excess calories with body mass index (BMI) of 35.0 to 35.9 in adult 02/25/2022   Bacterial vaginosis 01/09/2021   Candida vaginitis 01/09/2021   Status post laparoscopic cholecystectomy 05/14/2020   Diastasis recti 04/25/2020   Nexplanon in place 12/14/2019   Migraine without aura and with status migrainosus, not intractable 12/08/2018   Medication overuse headache 12/08/2018   Tobacco use disorder, continuous 02/03/2017   Encounter for smoking cessation counseling 02/03/2017   Spanish speaking patient 10/01/2016     Medications Ordered Prior to Encounter[1]  Allergies[2]  Social History   Socioeconomic History   Marital status: Significant Other    Spouse name: Not on file   Number of children: 1   Years of education: Not on file   Highest education level: 6th grade  Occupational History   Not on file  Tobacco Use   Smoking status: Former    Current packs/day: 0.00    Average packs/day: 0.3 packs/day for 10.0 years (2.5 ttl pk-yrs)    Types: Cigarettes    Start date: 04/06/2008    Quit date: 04/06/2018    Years since quitting: 6.5   Smokeless tobacco: Never  Vaping Use   Vaping status: Never Used  Substance and Sexual Activity   Alcohol use: No    Alcohol/week: 0.0 standard drinks of alcohol   Drug use: No    Sexual activity: Yes    Birth control/protection: None  Other Topics Concern   Not on file  Social History Narrative   ** Merged History Encounter **       Lives at home with boyfriend  Right handed Caffeine : 30 oz daily of coca cola   Social Drivers of Health   Tobacco Use: Medium Risk (10/26/2024)   Patient History    Smoking Tobacco Use: Former    Smokeless Tobacco Use: Never    Passive Exposure: Not on Actuary Strain: Low Risk (02/15/2024)   Overall Financial Resource Strain (CARDIA)    Difficulty of Paying Living Expenses: Not hard at all  Food Insecurity: No Food Insecurity (12/14/2023)   Hunger Vital Sign    Worried About Running Out of Food in the Last Year: Never true    Ran Out of Food in the Last Year: Never true  Transportation Needs: No Transportation Needs (02/15/2024)   PRAPARE - Administrator, Civil Service (Medical): No    Lack of Transportation (Non-Medical): No  Physical Activity: Insufficiently Active (02/15/2024)   Exercise Vital Sign    Days of Exercise per Week: 4 days    Minutes of Exercise per Session: 30 min  Stress: No Stress Concern Present (02/15/2024)   [employer] of Occupational Health - Occupational Stress Questionnaire    Feeling of Stress : Not at all  Social Connections: Moderately Isolated (02/15/2024)  Social Connection and Isolation Panel    Frequency of Communication with Friends and Family: More than three times a week    Frequency of Social Gatherings with Friends and Family: More than three times a week    Attends Religious Services: Never    Database Administrator or Organizations: No    Attends Banker Meetings: Never    Marital Status: Married  Catering Manager Violence: Not At Risk (02/15/2024)   Humiliation, Afraid, Rape, and Kick questionnaire    Fear of Current or Ex-Partner: No    Emotionally Abused: No    Physically Abused: No    Sexually Abused: No  Depression (PHQ2-9): Low  Risk (10/26/2024)   Depression (PHQ2-9)    PHQ-2 Score: 0  Alcohol Screen: Low Risk (02/15/2024)   Alcohol Screen    Last Alcohol Screening Score (AUDIT): 0  Housing: Unknown (08/08/2024)   Epic    Unable to Pay for Housing in the Last Year: No    Number of Times Moved in the Last Year: Not on file    Homeless in the Last Year: No  Utilities: Not At Risk (02/15/2024)   [facility] Utilities    Threatened with loss of utilities: No  Health Literacy: Inadequate Health Literacy (02/15/2024)   B1300 Health Literacy    Frequency of need for help with medical instructions: Always    Family History  Problem Relation Age of Onset   Asthma Mother    Migraines Sister     Past Surgical History:  Procedure Laterality Date   APPENDECTOMY  09/2016   CESAREAN SECTION N/A 09/07/2017   Procedure: CESAREAN SECTION;  Surgeon: Alger Gong, MD;  Location: [facility];  Service: Obstetrics;  Laterality: N/A;   LAPAROSCOPIC APPENDECTOMY N/A 09/29/2016   Procedure: APPENDECTOMY LAPAROSCOPIC;  Surgeon: Camellia Blush, MD;  Location: [facility];  Service: General;  Laterality: N/A;   NO PAST SURGERIES     VENTRAL HERNIA REPAIR N/A 04/29/2020   Procedure: HERNIA REPAIR VENTRAL ADULT, Open;  Surgeon: Lane Shope, MD;  Location: [facility];  Service: General;  Laterality: N/A;    ROS: Review of Systems Negative except as stated above  PHYSICAL EXAM: BP 112/76   Pulse 72   Temp 97.8 F (36.6 C) (Oral)   Resp 16   Ht 5' 1 (1.549 m)   Wt 176 lb 6.4 oz (80 kg)   LMP 10/18/2024 (Exact Date)   SpO2 97%   BMI 33.33 kg/m   Wt Readings from Last 3 Encounters:  10/26/24 176 lb 6.4 oz (80 kg)  09/12/24 163 lb (73.9 kg)  08/08/24 174 lb (78.9 kg)   Physical Exam HENT:     Head: Normocephalic and atraumatic.     Nose: Nose normal.     Mouth/Throat:     Mouth: Mucous membranes are moist.     Pharynx: Oropharynx is clear.  Eyes:     Extraocular Movements: Extraocular movements intact.      Conjunctiva/sclera: Conjunctivae normal.     Pupils: Pupils are equal, round, and reactive to light.  Cardiovascular:     Rate and Rhythm: Normal rate and regular rhythm.     Pulses: Normal pulses.     Heart sounds: Normal heart sounds.  Pulmonary:     Effort: Pulmonary effort is normal.     Breath sounds: Normal breath sounds.  Musculoskeletal:        General: Normal range of motion.     Cervical back: Normal range of  motion and neck supple.  Neurological:     General: No focal deficit present.     Mental Status: She is alert and oriented to person, place, and time.  Psychiatric:        Mood and Affect: Mood normal.        Behavior: Behavior normal.     ASSESSMENT AND PLAN: 1. Weight gain (Primary) - Patient gained 13 pounds since previous office visit.  - Continue Phentermine  as prescribed. Counseled on medication adherence/adverse effects.  - Follow-up with primary provider in 4 weeks or sooner if needed.  - phentermine  37.5 MG capsule; Take 1 capsule (37.5 mg total) by mouth every morning.  Dispense: 30 capsule; Refill: 0  2. Hyperlipidemia, unspecified hyperlipidemia type - Continue Atorvastatin  as prescribed. Counseled on medication adherence/adverse effects.  - Routine screening.  - Follow-up with primary provider as scheduled. - atorvastatin  (LIPITOR) 20 MG tablet; Take 1 tablet (20 mg total) by mouth daily.  Dispense: 90 tablet; Refill: 0 - Lipid panel; Future  3. Language barrier - [facility]. ID#: [id number]    Patient was given the opportunity to ask questions.  Patient verbalized understanding of the plan and was able to repeat key elements of the plan. Patient was given clear instructions to go to Emergency Department or return to medical center if symptoms don't improve, worsen, or new problems develop.The patient verbalized understanding.   Orders Placed This Encounter  Procedures   Lipid panel     Requested Prescriptions   Signed  Prescriptions Disp Refills   phentermine  37.5 MG capsule 30 capsule 0    Sig: Take 1 capsule (37.5 mg total) by mouth every morning.   atorvastatin  (LIPITOR) 20 MG tablet 90 tablet 0    Sig: Take 1 tablet (20 mg total) by mouth daily.    Return in about 4 weeks (around 11/23/2024) for Follow-Up or next available weight check .  Greig JINNY Chute, NP      [1]  Current Outpatient Medications on File Prior to Visit  Medication Sig Dispense Refill   Hydrocortisone  Acetate 1 % OINT Apply 1 Application topically daily as needed. 28.4 g 2   ibuprofen  (ADVIL ) 600 MG tablet Take 1 tablet (600 mg total) by mouth every 6 (six) hours as needed. 30 tablet 0   lidocaine  (LIDODERM ) 5 % Place 1 patch onto the skin daily. Remove & Discard patch within 12 hours or as directed by MD 30 patch 0   metoCLOPramide  (REGLAN ) 10 MG tablet TAKE 1 TABLET (10 MG TOTAL) BY MOUTH 3 (THREE) TIMES DAILY AS NEEDED FOR NAUSEA (OR HEADACHE). 15 tablet 11   ondansetron  (ZOFRAN ) 4 MG tablet Take 1 tablet (4 mg total) by mouth every 8 (eight) hours as needed for nausea or vomiting. 20 tablet 0   phentermine  15 MG capsule Take 1 capsule (15 mg total) by mouth every morning. 30 capsule 0   rizatriptan  (MAXALT ) 10 MG tablet Take 1 tablet (10 mg total) by mouth as needed for migraine. May repeat in 2 hours if needed 10 tablet 11   phentermine  15 MG capsule Take 1 capsule (15 mg total) by mouth every morning. (Patient not taking: Reported on 08/03/2024) 30 capsule 0   phentermine  30 MG capsule Take 1 capsule (30 mg total) by mouth every morning. (Patient not taking: Reported on 08/03/2024) 30 capsule 0   No current facility-administered medications on file prior to visit.  [2]  Allergies Allergen Reactions   Topamax  [Topiramate ] Other (See Comments)  Blurry vision, numbness of lips

## 2024-10-26 NOTE — Progress Notes (Signed)
 Weight check

## 2024-11-06 ENCOUNTER — Ambulatory Visit: Payer: Self-pay | Admitting: Plastic Surgery

## 2024-11-06 VITALS — BP 101/71 | HR 98

## 2024-11-06 DIAGNOSIS — E65 Localized adiposity: Secondary | ICD-10-CM

## 2024-11-06 DIAGNOSIS — Z9889 Other specified postprocedural states: Secondary | ICD-10-CM

## 2024-11-06 DIAGNOSIS — M793 Panniculitis, unspecified: Secondary | ICD-10-CM

## 2024-11-06 DIAGNOSIS — L905 Scar conditions and fibrosis of skin: Secondary | ICD-10-CM

## 2024-11-06 NOTE — Progress Notes (Signed)
 Meghan Reeves returns today 6 months postop from a bilateral breast reduction for evaluation.  We have an interpreter with us  today for the entire exam.  Meghan Reeves states that she is having pain along the medial inframammary incision on the left.  This pain worsens with arm extension over the head.  She also notes excess skin and fat on her anterior abdominal wall which is frequently infected with rashes.  She is interested in having this addressed.  Breast: She has good shape and symmetry bilaterally.  There is some scarring medially but I cannot feel any masses or any thing that would contribute to her discomfort.  Abdomen: She has a large pannus which extends past the symphysis pubis.  Covers the bilateral inguinal creases.  Status post breast reduction: I do not feel anything on physical examination however I will order a mammogram for evaluation.  I have discussed continued scar massage, watchful waiting, and silicone tape with her.  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  I showed her location of the incisions and we discussed the need for postoperative drains and compression.  We discussed the risks of bleeding, infection, and seroma formation.  I will send her a quote for panniculectomy.  I have encouraged her to continue working on weight loss and we discussed strategies for improving her diet and for adding body weight resistance training.  I will obtain photographs at her next appointment.  Will send her a quote for panniculectomy.  I spent 30 minutes examining the patient, discussing panniculectomy's and scar management, coordinating care, and documenting.  She will follow-up with me a week to 10 days after the mammogram is complete

## 2024-11-07 ENCOUNTER — Other Ambulatory Visit: Payer: Self-pay | Admitting: Plastic Surgery

## 2024-11-07 DIAGNOSIS — L905 Scar conditions and fibrosis of skin: Secondary | ICD-10-CM

## 2024-11-07 DIAGNOSIS — Z9889 Other specified postprocedural states: Secondary | ICD-10-CM

## 2024-11-15 ENCOUNTER — Telehealth: Payer: Self-pay | Admitting: Family

## 2024-11-15 NOTE — Telephone Encounter (Signed)
 Pt stated she has received phentermine  37mg  at the pharmacy but wants 15mg  dosage instead. Please advise; pt wants CB

## 2024-11-17 ENCOUNTER — Other Ambulatory Visit: Payer: Self-pay | Admitting: Family

## 2024-11-17 DIAGNOSIS — R635 Abnormal weight gain: Secondary | ICD-10-CM

## 2024-11-17 MED ORDER — PHENTERMINE HCL 15 MG PO CAPS: 15.0000 mg | ORAL_CAPSULE | ORAL | 0 refills | Status: AC

## 2024-11-17 NOTE — Telephone Encounter (Signed)
 Complete

## 2024-12-05 ENCOUNTER — Encounter: Payer: Self-pay | Admitting: Family

## 2024-12-05 NOTE — Progress Notes (Signed)
 Erroneous encounter-disregard

## 2025-01-10 ENCOUNTER — Ambulatory Visit: Payer: Self-pay | Admitting: Plastic Surgery
